# Patient Record
Sex: Male | Born: 1950
Health system: Southern US, Community
[De-identification: ages and names within clinical notes are randomized; demographics above are authoritative.]

## PROBLEM LIST (undated history)

## (undated) DIAGNOSIS — M7500 Adhesive capsulitis of unspecified shoulder: Secondary | ICD-10-CM

## (undated) DIAGNOSIS — E785 Hyperlipidemia, unspecified: Secondary | ICD-10-CM

## (undated) DIAGNOSIS — Z87442 Personal history of urinary calculi: Secondary | ICD-10-CM

## (undated) DIAGNOSIS — M1990 Unspecified osteoarthritis, unspecified site: Secondary | ICD-10-CM

## (undated) DIAGNOSIS — K219 Gastro-esophageal reflux disease without esophagitis: Secondary | ICD-10-CM

## (undated) DIAGNOSIS — C679 Malignant neoplasm of bladder, unspecified: Secondary | ICD-10-CM

## (undated) DIAGNOSIS — T7840XA Allergy, unspecified, initial encounter: Secondary | ICD-10-CM

## (undated) DIAGNOSIS — N189 Chronic kidney disease, unspecified: Secondary | ICD-10-CM

## (undated) DIAGNOSIS — E213 Hyperparathyroidism, unspecified: Secondary | ICD-10-CM

## (undated) DIAGNOSIS — A159 Respiratory tuberculosis unspecified: Secondary | ICD-10-CM

## (undated) DIAGNOSIS — I10 Essential (primary) hypertension: Secondary | ICD-10-CM

## (undated) HISTORY — DX: Hyperlipidemia, unspecified: E78.5

## (undated) HISTORY — PX: POLYPECTOMY: SHX149

## (undated) HISTORY — DX: Respiratory tuberculosis unspecified: A15.9

## (undated) HISTORY — DX: Allergy, unspecified, initial encounter: T78.40XA

## (undated) HISTORY — DX: Essential (primary) hypertension: I10

## (undated) HISTORY — PX: COLONOSCOPY: SHX174

## (undated) HISTORY — DX: Adhesive capsulitis of unspecified shoulder: M75.00

## (undated) HISTORY — PX: WISDOM TOOTH EXTRACTION: SHX21

## (undated) HISTORY — DX: Chronic kidney disease, unspecified: N18.9

## (undated) HISTORY — DX: Gastro-esophageal reflux disease without esophagitis: K21.9

## (undated) HISTORY — DX: Unspecified osteoarthritis, unspecified site: M19.90

---

## 2012-12-04 HISTORY — PX: PROSTATE SURGERY: SHX751

## 2014-11-06 ENCOUNTER — Encounter: Payer: Self-pay | Admitting: Internal Medicine

## 2014-11-06 ENCOUNTER — Ambulatory Visit (INDEPENDENT_AMBULATORY_CARE_PROVIDER_SITE_OTHER): Payer: 59 | Admitting: Internal Medicine

## 2014-11-06 ENCOUNTER — Other Ambulatory Visit: Payer: Self-pay

## 2014-11-06 VITALS — BP 128/82 | HR 53 | Temp 97.8°F | Resp 14 | Ht 69.0 in | Wt 222.4 lb

## 2014-11-06 DIAGNOSIS — Z0000 Encounter for general adult medical examination without abnormal findings: Secondary | ICD-10-CM | POA: Insufficient documentation

## 2014-11-06 DIAGNOSIS — M150 Primary generalized (osteo)arthritis: Secondary | ICD-10-CM

## 2014-11-06 DIAGNOSIS — Z23 Encounter for immunization: Secondary | ICD-10-CM

## 2014-11-06 DIAGNOSIS — E785 Hyperlipidemia, unspecified: Secondary | ICD-10-CM

## 2014-11-06 DIAGNOSIS — I10 Essential (primary) hypertension: Secondary | ICD-10-CM

## 2014-11-06 DIAGNOSIS — M159 Polyosteoarthritis, unspecified: Secondary | ICD-10-CM

## 2014-11-06 DIAGNOSIS — R55 Syncope and collapse: Secondary | ICD-10-CM | POA: Insufficient documentation

## 2014-11-06 DIAGNOSIS — K219 Gastro-esophageal reflux disease without esophagitis: Secondary | ICD-10-CM | POA: Insufficient documentation

## 2014-11-06 DIAGNOSIS — M1990 Unspecified osteoarthritis, unspecified site: Secondary | ICD-10-CM | POA: Insufficient documentation

## 2014-11-06 DIAGNOSIS — Z299 Encounter for prophylactic measures, unspecified: Secondary | ICD-10-CM

## 2014-11-06 DIAGNOSIS — Z418 Encounter for other procedures for purposes other than remedying health state: Secondary | ICD-10-CM

## 2014-11-06 MED ORDER — POTASSIUM CHLORIDE CRYS ER 20 MEQ PO TBCR: 20.0000 meq | EXTENDED_RELEASE_TABLET | Freq: Every day | ORAL | Status: DC

## 2014-11-06 NOTE — Assessment & Plan Note (Signed)
Continue lovastatin. Check lipid panel today and may need adjustment of medication and/or dosing.

## 2014-11-06 NOTE — Assessment & Plan Note (Signed)
BP at goal on lisinopril hydrochlorothiazide. We'll check labs today as he has been off his potassium to see if he continues to need potassium pills.

## 2014-11-06 NOTE — Patient Instructions (Signed)
Patient discharged home during computer downtime, instructions given via verbal order.

## 2014-11-06 NOTE — Assessment & Plan Note (Signed)
Patient up-to-date on colonoscopy. Will check lipid panel, BMP, hemoglobin A1c today. Ordered EKG however unable to be performed so will obtain on Monday.

## 2014-11-06 NOTE — Progress Notes (Signed)
   Subjective:    Patient ID: Hector [name], male    DOB: 11-13-1951, 63 y.o.   MRN: [id number]  HPI The patient is a 63 year old man who comes in to establish care. He has a past medical history arthritis, hypertension, hyperlipidemia, CK D. He is not having any new problems at this time. He has been getting a few muscle aches and attributes that to being out of his potassium pills while still taking his blood pressure medication. He denies any chest pains, shortness of breath. Over the past 2 months he has had 2 episodes where he has had syncope. During one episode he had had a gastroenteritis for the previous 48 hours and was very dehydrated and passed out and fell. The other time he is less able to identify a trigger however he was working, was sleeping and was awakened from sleep suddenly stood up and passed out on the floor. He awoke and was immediately aware of what had happened. He denies any heart palpitations, history of sudden death in the family.  Review of Systems  Constitutional: Negative for fever, activity change, appetite change, fatigue and unexpected weight change.  HENT: Negative.   Eyes: Negative.   Respiratory: Negative for cough, chest tightness, shortness of breath and wheezing.   Cardiovascular: Negative for chest pain, palpitations and leg swelling.  Gastrointestinal: Negative for nausea, abdominal pain, diarrhea, constipation and abdominal distention.  Musculoskeletal: Positive for arthralgias. Negative for back pain and joint swelling.  Skin: Negative.   Neurological: Positive for syncope. Negative for dizziness, seizures, weakness, light-headedness and headaches.      Objective:   Physical Exam  Constitutional: He is oriented to person, place, and time. He appears well-developed and well-nourished.  HENT:  Head: Normocephalic and atraumatic.  Eyes: EOM are normal.  Neck: Normal range of motion.  Cardiovascular: Normal rate and regular rhythm.   No murmur  heard. Pulmonary/Chest: Effort normal and breath sounds normal. No respiratory distress. He has no wheezes. He has no rales.  Abdominal: Soft. Bowel sounds are normal. He exhibits no distension. There is no tenderness. There is no rebound.  Musculoskeletal: He exhibits no edema.  Neurological: He is alert and oriented to person, place, and time. No cranial nerve deficit. Coordination normal.  Skin: Skin is warm and dry.   Filed Vitals:   11/06/14 0944  BP: 128/82  Pulse: 53  Temp: 97.8 F (36.6 C)  TempSrc: Oral  Resp: 14  Height: 5\' 9"  (1.753 m)  Weight: 222 lb 6.4 oz (100.88 kg)  SpO2: 97%   EKG ordered however due to computer downtime were unable to complete. Patient asked to return on Monday to get EKG done.    Assessment & Plan:

## 2014-11-06 NOTE — Progress Notes (Signed)
Pre visit review using our clinic review tool, if applicable. No additional management support is needed unless otherwise documented below in the visit note. 

## 2014-11-06 NOTE — Assessment & Plan Note (Signed)
Patient doing well with occasional meloxicam usage.

## 2014-11-06 NOTE — Assessment & Plan Note (Signed)
Patient uses rare Zantac for GERD.

## 2014-11-06 NOTE — Assessment & Plan Note (Signed)
Patient able to describe to clear episodes of syncope. One is clearly orthostatic after episode of gastroenteritis and likely dehydration. The other episode is less defined and may possibly be vasovagal from the sudden awakening and activation of adrenaline. Ordered EKG which will be done on Monday due to computer downtime. If any abnormalities will refer to cardiology and may need event monitor. No family history of sudden death and no previous history of syncope in his lifetime.

## 2014-11-09 ENCOUNTER — Other Ambulatory Visit (INDEPENDENT_AMBULATORY_CARE_PROVIDER_SITE_OTHER): Payer: 59

## 2014-11-09 DIAGNOSIS — Z0000 Encounter for general adult medical examination without abnormal findings: Secondary | ICD-10-CM

## 2014-11-09 LAB — BASIC METABOLIC PANEL
BUN: 18 mg/dL (ref 6–23)
CHLORIDE: 103 meq/L (ref 96–112)
CO2: 28 meq/L (ref 19–32)
Calcium: 10.7 mg/dL — ABNORMAL HIGH (ref 8.4–10.5)
Creatinine, Ser: 1.2 mg/dL (ref 0.4–1.5)
GFR: 82.08 mL/min (ref >60.00)
GLUCOSE: 77 mg/dL (ref 70–99)
POTASSIUM: 4.2 meq/L (ref 3.5–5.1)
SODIUM: 139 meq/L (ref 135–145)

## 2014-11-09 LAB — LIPID PANEL
CHOL/HDL RATIO: 3
Cholesterol: 126 mg/dL (ref 0–200)
HDL: 43.3 mg/dL (ref >39.00)
LDL Cholesterol: 71 mg/dL (ref 0–99)
NonHDL: 82.7
Triglycerides: 57 mg/dL (ref 0.0–149.0)
VLDL: 11.4 mg/dL (ref 0.0–40.0)

## 2014-11-09 LAB — HEMOGLOBIN A1C: HEMOGLOBIN A1C: 6.2 % (ref 4.6–6.5)

## 2014-11-10 MED ORDER — LOVASTATIN 40 MG PO TABS: 20.0000 mg | ORAL_TABLET | Freq: Every day | ORAL | Status: DC

## 2014-11-10 MED ORDER — LISINOPRIL-HYDROCHLOROTHIAZIDE 20-12.5 MG PO TABS: 1.0000 | ORAL_TABLET | Freq: Two times a day (BID) | ORAL | Status: DC

## 2014-11-10 MED ORDER — POTASSIUM CHLORIDE CRYS ER 20 MEQ PO TBCR: 20.0000 meq | EXTENDED_RELEASE_TABLET | Freq: Every day | ORAL | Status: DC

## 2014-11-10 NOTE — Addendum Note (Signed)
Addended by: Vertell Novak A on: 11/10/2014 09:33 AM   Modules accepted: Orders

## 2014-11-10 NOTE — Addendum Note (Signed)
Addended by: Resa Miner R on: 11/10/2014 09:27 AM   Modules accepted: Orders

## 2014-11-30 ENCOUNTER — Encounter: Payer: Self-pay | Admitting: Family

## 2014-11-30 ENCOUNTER — Ambulatory Visit (INDEPENDENT_AMBULATORY_CARE_PROVIDER_SITE_OTHER): Payer: 59 | Admitting: Family

## 2014-11-30 VITALS — BP 158/110 | HR 64 | Temp 97.8°F | Resp 16 | Ht 69.0 in | Wt 222.6 lb

## 2014-11-30 DIAGNOSIS — M25512 Pain in left shoulder: Secondary | ICD-10-CM

## 2014-11-30 DIAGNOSIS — M7590 Shoulder lesion, unspecified, unspecified shoulder: Secondary | ICD-10-CM | POA: Insufficient documentation

## 2014-11-30 MED ORDER — METHYLPREDNISOLONE (PAK) 4 MG PO TABS: ORAL_TABLET | ORAL | Status: DC

## 2014-11-30 MED ORDER — METHOCARBAMOL 500 MG PO TABS: 500.0000 mg | ORAL_TABLET | Freq: Four times a day (QID) | ORAL | Status: DC

## 2014-11-30 NOTE — Assessment & Plan Note (Signed)
Symptoms are most likely related to osteoarthritis, however patient does have muscle spasm of left shoulder noted. Continue taking the meloxicam as previously prescribed. Start Robaxin as needed. Start Medrol Dosepak for inflammation. If symptoms do not improve with current treatments, consider imaging and referral to sports medicine for potential injection.

## 2014-11-30 NOTE — Progress Notes (Signed)
Pre visit review using our clinic review tool, if applicable. No additional management support is needed unless otherwise documented below in the visit note. 

## 2014-11-30 NOTE — Patient Instructions (Addendum)
Thank you for choosing [facility].  Summary/Instructions:  Your prescription(s) have been submitted to your pharmacy. Please take as directed and contact our office if you believe you are having problem(s) with the medication(s).  If your symptoms worsen or fail to improve, please contact our office for further instruction, or in case of emergency go directly to the emergency room at the closest medical facility.   Continue to ice and heat as needed. Continue the Mobic as prescribed. Continue OTC icy/hots or Bengay.

## 2014-11-30 NOTE — Progress Notes (Signed)
   Subjective:    Patient ID: Hector [name], male    DOB: 11-16-1951, 63 y.o.   MRN: [id number]  Chief Complaint  Patient presents with  . Shoulder Pain    left pain x 4 days    HPI:  Hector [name] is a 63 y.o. male who presents today for an acute visit.  Acute symptoms of generalized left shoulder pain started about 4 days ago. Indicates he did have a fainting spell before Thanksgiving, but did not have pain at the time. Describes the pain as achy and at worst is about a 9/10. It is worse first thing in the morning and laying flat and trying to get up. Has tried Bengay and Tylenol which have provided minimal relief. Denies any trauma or incidence to either shoulder. Denies any sounds or sensations heard or felt. Has a history of arthritis in the left shoulder.   Allergies  Allergen Reactions  . Shellfish Allergy Hives    Current Outpatient Prescriptions on File Prior to Visit  Medication Sig Dispense Refill  . aspirin 81 MG tablet Take 81 mg by mouth daily.    . cetirizine (ZYRTEC) 10 MG tablet Take 10 mg by mouth daily.    . Cholecalciferol (VITAMIN D3) 5000 UNITS CAPS Take by mouth.    Marland Kitchen lisinopril-hydrochlorothiazide (PRINZIDE,ZESTORETIC) 20-12.5 MG per tablet Take 1 tablet by mouth 2 (two) times daily. 180 tablet 3  . lovastatin (MEVACOR) 40 MG tablet Take 0.5 tablets (20 mg total) by mouth at bedtime. 45 tablet 3  . meloxicam (MOBIC) 15 MG tablet Take 15 mg by mouth daily.    . potassium chloride SA (K-DUR,KLOR-CON) 20 MEQ tablet Take 1 tablet (20 mEq total) by mouth daily. 90 tablet 3  . ranitidine (ZANTAC) 300 MG tablet Take 150 mg by mouth 2 (two) times daily.    Marland Kitchen terazosin (HYTRIN) 10 MG capsule Take 10 mg by mouth at bedtime.     No current facility-administered medications on file prior to visit.    Review of Systems    See HPI  Objective:    BP 158/110 mmHg  Pulse 64  Temp(Src) 97.8 F (36.6 C) (Oral)  Resp 16  Ht 5\' 9"  (1.753 m)  Wt 222 lb 9.6 oz  (100.971 kg)  BMI 32.86 kg/m2  SpO2 99% Nursing note and vital signs reviewed.  Physical Exam  Constitutional: He is oriented to person, place, and time. He appears well-developed and well-nourished. No distress.  Cardiovascular: Normal rate, regular rhythm, normal heart sounds and intact distal pulses.   Pulmonary/Chest: Effort normal and breath sounds normal.  Musculoskeletal:  No obvious deformity, discoloration, or edema noted of left shoulder. Denies tenderness of cervical spine. Tenderness elicited along the upper trapezius and along course of supraspinatus muscle. Range of motion is intact and appropriate bilaterally. Pulses are present intact bilaterally. Negative apprehension, Neer's impingement and empty can.   Neurological: He is alert and oriented to person, place, and time.  Skin: Skin is warm and dry.  Psychiatric: He has a normal mood and affect. His behavior is normal. Judgment and thought content normal.       Assessment & Plan:

## 2014-12-29 ENCOUNTER — Ambulatory Visit (INDEPENDENT_AMBULATORY_CARE_PROVIDER_SITE_OTHER): Admitting: Internal Medicine

## 2014-12-29 ENCOUNTER — Encounter: Payer: Self-pay | Admitting: Internal Medicine

## 2014-12-29 VITALS — BP 120/90 | HR 62 | Temp 98.7°F | Resp 18 | Ht 69.0 in | Wt 221.0 lb

## 2014-12-29 DIAGNOSIS — R1310 Dysphagia, unspecified: Secondary | ICD-10-CM

## 2014-12-29 DIAGNOSIS — K219 Gastro-esophageal reflux disease without esophagitis: Secondary | ICD-10-CM

## 2014-12-29 DIAGNOSIS — R1314 Dysphagia, pharyngoesophageal phase: Secondary | ICD-10-CM | POA: Diagnosis not present

## 2014-12-29 MED ORDER — PANTOPRAZOLE SODIUM 40 MG PO TBEC: 40.0000 mg | DELAYED_RELEASE_TABLET | Freq: Every day | ORAL | Status: DC

## 2014-12-29 NOTE — Progress Notes (Signed)
Pre visit review using our clinic review tool, if applicable. No additional management support is needed unless otherwise documented below in the visit note. 

## 2014-12-29 NOTE — Assessment & Plan Note (Signed)
Concern for damage from GERD and/or narrowing of the esophagus. Refer to GI for possible EGD. Start more aggressive treatment of his GERD.

## 2014-12-29 NOTE — Progress Notes (Signed)
   Subjective:    Patient ID: Hector [name], male    DOB: [birth year], 64 y.o.   MRN: [id number]  HPI The patient is a 64 YO man who is coming in to follow up on his stomach troubles. He was hospitalized at the [facility] for the same vomiting and it has improved but he is still feeling like foods get stuck in his throat and he needs to vomit them up. He has changed his diet to include only soft foods and this has helped. He still has a lot of gas in his stomach. He is only using ranitidine sometimes and isn't sure that it helps. He denies weight loss, blood in his stool. He is not having diarrhea.   Review of Systems  Constitutional: Positive for appetite change. Negative for fever, activity change, fatigue and unexpected weight change.  Respiratory: Negative for cough, chest tightness, shortness of breath and wheezing.   Cardiovascular: Negative for chest pain, palpitations and leg swelling.  Gastrointestinal: Positive for nausea and abdominal distention. Negative for abdominal pain, diarrhea and constipation.       Dysphagia  Musculoskeletal: Positive for arthralgias.       Mild in left shoulder      Objective:   Physical Exam  Constitutional: He is oriented to person, place, and time. He appears well-developed and well-nourished.  HENT:  Head: Normocephalic and atraumatic.  Eyes: EOM are normal.  Neck: Normal range of motion.  Cardiovascular: Normal rate and regular rhythm.   Pulmonary/Chest: Effort normal and breath sounds normal. No respiratory distress. He has no wheezes.  Abdominal: Soft. Bowel sounds are normal. He exhibits no distension. There is no tenderness. There is no rebound.  Neurological: He is alert and oriented to person, place, and time. Coordination normal.  Skin: Skin is warm and dry.   Filed Vitals:   12/29/14 1029  BP: 120/90  Pulse: 62  Temp: 98.7 F (37.1 C)  TempSrc: Oral  Resp: 18  Height: 5\' 9"  (1.753 m)  Weight: 221 lb (100.245 kg)  SpO2: 98%        Assessment & Plan:

## 2014-12-29 NOTE — Assessment & Plan Note (Signed)
Will ask patient to start taking PPI daily as previously he stated he only used zantac rarely. Given his symptoms will increase therapy.

## 2014-12-29 NOTE — Patient Instructions (Signed)
We have sent in a medicine called protonix which you will take once a day. We will also send you to the stomach doctor to see if they need to do the upper endoscopy. If they find a problem usually they can fix it while doing the procedure.  Food Choices for Gastroesophageal Reflux Disease When you have gastroesophageal reflux disease (GERD), the foods you eat and your eating habits are very important. Choosing the right foods can help ease the discomfort of GERD. WHAT GENERAL GUIDELINES DO I NEED TO FOLLOW?  Choose fruits, vegetables, whole grains, low-fat dairy products, and low-fat meat, fish, and poultry.  Limit fats such as oils, salad dressings, butter, nuts, and avocado.  Keep a food diary to identify foods that cause symptoms.  Avoid foods that cause reflux. These may be different for different people.  Eat frequent small meals instead of three large meals each day.  Eat your meals slowly, in a relaxed setting.  Limit fried foods.  Cook foods using methods other than frying.  Avoid drinking alcohol.  Avoid drinking large amounts of liquids with your meals.  Avoid bending over or lying down until 2-3 hours after eating. WHAT FOODS ARE NOT RECOMMENDED? The following are some foods and drinks that may worsen your symptoms: Vegetables Tomatoes. Tomato juice. Tomato and spaghetti sauce. Chili peppers. Onion and garlic. Horseradish. Fruits Oranges, grapefruit, and lemon (fruit and juice). Meats High-fat meats, fish, and poultry. This includes hot dogs, ribs, ham, sausage, salami, and bacon. Dairy Whole milk and chocolate milk. Sour cream. Cream. Butter. Ice cream. Cream cheese.  Beverages Coffee and tea, with or without caffeine. Carbonated beverages or energy drinks. Condiments Hot sauce. Barbecue sauce.  Sweets/Desserts Chocolate and cocoa. Donuts. Peppermint and spearmint. Fats and Oils High-fat foods, including [place] fries and potato chips. Other Vinegar. Strong  spices, such as black pepper, white pepper, red pepper, cayenne, curry powder, cloves, ginger, and chili powder. The items listed above may not be a complete list of foods and beverages to avoid. Contact your dietitian for more information. Document Released: 11/20/2005 Document Revised: 11/25/2013 Document Reviewed: 09/24/2013 [facility] Patient Information 2015 [place], [place]. This information is not intended to replace advice given to you by your health care provider. Make sure you discuss any questions you have with your health care provider.

## 2015-01-11 ENCOUNTER — Ambulatory Visit (INDEPENDENT_AMBULATORY_CARE_PROVIDER_SITE_OTHER): Admitting: Geriatric Medicine

## 2015-01-11 DIAGNOSIS — Z111 Encounter for screening for respiratory tuberculosis: Secondary | ICD-10-CM | POA: Diagnosis not present

## 2015-01-13 LAB — TB SKIN TEST
Induration: 0 mm
TB SKIN TEST: NEGATIVE

## 2015-01-14 ENCOUNTER — Encounter: Payer: Self-pay | Admitting: Gastroenterology

## 2015-02-09 ENCOUNTER — Ambulatory Visit (INDEPENDENT_AMBULATORY_CARE_PROVIDER_SITE_OTHER): Admitting: Internal Medicine

## 2015-02-09 ENCOUNTER — Encounter: Payer: Self-pay | Admitting: Internal Medicine

## 2015-02-09 VITALS — BP 134/86 | HR 62 | Temp 97.8°F | Resp 16 | Wt 223.0 lb

## 2015-02-09 DIAGNOSIS — J0110 Acute frontal sinusitis, unspecified: Secondary | ICD-10-CM

## 2015-02-09 DIAGNOSIS — J329 Chronic sinusitis, unspecified: Secondary | ICD-10-CM | POA: Insufficient documentation

## 2015-02-09 MED ORDER — FLUTICASONE PROPIONATE 50 MCG/ACT NA SUSP: 2.0000 | Freq: Every day | NASAL | Status: DC

## 2015-02-09 MED ORDER — AMOXICILLIN-POT CLAVULANATE 875-125 MG PO TABS: 1.0000 | ORAL_TABLET | Freq: Two times a day (BID) | ORAL | Status: DC

## 2015-02-09 NOTE — Patient Instructions (Addendum)
We have sent in augmentin which is an antibiotic. Take 1 pill twice a day for 1 week to clear up the sinuses.   We have also sent in a nose spray which is a steroid to dry up the sinuses faster. Use 2 puffs in each nostril once a day for the next 1-2 weeks. It is called flonase.   Sinusitis Sinusitis is redness, soreness, and inflammation of the paranasal sinuses. Paranasal sinuses are air pockets within the bones of your face (beneath the eyes, the middle of the forehead, or above the eyes). In healthy paranasal sinuses, mucus is able to drain out, and air is able to circulate through them by way of your nose. However, when your paranasal sinuses are inflamed, mucus and air can become trapped. This can allow bacteria and other germs to grow and cause infection. Sinusitis can develop quickly and last only a short time (acute) or continue over a long period (chronic). Sinusitis that lasts for more than 12 weeks is considered chronic.  CAUSES  Causes of sinusitis include:  Allergies.  Structural abnormalities, such as displacement of the cartilage that separates your nostrils (deviated septum), which can decrease the air flow through your nose and sinuses and affect sinus drainage.  Functional abnormalities, such as when the small hairs (cilia) that line your sinuses and help remove mucus do not work properly or are not present. SIGNS AND SYMPTOMS  Symptoms of acute and chronic sinusitis are the same. The primary symptoms are pain and pressure around the affected sinuses. Other symptoms include:  Upper toothache.  Earache.  Headache.  Bad breath.  Decreased sense of smell and taste.  A cough, which worsens when you are lying flat.  Fatigue.  Fever.  Thick drainage from your nose, which often is green and may contain pus (purulent).  Swelling and warmth over the affected sinuses. DIAGNOSIS  Your health care provider will perform a physical exam. During the exam, your health care  provider may:  Look in your nose for signs of abnormal growths in your nostrils (nasal polyps).  Tap over the affected sinus to check for signs of infection.  View the inside of your sinuses (endoscopy) using an imaging device that has a light attached (endoscope). If your health care provider suspects that you have chronic sinusitis, one or more of the following tests may be recommended:  Allergy tests.  Nasal culture. A sample of mucus is taken from your nose, sent to a lab, and screened for bacteria.  Nasal cytology. A sample of mucus is taken from your nose and examined by your health care provider to determine if your sinusitis is related to an allergy. TREATMENT  Most cases of acute sinusitis are related to a viral infection and will resolve on their own within 10 days. Sometimes medicines are prescribed to help relieve symptoms (pain medicine, decongestants, nasal steroid sprays, or saline sprays).  However, for sinusitis related to a bacterial infection, your health care provider will prescribe antibiotic medicines. These are medicines that will help kill the bacteria causing the infection.  Rarely, sinusitis is caused by a fungal infection. In theses cases, your health care provider will prescribe antifungal medicine. For some cases of chronic sinusitis, surgery is needed. Generally, these are cases in which sinusitis recurs more than 3 times per year, despite other treatments. HOME CARE INSTRUCTIONS   Drink plenty of water. Water helps thin the mucus so your sinuses can drain more easily.  Use a humidifier.  Inhale steam  3 to 4 times a day (for example, sit in the bathroom with the shower running).  Apply a warm, moist washcloth to your face 3 to 4 times a day, or as directed by your health care provider.  Use saline nasal sprays to help moisten and clean your sinuses.  Take medicines only as directed by your health care provider.  If you were prescribed either an  antibiotic or antifungal medicine, finish it all even if you start to feel better. SEEK IMMEDIATE MEDICAL CARE IF:  You have increasing pain or severe headaches.  You have nausea, vomiting, or drowsiness.  You have swelling around your face.  You have vision problems.  You have a stiff neck.  You have difficulty breathing. MAKE SURE YOU:   Understand these instructions.  Will watch your condition.  Will get help right away if you are not doing well or get worse. Document Released: 11/20/2005 Document Revised: 04/06/2014 Document Reviewed: 12/05/2011 [facility] Patient Information 2015 [place], [place]. This information is not intended to replace advice given to you by your health care provider. Make sure you discuss any questions you have with your health care provider.

## 2015-02-09 NOTE — Progress Notes (Signed)
   Subjective:    Patient ID: Hector [name], male    DOB: Jan 28, 1951, 64 y.o.   MRN: [id number]  HPI The patient is a 64 Yo man who is coming in for sinus problems. He has been struggling with it for 1-2 weeks. He is now having some fevers. He is having a productive cough and minimal sore throat. He is having some facial tenderness. He is still taking zyrtec which he is not sure is helping. Is having left ear pain and soreness into his jaw and neck.   Review of Systems  Constitutional: Positive for fever. Negative for chills, activity change, appetite change, fatigue and unexpected weight change.  HENT: Positive for congestion, ear pain, postnasal drip, rhinorrhea and sinus pressure. Negative for facial swelling and sore throat.   Eyes: Negative.   Respiratory: Positive for cough. Negative for chest tightness, shortness of breath and wheezing.   Cardiovascular: Negative for chest pain, palpitations and leg swelling.  Gastrointestinal: Negative.       Objective:   Physical Exam  Constitutional: He appears well-developed and well-nourished.  HENT:  Head: Normocephalic and atraumatic.  Bilateral ears TMs normal but with erythema in the canal, nasal turbinates swollen and oropharynx with erythema no purulent discharge.   Eyes: EOM are normal.  Neck: Normal range of motion.  Cardiovascular: Normal rate and regular rhythm.   Pulmonary/Chest: Effort normal and breath sounds normal. No respiratory distress. He has no wheezes. He has no rales.  Abdominal: Soft.  Lymphadenopathy:    He has cervical adenopathy.   Filed Vitals:   02/09/15 1020  BP: 134/86  Pulse: 62  Temp: 97.8 F (36.6 C)  TempSrc: Oral  Resp: 16  Weight: 223 lb (101.152 kg)  SpO2: 98%      Assessment & Plan:

## 2015-02-09 NOTE — Assessment & Plan Note (Signed)
Treat with augmentin and add flonase as well as the zyrtec. He will call back if not improved.

## 2015-02-09 NOTE — Progress Notes (Signed)
Pre visit review using our clinic review tool, if applicable. No additional management support is needed unless otherwise documented below in the visit note. 

## 2015-02-11 ENCOUNTER — Telehealth: Payer: Self-pay | Admitting: Internal Medicine

## 2015-02-11 NOTE — Telephone Encounter (Signed)
rec'd records for Dr.Spiegelman MD., Forwarding 12pages to Dr.Kollar

## 2015-02-19 ENCOUNTER — Other Ambulatory Visit: Payer: Self-pay | Admitting: Internal Medicine

## 2015-02-23 ENCOUNTER — Other Ambulatory Visit: Payer: Self-pay | Admitting: Nurse Practitioner

## 2015-02-23 ENCOUNTER — Other Ambulatory Visit: Payer: Self-pay | Admitting: Obstetrics & Gynecology

## 2015-02-23 DIAGNOSIS — E213 Hyperparathyroidism, unspecified: Secondary | ICD-10-CM

## 2015-03-01 ENCOUNTER — Ambulatory Visit
Admission: RE | Admit: 2015-03-01 | Discharge: 2015-03-01 | Disposition: A | Discharge status: Home / Self Care | Payer: Non-veteran care | Source: Ambulatory Visit | Attending: Nurse Practitioner | Admitting: Nurse Practitioner

## 2015-03-01 DIAGNOSIS — E213 Hyperparathyroidism, unspecified: Secondary | ICD-10-CM

## 2015-03-03 ENCOUNTER — Encounter: Payer: Self-pay | Admitting: Gastroenterology

## 2015-03-03 ENCOUNTER — Other Ambulatory Visit: Payer: Self-pay | Admitting: Geriatric Medicine

## 2015-03-03 ENCOUNTER — Ambulatory Visit (INDEPENDENT_AMBULATORY_CARE_PROVIDER_SITE_OTHER): Payer: TRICARE For Life (TFL) | Admitting: Gastroenterology

## 2015-03-03 VITALS — BP 146/90 | HR 64 | Ht 69.0 in | Wt 223.1 lb

## 2015-03-03 DIAGNOSIS — Z86010 Personal history of colonic polyps: Secondary | ICD-10-CM

## 2015-03-03 DIAGNOSIS — R1310 Dysphagia, unspecified: Secondary | ICD-10-CM | POA: Diagnosis not present

## 2015-03-03 DIAGNOSIS — K219 Gastro-esophageal reflux disease without esophagitis: Secondary | ICD-10-CM | POA: Diagnosis not present

## 2015-03-03 MED ORDER — FLUTICASONE PROPIONATE 50 MCG/ACT NA SUSP
2.0000 | Freq: Every day | NASAL | Status: DC
Start: 2015-03-03 — End: 2015-09-02

## 2015-03-03 NOTE — Assessment & Plan Note (Signed)
Symptoms are well-controlled with Protonix and Zantac.  Will attempt to stop Zantac

## 2015-03-03 NOTE — Assessment & Plan Note (Signed)
Rule out early esophageal stricture  Recommendations #1 EGD with dilation as indicated

## 2015-03-03 NOTE — Progress Notes (Signed)
_                                                                                                                History of Present Illness:  Hector [name] is a pleasant 64 year old Afro-American male referred at the request of Dr.Kollar for evaluation of dysphagia.  He's having mild dysphagia to solids.  He also has a history of pyrosis which is well-controlled with Protonix and Zantac.  He has very mild odynophagia.  A 4-5 mm colon polyp was removed 2 years ago.  No specimen was retrieved.    Past Medical History  Diagnosis Date  . Arthritis   . Hypertension   . Hyperlipidemia   . Chronic kidney disease   . Tuberculosis    Past Surgical History  Procedure Laterality Date  . Prostate surgery  12/2012   family history includes Arthritis in his father and mother; Breast cancer in his sister; Colon polyps in his mother; Gallbladder disease in his sister; Heart disease in his father and mother; Hyperlipidemia in his father and mother; Hypertension in his other; Kidney disease in his father; Ovarian cancer in his sister; Stomach cancer in his mother. Current Outpatient Prescriptions  Medication Sig Dispense Refill  . aspirin 81 MG tablet Take 81 mg by mouth daily.    . cetirizine (ZYRTEC) 10 MG tablet Take 10 mg by mouth daily.    . Cholecalciferol (VITAMIN D3) 5000 UNITS CAPS Take by mouth.    . fluticasone (FLONASE) 50 MCG/ACT nasal spray Place 2 sprays into both nostrils daily. 16 g 6  . lisinopril-hydrochlorothiazide (PRINZIDE,ZESTORETIC) 20-12.5 MG per tablet Take 1 tablet by mouth 2 (two) times daily. 180 tablet 3  . lovastatin (MEVACOR) 40 MG tablet Take 0.5 tablets (20 mg total) by mouth at bedtime. 45 tablet 3  . meloxicam (MOBIC) 15 MG tablet Take 15 mg by mouth daily.    . pantoprazole (PROTONIX) 40 MG tablet Take 1 tablet (40 mg total) by mouth daily. 30 tablet 3  . potassium chloride SA (K-DUR,KLOR-CON) 20 MEQ tablet Take 1 tablet (20 mEq total) by mouth daily.  90 tablet 3  . ranitidine (ZANTAC) 300 MG tablet Take 150 mg by mouth 2 (two) times daily.    Marland Kitchen terazosin (HYTRIN) 10 MG capsule Take 10 mg by mouth at bedtime.     No current facility-administered medications for this visit.   Allergies as of 03/03/2015 - Review Complete 03/03/2015  Allergen Reaction Noted  . Shellfish allergy Hives 11/30/2014    reports that he has never smoked. He has never used smokeless tobacco. He reports that he does not drink alcohol or use illicit drugs.   Review of Systems: Pertinent positive and negative review of systems were noted in the above HPI section. All other review of systems were otherwise negative.  Vital signs were reviewed in today's medical record Physical Exam: General: Well developed , well nourished, no acute distress Skin: anicteric Head: Normocephalic and atraumatic Eyes:  sclerae anicteric, EOMI Ears: Normal auditory  acuity Mouth: No deformity or lesions Neck: Supple, no masses or thyromegaly Lungs: Clear throughout to auscultation Heart: Regular rate and rhythm; no murmurs, rubs or bruits Abdomen: Soft, non tender and non distended. No masses, hepatosplenomegaly or hernias noted. Normal Bowel sounds Rectal:deferred Musculoskeletal: Symmetrical with no gross deformities  Skin: No lesions on visible extremities Pulses:  Normal pulses noted Extremities: No clubbing, cyanosis, edema or deformities noted Neurological: Alert oriented x 4, grossly nonfocal Cervical Nodes:  No significant cervical adenopathy Inguinal Nodes: No significant inguinal adenopathy Psychological:  Alert and cooperative. Normal mood and affect  See Assessment and Plan under Problem List

## 2015-03-03 NOTE — Assessment & Plan Note (Signed)
Follow-up colonoscopy in 2019

## 2015-03-03 NOTE — Patient Instructions (Signed)

## 2015-03-15 ENCOUNTER — Telehealth: Payer: Self-pay | Admitting: Internal Medicine

## 2015-03-15 ENCOUNTER — Other Ambulatory Visit: Payer: Self-pay | Admitting: Geriatric Medicine

## 2015-03-15 MED ORDER — POTASSIUM CHLORIDE CRYS ER 20 MEQ PO TBCR: 20.0000 meq | EXTENDED_RELEASE_TABLET | Freq: Every day | ORAL | Status: DC

## 2015-03-15 MED ORDER — PANTOPRAZOLE SODIUM 40 MG PO TBEC: 40.0000 mg | DELAYED_RELEASE_TABLET | Freq: Every day | ORAL | Status: DC

## 2015-03-15 NOTE — Telephone Encounter (Signed)
Sent to pharmacy 

## 2015-03-15 NOTE — Telephone Encounter (Signed)
Patient is requesting potassium chloride and pantoprazole [facility]

## 2015-04-06 ENCOUNTER — Ambulatory Visit (AMBULATORY_SURGERY_CENTER): Admitting: Gastroenterology

## 2015-04-06 ENCOUNTER — Encounter: Payer: Self-pay | Admitting: Gastroenterology

## 2015-04-06 VITALS — BP 117/66 | HR 53 | Temp 96.6°F | Resp 17 | Ht 69.0 in | Wt 223.0 lb

## 2015-04-06 DIAGNOSIS — Q394 Esophageal web: Secondary | ICD-10-CM

## 2015-04-06 DIAGNOSIS — K222 Esophageal obstruction: Secondary | ICD-10-CM

## 2015-04-06 DIAGNOSIS — R1310 Dysphagia, unspecified: Secondary | ICD-10-CM

## 2015-04-06 MED ORDER — SODIUM CHLORIDE 0.9 % IV SOLN: 500.0000 mL | INTRAVENOUS | Status: DC

## 2015-04-06 NOTE — Patient Instructions (Signed)
Discharge instructions given. Normal exam. Handout on a dilatation diet. Resume previous medications. YOU HAD AN ENDOSCOPIC PROCEDURE TODAY AT [facility]:   Refer to the procedure report that was given to you for any specific questions about what was found during the examination.  If the procedure report does not answer your questions, please call your gastroenterologist to clarify.  If you requested that your care partner not be given the details of your procedure findings, then the procedure report has been included in a sealed envelope for you to review at your convenience later.  YOU SHOULD EXPECT: Some feelings of bloating in the abdomen. Passage of more gas than usual.  Walking can help get rid of the air that was put into your GI tract during the procedure and reduce the bloating. If you had a lower endoscopy (such as a colonoscopy or flexible sigmoidoscopy) you may notice spotting of blood in your stool or on the toilet paper. If you underwent a bowel prep for your procedure, you may not have a normal bowel movement for a few days.  Please Note:  You might notice some irritation and congestion in your nose or some drainage.  This is from the oxygen used during your procedure.  There is no need for concern and it should clear up in a day or so.  SYMPTOMS TO REPORT IMMEDIATELY:    Following upper endoscopy (EGD)  Vomiting of blood or coffee ground material  New chest pain or pain under the shoulder blades  Painful or persistently difficult swallowing  New shortness of breath  Fever of 100F or higher  Black, tarry-looking stools  For urgent or emergent issues, a gastroenterologist can be reached at any hour by calling [phone].   DIET: Your first meal following the procedure should be a small meal and then it is ok to progress to your normal diet. Heavy or fried foods are harder to digest and may make you feel nauseous or bloated.  Likewise, meals heavy in dairy  and vegetables can increase bloating.  Drink plenty of fluids but you should avoid alcoholic beverages for 24 hours.  ACTIVITY:  You should plan to take it easy for the rest of today and you should NOT DRIVE or use heavy machinery until tomorrow (because of the sedation medicines used during the test).    FOLLOW UP: Our staff will call the number listed on your records the next business day following your procedure to check on you and address any questions or concerns that you may have regarding the information given to you following your procedure. If we do not reach you, we will leave a message.  However, if you are feeling well and you are not experiencing any problems, there is no need to return our call.  We will assume that you have returned to your regular daily activities without incident.  If any biopsies were taken you will be contacted by phone or by letter within the next 1-3 weeks.  Please call us at [phone] if you have not heard about the biopsies in 3 weeks.    SIGNATURES/CONFIDENTIALITY: You and/or your care partner have signed paperwork which will be entered into your electronic medical record.  These signatures attest to the fact that that the information above on your After Visit Summary has been reviewed and is understood.  Full responsibility of the confidentiality of this discharge information lies with you and/or your care-partner.

## 2015-04-06 NOTE — Progress Notes (Signed)
Stable to RR 

## 2015-04-06 NOTE — Progress Notes (Signed)
Called to room to assist during endoscopic procedure.  Patient ID and intended procedure confirmed with present staff. Received instructions for my participation in the procedure from the performing physician.  

## 2015-04-06 NOTE — Op Note (Signed)
[facility]  [address] [place], [id number]   ENDOSCOPY PROCEDURE REPORT  PATIENT: Hector [name], Hector [name]  MR#: [id number] BIRTHDATE: 1951-07-10 , 73  yrs. old GENDER: male ENDOSCOPIST: Inda Castle, MD REFERRED BY:  Vertell Novak, M.D. PROCEDURE DATE:  04/06/2015 PROCEDURE:  EGD, diagnostic and Maloney dilation of esophagus ASA CLASS:     Class II INDICATIONS:  dysphagia. MEDICATIONS: Monitored anesthesia care, Propofol 120 mg IV, and Simethicone 40mg  PO TOPICAL ANESTHETIC:  DESCRIPTION OF PROCEDURE: After the risks benefits and alternatives of the procedure were thoroughly explained, informed consent was obtained.  The LB [id number] endoscope was introduced through the mouth and advanced to the second portion of the duodenum , Without limitations.  The instrument was slowly withdrawn as the mucosa was fully examined.    ESOPHAGUS: A mild Schatzki ring was found at the gastroesophageal junction.  The stricture was dilated using a 48mm (52Fr) Maloney dilator.  There was mild resistance but no heme. Except for the findings listed, the EGD was otherwise normal.  Retroflexed views revealed no abnormalities.     The scope was then withdrawn from the patient and the procedure completed.  COMPLICATIONS: There were no immediate complications.  ENDOSCOPIC IMPRESSION: 1.   Schatzki ring was found at the gastroesophageal junction; The stricture was dilated using a 79mm (52Fr) Maloney dilator 2.   EGD was otherwise normal  RECOMMENDATIONS: repeat dilations as needed  REPEAT EXAM:  eSigned:  Inda Castle, MD 04/06/2015 3:13 PM    CC:

## 2015-04-07 ENCOUNTER — Telehealth: Payer: Self-pay

## 2015-04-07 NOTE — Telephone Encounter (Signed)
  Follow up Call-  Call back number 04/06/2015  Post procedure Call Back phone  # [phone]  Permission to leave phone message Yes     Patient questions:  Do you have a fever, pain , or abdominal swelling? No. Pain Score  0 *  Have you tolerated food without any problems? Yes.    Have you been able to return to your normal activities? Yes.    Do you have any questions about your discharge instructions: Diet   No. Medications  No. Follow up visit  No.  Do you have questions or concerns about your Care? No.  Actions: * If pain score is 4 or above: No action needed, pain <4.

## 2015-04-09 ENCOUNTER — Ambulatory Visit (INDEPENDENT_AMBULATORY_CARE_PROVIDER_SITE_OTHER): Payer: TRICARE For Life (TFL) | Admitting: Internal Medicine

## 2015-04-09 ENCOUNTER — Ambulatory Visit: Payer: Non-veteran care | Admitting: Internal Medicine

## 2015-04-09 ENCOUNTER — Other Ambulatory Visit (INDEPENDENT_AMBULATORY_CARE_PROVIDER_SITE_OTHER): Payer: TRICARE For Life (TFL)

## 2015-04-09 ENCOUNTER — Encounter: Payer: Self-pay | Admitting: Internal Medicine

## 2015-04-09 VITALS — BP 140/100 | HR 56 | Temp 97.6°F | Resp 15 | Ht 69.0 in | Wt 222.0 lb

## 2015-04-09 DIAGNOSIS — E559 Vitamin D deficiency, unspecified: Secondary | ICD-10-CM | POA: Diagnosis not present

## 2015-04-09 DIAGNOSIS — M542 Cervicalgia: Secondary | ICD-10-CM

## 2015-04-09 DIAGNOSIS — I10 Essential (primary) hypertension: Secondary | ICD-10-CM

## 2015-04-09 LAB — VITAMIN D 25 HYDROXY (VIT D DEFICIENCY, FRACTURES): VITD: 51.82 ng/mL (ref 30.00–100.00)

## 2015-04-09 MED ORDER — GABAPENTIN 100 MG PO CAPS
ORAL_CAPSULE | ORAL | Status: DC
Start: 2015-04-09 — End: 2016-03-24

## 2015-04-09 MED ORDER — CYCLOBENZAPRINE HCL 5 MG PO TABS
ORAL_TABLET | ORAL | Status: DC
Start: 2015-04-09 — End: 2016-03-24

## 2015-04-09 NOTE — Progress Notes (Signed)
Pre visit review using our clinic review tool, if applicable. No additional management support is needed unless otherwise documented below in the visit note. 

## 2015-04-09 NOTE — Patient Instructions (Signed)
Use a cervical memory foam pillow to prevent hyperextension or hyperflexion of the cervical spine.Use an anti-inflammatory cream such as Aspercreme or Zostrix cream twice a day to the affected area as needed. In lieu of this warm moist compresses or  hot water bottle can be used. Do not apply ice . Use an anti-inflammatory cream such as Aspercreme or Zostrix cream twice a day to the affected area as needed. In lieu of this warm moist compresses or  hot water bottle can be used. Do not apply ice .  Minimal Blood Pressure Goal= AVERAGE < 140/90;  Ideal is an AVERAGE < 135/85. This AVERAGE should be calculated from @ least 5-7 BP readings taken @ different times of day on different days of week. You should not respond to isolated BP readings , but rather the AVERAGE for that week .Please bring your  blood pressure cuff to office visits to verify that it is reliable.It  can also be checked against the blood pressure device at the pharmacy. Finger or wrist cuffs are not dependable; an arm cuff is.  Your next office appointment will be determined based upon review of your pending labs  . Those instructions will be transmitted to you by mail for your records.  Critical results will be called. Followup as needed for any active or acute issue. Please report any significant change in your symptoms.
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
## 2015-04-12 LAB — PTH, INTACT AND CALCIUM
Calcium: 11.2 mg/dL — ABNORMAL HIGH (ref 8.4–10.5)
PTH: 86 pg/mL — ABNORMAL HIGH (ref 14–64)

## 2015-04-13 ENCOUNTER — Other Ambulatory Visit: Payer: Self-pay | Admitting: Internal Medicine

## 2015-04-13 DIAGNOSIS — E213 Hyperparathyroidism, unspecified: Secondary | ICD-10-CM | POA: Insufficient documentation

## 2015-04-16 ENCOUNTER — Ambulatory Visit: Payer: TRICARE For Life (TFL) | Admitting: Endocrinology

## 2015-04-28 ENCOUNTER — Encounter: Payer: Self-pay | Admitting: Endocrinology

## 2015-04-28 ENCOUNTER — Ambulatory Visit (INDEPENDENT_AMBULATORY_CARE_PROVIDER_SITE_OTHER): Admitting: Endocrinology

## 2015-04-28 VITALS — BP 135/70 | HR 64 | Temp 97.8°F | Ht 69.0 in | Wt 222.0 lb

## 2015-04-28 DIAGNOSIS — E213 Hyperparathyroidism, unspecified: Secondary | ICD-10-CM

## 2015-04-28 MED ORDER — LISINOPRIL 40 MG PO TABS: 40.0000 mg | ORAL_TABLET | Freq: Every day | ORAL | Status: DC

## 2015-04-28 MED ORDER — FUROSEMIDE 20 MG PO TABS: 20.0000 mg | ORAL_TABLET | Freq: Every day | ORAL | Status: DC

## 2015-04-28 NOTE — Patient Instructions (Signed)
Please stop taking the potassium and vitamin-D pills.   Please change the lisinopril-HCT to lisinopril and furosemide (2 different prescriptions).  i have sent prescriptions to your pharmacy. Please come back for a follow-up appointment in 6 weeks.   Some people need surgery for this condition, but you don't as of now--good.

## 2015-04-28 NOTE — Progress Notes (Signed)
Subjective:    Patient ID: Hector [name], male    DOB: [birth year], 64 y.o.   MRN: [id number]  HPI Pt was first noted to have hypercalcemia in 2013.  Pt says he was told he needed no rx for this.  He has never had urolithiasis, PUD, pancreatitis, depression, osteoporosis, thyroid disease, sarcoidosis, cancer, or bony fracture.  He does not take A supplements, but he takes vitamin-D 5000 units per day.  He does not take antacids, but takes protonix.   Past Medical History  Diagnosis Date  . Arthritis   . Hypertension   . Hyperlipidemia   . Chronic kidney disease   . Tuberculosis     Past Surgical History  Procedure Laterality Date  . Prostate surgery  12/2012    History   Social History  . Marital Status: Married    Spouse Name: N/A  . Number of Children: 2  . Years of Education: N/A   Occupational History  . Not on file.   Social History Main Topics  . Smoking status: Never Smoker   . Smokeless tobacco: Never Used  . Alcohol Use: No  . Drug Use: No  . Sexual Activity: Not on file   Other Topics Concern  . Not on file   Social History Narrative    Current Outpatient Prescriptions on File Prior to Visit  Medication Sig Dispense Refill  . aspirin 81 MG tablet Take 81 mg by mouth daily.    . cetirizine (ZYRTEC) 10 MG tablet Take 10 mg by mouth daily.    . cyclobenzaprine (FLEXERIL) 5 MG tablet 1-2 qhs prn 14 tablet 0  . fluticasone (FLONASE) 50 MCG/ACT nasal spray Place 2 sprays into both nostrils daily. 16 g 3  . gabapentin (NEURONTIN) 100 MG capsule One pill every eight hours as needed; dose may be increased by one pill each dose after 72 hours if only partially effective 30 capsule 2  . lovastatin (MEVACOR) 40 MG tablet Take 0.5 tablets (20 mg total) by mouth at bedtime. 45 tablet 3  . meloxicam (MOBIC) 15 MG tablet Take 15 mg by mouth daily.    . pantoprazole (PROTONIX) 40 MG tablet Take 1 tablet (40 mg total) by mouth daily. 90 tablet 3  . ranitidine (ZANTAC)  300 MG tablet Take 150 mg by mouth 2 (two) times daily.    Marland Kitchen terazosin (HYTRIN) 10 MG capsule Take 10 mg by mouth at bedtime.     No current facility-administered medications on file prior to visit.    Allergies  Allergen Reactions  . Shellfish Allergy Hives    Family History  Problem Relation Age of Onset  . Arthritis Mother   . Hyperlipidemia Mother   . Heart disease Mother   . Arthritis Father   . Hyperlipidemia Father   . Heart disease Father   . Stomach cancer Mother   . Colon polyps Mother   . Kidney disease Father   . Hypertension Other     siblings  . Breast cancer Sister   . Ovarian cancer Sister   . Gallbladder disease Sister     x 6  . Other Neg Hx     hypercalcemia    BP 135/70 mmHg  Pulse 64  Temp(Src) 97.8 F (36.6 C) (Oral)  Ht 5\' 9"  (1.753 m)  Wt 222 lb (100.699 kg)  BMI 32.77 kg/m2  SpO2 97%   Review of Systems denies weight loss and hematuria,       Objective:  Physical Exam  VS: see vs page GEN: no distress HEAD: head: no deformity eyes: no periorbital swelling, no proptosis external nose and ears are normal mouth: no lesion seen NECK: supple, thyroid is not enlarged CHEST WALL: no deformity. No kyphosis LUNGS: clear to auscultation BREASTS:  No gynecomastia CV: reg rate and rhythm, no murmur ABD: abdomen is soft, nontender.  no hepatosplenomegaly.  not distended.  no hernia MUSCULOSKELETAL: muscle bulk and strength are grossly normal.  no obvious joint swelling.  gait is normal and steady.  EXTEMITIES: no deformity.  no edema NEURO:  cn 2-12 grossly intact.   readily moves all 4's.  sensation is intact to touch on all 4's. SKIN:  Normal texture and temperature.  No rash or suspicious lesion is visible.   NODES:  None palpable at the neck PSYCH: alert, well-oriented.  Does not appear anxious nor depressed.   Radiol: DEXA is normal   Lab Results  Component Value Date   PTH 86* 04/09/2015   CALCIUM 11.2* 04/09/2015          Assessment & Plan:  Primary hyperparathyroidism: new.  No indication for surgery now. HTN: well-controlled.  we'll change HCTZ to Lasix, to manage Ca++ level   Patient is advised the following: Patient Instructions  Please stop taking the potassium and vitamin-D pills.   Please change the lisinopril-HCT to lisinopril and furosemide (2 different prescriptions).  i have sent prescriptions to your pharmacy. Please come back for a follow-up appointment in 6 weeks.   Some people need surgery for this condition, but you don't as of now--good.

## 2015-05-04 ENCOUNTER — Ambulatory Visit: Payer: TRICARE For Life (TFL) | Admitting: Internal Medicine

## 2015-05-10 ENCOUNTER — Ambulatory Visit (INDEPENDENT_AMBULATORY_CARE_PROVIDER_SITE_OTHER): Payer: TRICARE For Life (TFL) | Admitting: Internal Medicine

## 2015-05-10 ENCOUNTER — Encounter: Payer: Self-pay | Admitting: Internal Medicine

## 2015-05-10 VITALS — BP 128/78 | HR 62 | Temp 97.9°F | Resp 16 | Wt 225.0 lb

## 2015-05-10 DIAGNOSIS — M5417 Radiculopathy, lumbosacral region: Secondary | ICD-10-CM

## 2015-05-10 DIAGNOSIS — W57XXXA Bitten or stung by nonvenomous insect and other nonvenomous arthropods, initial encounter: Secondary | ICD-10-CM | POA: Diagnosis not present

## 2015-05-10 DIAGNOSIS — T148 Other injury of unspecified body region: Secondary | ICD-10-CM

## 2015-05-10 NOTE — Patient Instructions (Signed)
Assess response to the gabapentin one every 8 hours as needed. If it is partially beneficial, it can be increased up to a total of 3 pills every 8 hours as needed. This increase of 1 pill each dose  should take place over 72 hours at least.If 300 mg is effective dose ; there is a 300 mg pill.  The best exercises for the low back include freestyle swimming, stretch aerobics, and yoga.Cybex & Nautilus machines rather than dead weights are better for the back.

## 2015-05-10 NOTE — Progress Notes (Signed)
Pre visit review using our clinic review tool, if applicable. No additional management support is needed unless otherwise documented below in the visit note. 

## 2015-05-10 NOTE — Progress Notes (Signed)
here
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
## 2015-09-02 ENCOUNTER — Other Ambulatory Visit: Payer: Self-pay | Admitting: Internal Medicine

## 2015-09-03 ENCOUNTER — Ambulatory Visit (INDEPENDENT_AMBULATORY_CARE_PROVIDER_SITE_OTHER): Admitting: Internal Medicine

## 2015-09-03 ENCOUNTER — Encounter: Payer: Self-pay | Admitting: Internal Medicine

## 2015-09-03 VITALS — BP 134/86 | HR 63 | Temp 98.1°F | Resp 16 | Wt 221.0 lb

## 2015-09-03 DIAGNOSIS — M25551 Pain in right hip: Secondary | ICD-10-CM

## 2015-09-03 DIAGNOSIS — Z23 Encounter for immunization: Secondary | ICD-10-CM

## 2015-09-03 MED ORDER — TRAMADOL HCL 50 MG PO TABS: 50.0000 mg | ORAL_TABLET | Freq: Three times a day (TID) | ORAL | Status: DC | PRN

## 2015-09-03 NOTE — Patient Instructions (Signed)
  Use the Cybex or Nautilus machines with low weights (25 pounds or less) to stretch the tendon. Performing the frog kick maneuver while holding onto the side of the swimming pool is also recommended.  [facility] after you do the Cybex /Nautilus exercises

## 2015-09-03 NOTE — Progress Notes (Signed)
Pre visit review using our clinic review tool, if applicable. No additional management support is needed unless otherwise documented below in the visit note. 

## 2015-09-03 NOTE — Progress Notes (Signed)
   Subjective:    Patient ID: Hector [name], male    DOB: [birth year], 64 y.o.   MRN: [id number]  HPI He describes right hip pain for 3 days which is now up to an 8 on a 10 scale. He exercised as a brisk walk for 3.5 miles Tuesday 9/27 and then drove to and from [place]. He also sat with his right leg flexed and everted under the left thigh while sitting and watching TV for approximately 1 hour. Since that time he's had pain when he goes from sitting to standing as if his hip "seizes up"   Review of Systems He denies numbness, tingling, or weakness in extremities. He has no loss of control of bladder or bowels. He has had no associated fever, chills, sweats. There was no associated rash.    Objective:   Physical Exam Pertinent or positive findings include: He has localized discomfort above and around the right hip but there is full range of motion of the hip. Straight leg raising is negative to 90. He is able to walk on his heels and toes.  General appearance :adequately nourished; in no distress.  Eyes: No conjunctival inflammation or scleral icterus is present.  Heart:  Normal rate and regular rhythm. S1 and S2 normal without gallop, murmur, click, rub or other extra sounds    Lungs:Chest clear to auscultation; no wheezes, rhonchi,rales ,or rubs present.No increased work of breathing.   Abdomen: bowel sounds normal, soft and non-tender without masses, organomegaly or hernias noted.  No guarding or rebound.   Vascular : all pulses equal ; no bruits present.  Skin:Warm & dry.  Intact without suspicious lesions or rashes ; no tenting    Lymphatic: No lymphadenopathy is noted about the head, neck, axilla.   Neuro: Strength, tone & DTRs normal.       Assessment & Plan:  #1 hyperextension injury of the right hip due to prolonged sitting with the right leg flexed and hip rotated laterally.  See orders and recommendations and after visit summary.

## 2015-10-09 ENCOUNTER — Other Ambulatory Visit: Payer: Self-pay | Admitting: Endocrinology

## 2015-11-04 ENCOUNTER — Ambulatory Visit (INDEPENDENT_AMBULATORY_CARE_PROVIDER_SITE_OTHER): Admitting: Family

## 2015-11-04 ENCOUNTER — Encounter: Payer: Self-pay | Admitting: Family

## 2015-11-04 VITALS — BP 134/88 | HR 78 | Temp 98.0°F | Resp 18 | Ht 69.0 in | Wt 223.4 lb

## 2015-11-04 DIAGNOSIS — J069 Acute upper respiratory infection, unspecified: Secondary | ICD-10-CM | POA: Diagnosis not present

## 2015-11-04 MED ORDER — BENZONATATE 100 MG PO CAPS
100.0000 mg | ORAL_CAPSULE | Freq: Two times a day (BID) | ORAL | Status: DC | PRN
Start: 2015-11-04 — End: 2015-11-18

## 2015-11-04 MED ORDER — HYDROCOD POLST-CPM POLST ER 10-8 MG/5ML PO SUER: 5.0000 mL | Freq: Every evening | ORAL | Status: DC | PRN

## 2015-11-04 NOTE — Progress Notes (Signed)
Pre visit review using our clinic review tool, if applicable. No additional management support is needed unless otherwise documented below in the visit note. 

## 2015-11-04 NOTE — Assessment & Plan Note (Signed)
Symptoms and exam consistent with acute upper respiratory infection most likely viral. Start Tussionex as needed for cough and sleep. Start Tessalon as needed for cough during the day. Continue over-the-counter medications as needed for symptom relief and supportive care. Follow-up if symptoms worsen or fail to improve.

## 2015-11-04 NOTE — Patient Instructions (Addendum)
Thank you for choosing [facility].  Summary/Instructions:  Your prescription(s) have been submitted to your pharmacy or been printed and provided for you. Please take as directed and contact our office if you believe you are having problem(s) with the medication(s) or have any questions.  If your symptoms worsen or fail to improve, please contact our office for further instruction, or in case of emergency go directly to the emergency room at the closest medical facility.    General Recommendations:    Please drink plenty of fluids.  Get plenty of rest   Sleep in humidified air  Use saline nasal sprays  Netti pot   OTC Medications:  Decongestants - helps relieve congestion   Flonase (generic fluticasone) or Nasacort (generic triamcinolone) - please make sure to use the "cross-over" technique at a 45 degree angle towards the opposite eye as opposed to straight up the nasal passageway.   Sudafed (generic pseudoephedrine - Note this is the one that is available behind the pharmacy counter); Products with phenylephrine (-PE) may also be used but is often not as effective as pseudoephedrine.   If you have HIGH BLOOD PRESSURE - Coricidin HBP; AVOID any product that is -D as this contains pseudoephedrine which may increase your blood pressure.  Afrin (oxymetazoline) every 6-8 hours for up to 3 days.   Allergies - helps relieve runny nose, itchy eyes and sneezing   Claritin (generic loratidine), Allegra (fexofenidine), or Zyrtec (generic cyrterizine) for runny nose. These medications should not cause drowsiness.  Note - Benadryl (generic diphenhydramine) may be used however may cause drowsiness  Cough -   Delsym or Robitussin (generic dextromethorphan)  Expectorants - helps loosen mucus to ease removal   Mucinex (generic guaifenesin) as directed on the package.  Headaches / General Aches   Tylenol (generic acetaminophen) - DO NOT EXCEED 3 grams (3,000 mg) in a 24  hour time period  Advil/Motrin (generic ibuprofen)   Sore Throat -   Salt water gargle   Chloraseptic (generic benzocaine) spray or lozenges / Sucrets (generic dyclonine)    Upper Respiratory Infection, Adult Most upper respiratory infections (URIs) are a viral infection of the air passages leading to the lungs. A URI affects the nose, throat, and upper air passages. The most common type of URI is nasopharyngitis and is typically referred to as "the common cold." URIs run their course and usually go away on their own. Most of the time, a URI does not require medical attention, but sometimes a bacterial infection in the upper airways can follow a viral infection. This is called a secondary infection. Sinus and middle ear infections are common types of secondary upper respiratory infections. Bacterial pneumonia can also complicate a URI. A URI can worsen asthma and chronic obstructive pulmonary disease (COPD). Sometimes, these complications can require emergency medical care and may be life threatening.  CAUSES Almost all URIs are caused by viruses. A virus is a type of germ and can spread from one person to another.  RISKS FACTORS You may be at risk for a URI if:   You smoke.   You have chronic heart or lung disease.  You have a weakened defense (immune) system.   You are very young or very old.   You have nasal allergies or asthma.  You work in crowded or poorly ventilated areas.  You work in health care facilities or schools. SIGNS AND SYMPTOMS  Symptoms typically develop 2-3 days after you come in contact with a cold virus. Most   viral URIs last 7-10 days. However, viral URIs from the influenza virus (flu virus) can last 14-18 days and are typically more severe. Symptoms may include:   Runny or stuffy (congested) nose.   Sneezing.   Cough.   Sore throat.   Headache.   Fatigue.   Fever.   Loss of appetite.   Pain in your forehead, behind your eyes, and  over your cheekbones (sinus pain).  Muscle aches.  DIAGNOSIS  Your health care provider may diagnose a URI by:  Physical exam.  Tests to check that your symptoms are not due to another condition such as:  Strep throat.  Sinusitis.  Pneumonia.  Asthma. TREATMENT  A URI goes away on its own with time. It cannot be cured with medicines, but medicines may be prescribed or recommended to relieve symptoms. Medicines may help:  Reduce your fever.  Reduce your cough.  Relieve nasal congestion. HOME CARE INSTRUCTIONS   Take medicines only as directed by your health care provider.   Gargle warm saltwater or take cough drops to comfort your throat as directed by your health care provider.  Use a warm mist humidifier or inhale steam from a shower to increase air moisture. This may make it easier to breathe.  Drink enough fluid to keep your urine clear or pale yellow.   Eat soups and other clear broths and maintain good nutrition.   Rest as needed.   Return to work when your temperature has returned to normal or as your health care provider advises. You may need to stay home longer to avoid infecting others. You can also use a face mask and careful hand washing to prevent spread of the virus.  Increase the usage of your inhaler if you have asthma.   Do not use any tobacco products, including cigarettes, chewing tobacco, or electronic cigarettes. If you need help quitting, ask your health care provider. PREVENTION  The best way to protect yourself from getting a cold is to practice good hygiene.   Avoid oral or hand contact with people with cold symptoms.   Wash your hands often if contact occurs.  There is no clear evidence that vitamin C, vitamin E, echinacea, or exercise reduces the chance of developing a cold. However, it is always recommended to get plenty of rest, exercise, and practice good nutrition.  SEEK MEDICAL CARE IF:   You are getting worse rather than  better.   Your symptoms are not controlled by medicine.   You have chills.  You have worsening shortness of breath.  You have brown or red mucus.  You have yellow or brown nasal discharge.  You have pain in your face, especially when you bend forward.  You have a fever.  You have swollen neck glands.  You have pain while swallowing.  You have white areas in the back of your throat. SEEK IMMEDIATE MEDICAL CARE IF:   You have severe or persistent:  Headache.  Ear pain.  Sinus pain.  Chest pain.  You have chronic lung disease and any of the following:  Wheezing.  Prolonged cough.  Coughing up blood.  A change in your usual mucus.  You have a stiff neck.  You have changes in your:  Vision.  Hearing.  Thinking.  Mood. MAKE SURE YOU:   Understand these instructions.  Will watch your condition.  Will get help right away if you are not doing well or get worse.   This information is not intended to replace advice   given to you by your health care provider. Make sure you discuss any questions you have with your health care provider.   Document Released: 05/16/2001 Document Revised: 04/06/2015 Document Reviewed: 02/25/2014 Elsevier Interactive Patient Education 2016 [employer]  

## 2015-11-04 NOTE — Progress Notes (Signed)
Subjective:    Patient ID: Hector [name], male    DOB: [birth year], 64 y.o.   MRN: [id number]  Chief Complaint  Patient presents with  . Nasal Congestion    congestion, cough, and not able to rest at night, has been going on since last thursday    HPI:  Hector [name] is a 64 y.o. male who  has a past medical history of Arthritis; Hypertension; Hyperlipidemia; Chronic kidney disease; and Tuberculosis. and presents today for an acute office visit.  Associated symptoms of congestion, cough, sore throat and disturbed sleep going on for approximately one week. Modifying factors include Sudafed, Mucinex and hot tea which have helped with his symptoms. Course of the symptoms has improved however did experience some worsening yesterday. Denies any recent antibiotic.  Allergies  Allergen Reactions  . Shellfish Allergy Hives     Current Outpatient Prescriptions on File Prior to Visit  Medication Sig Dispense Refill  . aspirin 81 MG tablet Take 81 mg by mouth daily.    . cetirizine (ZYRTEC) 10 MG tablet Take 10 mg by mouth daily.    . cyclobenzaprine (FLEXERIL) 5 MG tablet 1-2 qhs prn 14 tablet 0  . fluticasone (FLONASE) 50 MCG/ACT nasal spray USE 2 SPRAYS IN EACH NOSTRIL DAILY 16 g 2  . furosemide (LASIX) 20 MG tablet TAKE 1 TABLET DAILY 30 tablet 2  . gabapentin (NEURONTIN) 100 MG capsule One pill every eight hours as needed; dose may be increased by one pill each dose after 72 hours if only partially effective 30 capsule 2  . lisinopril (PRINIVIL,ZESTRIL) 40 MG tablet Take 1 tablet (40 mg total) by mouth daily. 90 tablet 3  . lovastatin (MEVACOR) 40 MG tablet Take 0.5 tablets (20 mg total) by mouth at bedtime. 45 tablet 3  . meloxicam (MOBIC) 15 MG tablet Take 15 mg by mouth daily.    . pantoprazole (PROTONIX) 40 MG tablet Take 1 tablet (40 mg total) by mouth daily. 90 tablet 3  . ranitidine (ZANTAC) 300 MG tablet Take 150 mg by mouth 2 (two) times daily.    Marland Kitchen terazosin (HYTRIN) 10 MG  capsule Take 10 mg by mouth at bedtime.    . traMADol (ULTRAM) 50 MG tablet Take 1 tablet (50 mg total) by mouth every 8 (eight) hours as needed. 30 tablet 0   No current facility-administered medications on file prior to visit.    Review of Systems  Constitutional: Negative for fever and chills.  HENT: Positive for congestion. Negative for ear pain, sinus pressure and sore throat.   Respiratory: Positive for cough, chest tightness and shortness of breath.       Objective:    BP 134/88 mmHg  Pulse 78  Temp(Src) 98 F (36.7 C) (Oral)  Resp 18  Ht 5\' 9"  (1.753 m)  Wt 223 lb 6.4 oz (101.334 kg)  BMI 32.98 kg/m2  SpO2 97% Nursing note and vital signs reviewed.  Physical Exam  Constitutional: He is oriented to person, place, and time. He appears well-developed and well-nourished. No distress.  HENT:  Right Ear: Hearing, tympanic membrane, external ear and ear canal normal.  Left Ear: Hearing, tympanic membrane, external ear and ear canal normal.  Nose: Right sinus exhibits no maxillary sinus tenderness and no frontal sinus tenderness. Left sinus exhibits no maxillary sinus tenderness and no frontal sinus tenderness.  Mouth/Throat: Uvula is midline, oropharynx is clear and moist and mucous membranes are normal.  Cardiovascular: Normal rate, regular rhythm, normal heart sounds and  intact distal pulses.   Pulmonary/Chest: Effort normal and breath sounds normal.  Neurological: He is alert and oriented to person, place, and time.  Skin: Skin is warm and dry.  Psychiatric: He has a normal mood and affect. His behavior is normal. Judgment and thought content normal.       Assessment & Plan:   Problem List Items Addressed This Visit      Respiratory   Acute upper respiratory infection - Primary    Symptoms and exam consistent with acute upper respiratory infection most likely viral. Start Tussionex as needed for cough and sleep. Start Tessalon as needed for cough during the day.  Continue over-the-counter medications as needed for symptom relief and supportive care. Follow-up if symptoms worsen or fail to improve.      Relevant Medications   chlorpheniramine-HYDROcodone (TUSSIONEX PENNKINETIC ER) 10-8 MG/5ML SUER   benzonatate (TESSALON) 100 MG capsule

## 2015-11-18 ENCOUNTER — Ambulatory Visit (INDEPENDENT_AMBULATORY_CARE_PROVIDER_SITE_OTHER): Payer: TRICARE For Life (TFL) | Admitting: Family

## 2015-11-18 ENCOUNTER — Encounter: Payer: Self-pay | Admitting: Family

## 2015-11-18 VITALS — BP 110/80 | HR 67 | Temp 97.9°F | Resp 18 | Ht 69.0 in | Wt 218.0 lb

## 2015-11-18 DIAGNOSIS — J069 Acute upper respiratory infection, unspecified: Secondary | ICD-10-CM

## 2015-11-18 MED ORDER — BENZONATATE 100 MG PO CAPS: 100.0000 mg | ORAL_CAPSULE | Freq: Two times a day (BID) | ORAL | Status: DC | PRN

## 2015-11-18 MED ORDER — LEVOFLOXACIN 500 MG PO TABS: 500.0000 mg | ORAL_TABLET | Freq: Every day | ORAL | Status: DC

## 2015-11-18 NOTE — Assessment & Plan Note (Signed)
Symptoms and exam consistent with acute upper respiratory infection, although cannot rule out underlying bronchitis. Start levofloxacin. Continue Tessalon Perles and Tussionex as needed for cough and sleep. Continue over-the-counter medications as needed for symptom relief and supportive care. Follow-up if symptoms worsen or fail to improve.

## 2015-11-18 NOTE — Progress Notes (Signed)
Pre visit review using our clinic review tool, if applicable. No additional management support is needed unless otherwise documented below in the visit note. 

## 2015-11-18 NOTE — Patient Instructions (Signed)
Thank you for choosing [facility].  Summary/Instructions:  Your prescription(s) have been submitted to your pharmacy or been printed and provided for you. Please take as directed and contact our office if you believe you are having problem(s) with the medication(s) or have any questions.  If your symptoms worsen or fail to improve, please contact our office for further instruction, or in case of emergency go directly to the emergency room at the closest medical facility.   General Recommendations:    Please drink plenty of fluids.  Get plenty of rest   Sleep in humidified air  Use saline nasal sprays  Netti pot   OTC Medications:  Decongestants - helps relieve congestion   Flonase (generic fluticasone) or Nasacort (generic triamcinolone) - please make sure to use the "cross-over" technique at a 45 degree angle towards the opposite eye as opposed to straight up the nasal passageway.   Sudafed (generic pseudoephedrine - Note this is the one that is available behind the pharmacy counter); Products with phenylephrine (-PE) may also be used but is often not as effective as pseudoephedrine.   If you have HIGH BLOOD PRESSURE - Coricidin HBP; AVOID any product that is -D as this contains pseudoephedrine which may increase your blood pressure.  Afrin (oxymetazoline) every 6-8 hours for up to 3 days.   Allergies - helps relieve runny nose, itchy eyes and sneezing   Claritin (generic loratidine), Allegra (fexofenidine), or Zyrtec (generic cyrterizine) for runny nose. These medications should not cause drowsiness.  Note - Benadryl (generic diphenhydramine) may be used however may cause drowsiness  Cough -   Delsym or Robitussin (generic dextromethorphan)  Expectorants - helps loosen mucus to ease removal   Mucinex (generic guaifenesin) as directed on the package.  Headaches / General Aches   Tylenol (generic acetaminophen) - DO NOT EXCEED 3 grams (3,000 mg) in a 24  hour time period  Advil/Motrin (generic ibuprofen)   Sore Throat -   Salt water gargle   Chloraseptic (generic benzocaine) spray or lozenges / Sucrets (generic dyclonine)      

## 2015-11-18 NOTE — Progress Notes (Signed)
Subjective:    Patient ID: Hector [name], male    DOB: January 19, 1951, 64 y.o.   MRN: [id number]  Chief Complaint  Patient presents with  . Cough    has been sick since around thankgiving, porductive cough, congestion, chills and fever here and there    HPI:  Hector [name] is a 64 y.o. male who  has a past medical history of Arthritis; Hypertension; Hyperlipidemia; Chronic kidney disease; and Tuberculosis. and presents today for an acute office visit.   Previously seen in the office and diagnosed with acute upper respiratory infection. Today he continues to experience the associated symptoms of productive cough, congestion and waxing and waning fevers. T-max of 100 with the last fever a couple of days ago. Course of the symptoms have worsened since his last visit.   Allergies  Allergen Reactions  . Shellfish Allergy Hives     Current Outpatient Prescriptions on File Prior to Visit  Medication Sig Dispense Refill  . aspirin 81 MG tablet Take 81 mg by mouth daily.    . cetirizine (ZYRTEC) 10 MG tablet Take 10 mg by mouth daily.    . chlorpheniramine-HYDROcodone (TUSSIONEX PENNKINETIC ER) 10-8 MG/5ML SUER Take 5 mLs by mouth at bedtime as needed for cough. 115 mL 0  . cyclobenzaprine (FLEXERIL) 5 MG tablet 1-2 qhs prn 14 tablet 0  . fluticasone (FLONASE) 50 MCG/ACT nasal spray USE 2 SPRAYS IN EACH NOSTRIL DAILY 16 g 2  . furosemide (LASIX) 20 MG tablet TAKE 1 TABLET DAILY 30 tablet 2  . gabapentin (NEURONTIN) 100 MG capsule One pill every eight hours as needed; dose may be increased by one pill each dose after 72 hours if only partially effective 30 capsule 2  . lisinopril (PRINIVIL,ZESTRIL) 40 MG tablet Take 1 tablet (40 mg total) by mouth daily. 90 tablet 3  . lovastatin (MEVACOR) 40 MG tablet Take 0.5 tablets (20 mg total) by mouth at bedtime. 45 tablet 3  . meloxicam (MOBIC) 15 MG tablet Take 15 mg by mouth daily.    . pantoprazole (PROTONIX) 40 MG tablet Take 1 tablet (40 mg  total) by mouth daily. 90 tablet 3  . ranitidine (ZANTAC) 300 MG tablet Take 150 mg by mouth 2 (two) times daily.    Marland Kitchen terazosin (HYTRIN) 10 MG capsule Take 10 mg by mouth at bedtime.    . traMADol (ULTRAM) 50 MG tablet Take 1 tablet (50 mg total) by mouth every 8 (eight) hours as needed. 30 tablet 0   No current facility-administered medications on file prior to visit.     Review of Systems  Constitutional: Positive for fever. Negative for chills.  HENT: Positive for congestion, sinus pressure and sore throat.   Respiratory: Positive for cough. Negative for chest tightness and shortness of breath.   Neurological: Negative for headaches.      Objective:    BP 110/80 mmHg  Pulse 67  Temp(Src) 97.9 F (36.6 C) (Oral)  Resp 18  Ht 5\' 9"  (1.753 m)  Wt 218 lb (98.884 kg)  BMI 32.18 kg/m2  SpO2 92% Nursing note and vital signs reviewed.  Physical Exam  Constitutional: He is oriented to person, place, and time. He appears well-developed and well-nourished. No distress.  HENT:  Right Ear: Hearing, tympanic membrane, external ear and ear canal normal.  Left Ear: Hearing, tympanic membrane, external ear and ear canal normal.  Nose: Right sinus exhibits maxillary sinus tenderness. Left sinus exhibits maxillary sinus tenderness.  Mouth/Throat: Uvula is midline, oropharynx  is clear and moist and mucous membranes are normal.  Cardiovascular: Normal rate, regular rhythm, normal heart sounds and intact distal pulses.   Pulmonary/Chest: Effort normal and breath sounds normal.  Neurological: He is alert and oriented to person, place, and time.  Skin: Skin is warm and dry.  Psychiatric: He has a normal mood and affect. His behavior is normal. Judgment and thought content normal.       Assessment & Plan:   Problem List Items Addressed This Visit      Respiratory   Acute upper respiratory infection - Primary    Symptoms and exam consistent with acute upper respiratory infection, although  cannot rule out underlying bronchitis. Start levofloxacin. Continue Tessalon Perles and Tussionex as needed for cough and sleep. Continue over-the-counter medications as needed for symptom relief and supportive care. Follow-up if symptoms worsen or fail to improve.      Relevant Medications   benzonatate (TESSALON) 100 MG capsule   levofloxacin (LEVAQUIN) 500 MG tablet

## 2015-11-25 ENCOUNTER — Telehealth: Payer: Self-pay | Admitting: Internal Medicine

## 2015-11-25 MED ORDER — AZITHROMYCIN 250 MG PO TABS: ORAL_TABLET | ORAL | Status: DC

## 2015-11-25 NOTE — Telephone Encounter (Signed)
Pt called in said that he is still wheezing and coughing, he would like for Dr Sharlet Salina to call him in meds   [facility] [place]

## 2015-11-25 NOTE — Telephone Encounter (Signed)
Pt called to check up on this request. Please let him know

## 2015-11-25 NOTE — Telephone Encounter (Signed)
Please advise in Dr. Crawford's absence, thanks.  

## 2015-11-25 NOTE — Telephone Encounter (Signed)
Tried to reach patient, left message.

## 2015-11-25 NOTE — Telephone Encounter (Signed)
Azithromycin sent to pharmacy

## 2016-01-06 ENCOUNTER — Other Ambulatory Visit: Payer: Self-pay | Admitting: Internal Medicine

## 2016-01-06 ENCOUNTER — Other Ambulatory Visit: Payer: Self-pay | Admitting: Endocrinology

## 2016-02-17 ENCOUNTER — Other Ambulatory Visit: Payer: Self-pay | Admitting: Endocrinology

## 2016-03-10 ENCOUNTER — Other Ambulatory Visit: Payer: Self-pay | Admitting: Internal Medicine

## 2016-03-23 ENCOUNTER — Ambulatory Visit: Admitting: Family

## 2016-03-24 ENCOUNTER — Encounter: Payer: Self-pay | Admitting: Family

## 2016-03-24 ENCOUNTER — Ambulatory Visit (INDEPENDENT_AMBULATORY_CARE_PROVIDER_SITE_OTHER): Admitting: Family

## 2016-03-24 VITALS — BP 122/82 | HR 61 | Temp 97.8°F | Resp 16 | Ht 69.0 in | Wt 220.0 lb

## 2016-03-24 DIAGNOSIS — M545 Low back pain, unspecified: Secondary | ICD-10-CM | POA: Insufficient documentation

## 2016-03-24 DIAGNOSIS — M5442 Lumbago with sciatica, left side: Secondary | ICD-10-CM | POA: Diagnosis not present

## 2016-03-24 DIAGNOSIS — G8929 Other chronic pain: Secondary | ICD-10-CM | POA: Insufficient documentation

## 2016-03-24 MED ORDER — PREDNISONE 5 MG (21) PO TBPK: ORAL_TABLET | ORAL | Status: DC

## 2016-03-24 MED ORDER — CYCLOBENZAPRINE HCL 10 MG PO TABS: 5.0000 mg | ORAL_TABLET | Freq: Three times a day (TID) | ORAL | Status: DC | PRN

## 2016-03-24 NOTE — Assessment & Plan Note (Addendum)
Symptoms and exam consistent with low back pain with sciatica on the left side with no trauma. Most likely related to muscle imbalance. Treat conservatively with ice, home exercise therapy, prednisone taper, and sample of Pennsaid given. Start cyclobenzaprine as needed for muscle spasm. Follow-up in 3 weeks or sooner if needed.

## 2016-03-24 NOTE — Progress Notes (Signed)
Subjective:    Patient ID: Hector [name], male    DOB: [birth year], 65 y.o.   MRN: [id number]  Chief Complaint  Patient presents with  . Hip Pain    x2 week, left hip starts from lower left back and goes down thigh    HPI:  Hector [name] is a 65 y.o. male who  has a past medical history of Arthritis; Hypertension; Hyperlipidemia; Chronic kidney disease; and Tuberculosis. and presents today For an acute office visit.  This is a new problem. Associated symptom of pain located in his left hip and lower back has been going on for approximately 2 weeks. There is radiculopathy into the thigh. Pain is described as burning in his back. Modifying factors include Tylenol, hot baths, walking, stretching and sleeping on a heating pad which have not helped very much. Denies any trauma or falls. Pain waxes and wanes. Aggravating by sitting and laying with improvements noted when standing and moving around.   Allergies  Allergen Reactions  . Shellfish Allergy Hives     Current Outpatient Prescriptions on File Prior to Visit  Medication Sig Dispense Refill  . aspirin 81 MG tablet Take 81 mg by mouth daily.    . benzonatate (TESSALON) 100 MG capsule Take 1 capsule (100 mg total) by mouth 2 (two) times daily as needed for cough. 30 capsule 0  . cetirizine (ZYRTEC) 10 MG tablet Take 10 mg by mouth daily.    . fluticasone (FLONASE) 50 MCG/ACT nasal spray USE 2 SPRAYS IN EACH NOSTRIL DAILY 16 g 0  . furosemide (LASIX) 20 MG tablet TAKE 1 TABLET DAILY 30 tablet 1  . lisinopril (PRINIVIL,ZESTRIL) 40 MG tablet Take 1 tablet (40 mg total) by mouth daily. 90 tablet 3  . lovastatin (MEVACOR) 40 MG tablet Take 0.5 tablets (20 mg total) by mouth at bedtime. 45 tablet 3  . meloxicam (MOBIC) 15 MG tablet Take 15 mg by mouth daily.    . pantoprazole (PROTONIX) 40 MG tablet TAKE 1 TABLET DAILY 90 tablet 2  . ranitidine (ZANTAC) 300 MG tablet Take 150 mg by mouth 2 (two) times daily.    Marland Kitchen terazosin (HYTRIN) 10 MG  capsule Take 10 mg by mouth at bedtime.     No current facility-administered medications on file prior to visit.     Past Surgical History  Procedure Laterality Date  . Prostate surgery  12/2012    no cancer    Past Medical History  Diagnosis Date  . Arthritis   . Hypertension   . Hyperlipidemia   . Chronic kidney disease   . Tuberculosis     Review of Systems  Constitutional: Negative for fever and chills.  Musculoskeletal: Positive for back pain.  Neurological: Positive for numbness. Negative for weakness.      Objective:    BP 122/82 mmHg  Pulse 61  Temp(Src) 97.8 F (36.6 C) (Oral)  Resp 16  Ht 5\' 9"  (1.753 m)  Wt 220 lb (99.791 kg)  BMI 32.47 kg/m2  SpO2 97% Nursing note and vital signs reviewed.  Physical Exam  Constitutional: He is oriented to person, place, and time. He appears well-developed and well-nourished. No distress.  Cardiovascular: Normal rate, regular rhythm, normal heart sounds and intact distal pulses.   Pulmonary/Chest: Effort normal and breath sounds normal.  Musculoskeletal:  Low back pain - increased lordotic curve noted with no obvious deformities, discoloration, or edema. Palpable tenderness along lumbar paraspinal musculature and slightly on SI joint. Discomfort also noted with  palpation across sciatic nerve distribution into the gluteal region. Range of motion is normal with discomfort noted in extension and lateral bending to the left side. Straight leg raise is negative. Faber's test is positive on the left. Distal pulses and sensation are intact and appropriate.  Neurological: He is alert and oriented to person, place, and time.  Skin: Skin is warm and dry.  Psychiatric: He has a normal mood and affect. His behavior is normal. Judgment and thought content normal.       Assessment & Plan:   Problem List Items Addressed This Visit      Other   Low back pain - Primary    Symptoms and exam consistent with low back pain with sciatica  on the left side with no trauma. Most likely related to muscle imbalance. Treat conservatively with ice, home exercise therapy, prednisone taper, and sample of Pennsaid given. Start cyclobenzaprine as needed for muscle spasm. Follow-up in 3 weeks or sooner if needed.      Relevant Medications   cyclobenzaprine (FLEXERIL) 10 MG tablet   predniSONE (STERAPRED UNI-PAK 21 TAB) 5 MG (21) TBPK tablet       I have discontinued Mr. Gutmann's cyclobenzaprine, gabapentin, traMADol, chlorpheniramine-HYDROcodone, and azithromycin. I am also having him start on cyclobenzaprine and predniSONE. Additionally, I am having him maintain his ranitidine, meloxicam, terazosin, cetirizine, aspirin, lovastatin, lisinopril, benzonatate, furosemide, pantoprazole, and fluticasone.   Meds ordered this encounter  Medications  . cyclobenzaprine (FLEXERIL) 10 MG tablet    Sig: Take 0.5-1 tablets (5-10 mg total) by mouth 3 (three) times daily as needed for muscle spasms.    Dispense:  40 tablet    Refill:  0    Order Specific Question:  Supervising Provider    Answer:  Pricilla Holm A [id number]  . predniSONE (STERAPRED UNI-PAK 21 TAB) 5 MG (21) TBPK tablet    Sig: Take 6 tablets x 1 day, 5 tablets x 1 day, 4 tablets x 1 day, 3 tablets x 1 day, 2 tablets x 1 day, 1 tablet x 1 day    Dispense:  21 tablet    Refill:  0    Order Specific Question:  Supervising Provider    Answer:  Pricilla Holm A [id number]     Follow-up: Return in about 3 weeks (around 04/14/2016).  Mauricio Po, FNP

## 2016-03-24 NOTE — Progress Notes (Signed)
Pre visit review using our clinic review tool, if applicable. No additional management support is needed unless otherwise documented below in the visit note. 

## 2016-03-24 NOTE — Patient Instructions (Signed)
Thank you for choosing [facility].  Summary/Instructions:  Ice 2-3 times per day and after activity Stretches and exercises daily Pennsaid - 2x per day about 1/2 pack Continue meloxicam.  Flexeril as needed.   Your prescription(s) have been submitted to your pharmacy or been printed and provided for you. Please take as directed and contact our office if you believe you are having problem(s) with the medication(s) or have any questions.  If your symptoms worsen or fail to improve, please contact our office for further instruction, or in case of emergency go directly to the emergency room at the closest medical facility.   Sciatica With Rehab The sciatic nerve runs from the back down the leg and is responsible for sensation and control of the muscles in the back (posterior) side of the thigh, lower leg, and foot. Sciatica is a condition that is characterized by inflammation of this nerve.  SYMPTOMS   Signs of nerve damage, including numbness and/or weakness along the posterior side of the lower extremity.  Pain in the back of the thigh that may also travel down the leg.  Pain that worsens when sitting for long periods of time.  Occasionally, pain in the back or buttock. CAUSES  Inflammation of the sciatic nerve is the cause of sciatica. The inflammation is due to something irritating the nerve. Common sources of irritation include:  Sitting for long periods of time.  Direct trauma to the nerve.  Arthritis of the spine.  Herniated or ruptured disk.  Slipping of the vertebrae (spondylolisthesis).  Pressure from soft tissues, such as muscles or ligament-like tissue (fascia). RISK INCREASES WITH:  Sports that place pressure or stress on the spine (football or weightlifting).  Poor strength and flexibility.  Failure to warm up properly before activity.  Family history of low back pain or disk disorders.  Previous back injury or surgery.  Poor body mechanics,  especially when lifting, or poor posture. PREVENTION   Warm up and stretch properly before activity.  Maintain physical fitness:  Strength, flexibility, and endurance.  Cardiovascular fitness.  Learn and use proper technique, especially with posture and lifting. When possible, have coach correct improper technique.  Avoid activities that place stress on the spine. PROGNOSIS If treated properly, then sciatica usually resolves within 6 weeks. However, occasionally surgery is necessary.  RELATED COMPLICATIONS   Permanent nerve damage, including pain, numbness, tingle, or weakness.  Chronic back pain.  Risks of surgery: infection, bleeding, nerve damage, or damage to surrounding tissues. TREATMENT Treatment initially involves resting from any activities that aggravate your symptoms. The use of ice and medication may help reduce pain and inflammation. The use of strengthening and stretching exercises may help reduce pain with activity. These exercises may be performed at home or with referral to a therapist. A therapist may recommend further treatments, such as transcutaneous electronic nerve stimulation (TENS) or ultrasound. Your caregiver may recommend corticosteroid injections to help reduce inflammation of the sciatic nerve. If symptoms persist despite non-surgical (conservative) treatment, then surgery may be recommended. MEDICATION  If pain medication is necessary, then nonsteroidal anti-inflammatory medications, such as aspirin and ibuprofen, or other minor pain relievers, such as acetaminophen, are often recommended.  Do not take pain medication for 7 days before surgery.  Prescription pain relievers may be given if deemed necessary by your caregiver. Use only as directed and only as much as you need.  Ointments applied to the skin may be helpful.  Corticosteroid injections may be given by your caregiver. These  injections should be reserved for the most serious cases, because  they may only be given a certain number of times. HEAT AND COLD  Cold treatment (icing) relieves pain and reduces inflammation. Cold treatment should be applied for 10 to 15 minutes every 2 to 3 hours for inflammation and pain and immediately after any activity that aggravates your symptoms. Use ice packs or massage the area with a piece of ice (ice massage).  Heat treatment may be used prior to performing the stretching and strengthening activities prescribed by your caregiver, physical therapist, or athletic trainer. Use a heat pack or soak the injury in warm water. SEEK MEDICAL CARE IF:  Treatment seems to offer no benefit, or the condition worsens.  Any medications produce adverse side effects. EXERCISES  RANGE OF MOTION (ROM) AND STRETCHING EXERCISES - Sciatica Most people with sciatic will find that their symptoms worsen with either excessive bending forward (flexion) or arching at the low back (extension). The exercises which will help resolve your symptoms will focus on the opposite motion. Your physician, physical therapist or athletic trainer will help you determine which exercises will be most helpful to resolve your low back pain. Do not complete any exercises without first consulting with your clinician. Discontinue any exercises which worsen your symptoms until you speak to your clinician. If you have pain, numbness or tingling which travels down into your buttocks, leg or foot, the goal of the therapy is for these symptoms to move closer to your back and eventually resolve. Occasionally, these leg symptoms will get better, but your low back pain may worsen; this is typically an indication of progress in your rehabilitation. Be certain to be very alert to any changes in your symptoms and the activities in which you participated in the 24 hours prior to the change. Sharing this information with your clinician will allow him/her to most efficiently treat your condition. These exercises may  help you when beginning to rehabilitate your injury. Your symptoms may resolve with or without further involvement from your physician, physical therapist or athletic trainer. While completing these exercises, remember:   Restoring tissue flexibility helps normal motion to return to the joints. This allows healthier, less painful movement and activity.  An effective stretch should be held for at least 30 seconds.  A stretch should never be painful. You should only feel a gentle lengthening or release in the stretched tissue. FLEXION RANGE OF MOTION AND STRETCHING EXERCISES: STRETCH - Flexion, Single Knee to Chest   Lie on a firm bed or floor with both legs extended in front of you.  Keeping one leg in contact with the floor, bring your opposite knee to your chest. Hold your leg in place by either grabbing behind your thigh or at your knee.  Pull until you feel a gentle stretch in your low back. Hold __________ seconds.  Slowly release your grasp and repeat the exercise with the opposite side. Repeat __________ times. Complete this exercise __________ times per day.  STRETCH - Flexion, Double Knee to Chest  Lie on a firm bed or floor with both legs extended in front of you.  Keeping one leg in contact with the floor, bring your opposite knee to your chest.  Tense your stomach muscles to support your back and then lift your other knee to your chest. Hold your legs in place by either grabbing behind your thighs or at your knees.  Pull both knees toward your chest until you feel a gentle stretch in  your low back. Hold __________ seconds.  Tense your stomach muscles and slowly return one leg at a time to the floor. Repeat __________ times. Complete this exercise __________ times per day.  STRETCH - Low Trunk Rotation   Lie on a firm bed or floor. Keeping your legs in front of you, bend your knees so they are both pointed toward the ceiling and your feet are flat on the floor.  Extend  your arms out to the side. This will stabilize your upper body by keeping your shoulders in contact with the floor.  Gently and slowly drop both knees together to one side until you feel a gentle stretch in your low back. Hold for __________ seconds.  Tense your stomach muscles to support your low back as you bring your knees back to the starting position. Repeat the exercise to the other side. Repeat __________ times. Complete this exercise __________ times per day  EXTENSION RANGE OF MOTION AND FLEXIBILITY EXERCISES: STRETCH - Extension, Prone on Elbows  Lie on your stomach on the floor, a bed will be too soft. Place your palms about shoulder width apart and at the height of your head.  Place your elbows under your shoulders. If this is too painful, stack pillows under your chest.  Allow your body to relax so that your hips drop lower and make contact more completely with the floor.  Hold this position for __________ seconds.  Slowly return to lying flat on the floor. Repeat __________ times. Complete this exercise __________ times per day.  RANGE OF MOTION - Extension, Prone Press Ups  Lie on your stomach on the floor, a bed will be too soft. Place your palms about shoulder width apart and at the height of your head.  Keeping your back as relaxed as possible, slowly straighten your elbows while keeping your hips on the floor. You may adjust the placement of your hands to maximize your comfort. As you gain motion, your hands will come more underneath your shoulders.  Hold this position __________ seconds.  Slowly return to lying flat on the floor. Repeat __________ times. Complete this exercise __________ times per day.  STRENGTHENING EXERCISES - Sciatica  These exercises may help you when beginning to rehabilitate your injury. These exercises should be done near your "sweet spot." This is the neutral, low-back arch, somewhere between fully rounded and fully arched, that is your least  painful position. When performed in this safe range of motion, these exercises can be used for people who have either a flexion or extension based injury. These exercises may resolve your symptoms with or without further involvement from your physician, physical therapist or athletic trainer. While completing these exercises, remember:   Muscles can gain both the endurance and the strength needed for everyday activities through controlled exercises.  Complete these exercises as instructed by your physician, physical therapist or athletic trainer. Progress with the resistance and repetition exercises only as your caregiver advises.  You may experience muscle soreness or fatigue, but the pain or discomfort you are trying to eliminate should never worsen during these exercises. If this pain does worsen, stop and make certain you are following the directions exactly. If the pain is still present after adjustments, discontinue the exercise until you can discuss the trouble with your clinician. STRENGTHENING - Deep Abdominals, Pelvic Tilt   Lie on a firm bed or floor. Keeping your legs in front of you, bend your knees so they are both pointed toward the ceiling and  your feet are flat on the floor.  Tense your lower abdominal muscles to press your low back into the floor. This motion will rotate your pelvis so that your tail bone is scooping upwards rather than pointing at your feet or into the floor.  With a gentle tension and even breathing, hold this position for __________ seconds. Repeat __________ times. Complete this exercise __________ times per day.  STRENGTHENING - Abdominals, Crunches   Lie on a firm bed or floor. Keeping your legs in front of you, bend your knees so they are both pointed toward the ceiling and your feet are flat on the floor. Cross your arms over your chest.  Slightly tip your chin down without bending your neck.  Tense your abdominals and slowly lift your trunk high enough  to just clear your shoulder blades. Lifting higher can put excessive stress on the low back and does not further strengthen your abdominal muscles.  Control your return to the starting position. Repeat __________ times. Complete this exercise __________ times per day.  STRENGTHENING - Quadruped, Opposite UE/LE Lift  Assume a hands and knees position on a firm surface. Keep your hands under your shoulders and your knees under your hips. You may place padding under your knees for comfort.  Find your neutral spine and gently tense your abdominal muscles so that you can maintain this position. Your shoulders and hips should form a rectangle that is parallel with the floor and is not twisted.  Keeping your trunk steady, lift your right hand no higher than your shoulder and then your left leg no higher than your hip. Make sure you are not holding your breath. Hold this position __________ seconds.  Continuing to keep your abdominal muscles tense and your back steady, slowly return to your starting position. Repeat with the opposite arm and leg. Repeat __________ times. Complete this exercise __________ times per day.  STRENGTHENING - Abdominals and Quadriceps, Straight Leg Raise   Lie on a firm bed or floor with both legs extended in front of you.  Keeping one leg in contact with the floor, bend the other knee so that your foot can rest flat on the floor.  Find your neutral spine, and tense your abdominal muscles to maintain your spinal position throughout the exercise.  Slowly lift your straight leg off the floor about 6 inches for a count of 15, making sure to not hold your breath.  Still keeping your neutral spine, slowly lower your leg all the way to the floor. Repeat this exercise with each leg __________ times. Complete this exercise __________ times per day. POSTURE AND BODY MECHANICS CONSIDERATIONS - Sciatica Keeping correct posture when sitting, standing or completing your activities  will reduce the stress put on different body tissues, allowing injured tissues a chance to heal and limiting painful experiences. The following are general guidelines for improved posture. Your physician or physical therapist will provide you with any instructions specific to your needs. While reading these guidelines, remember:  The exercises prescribed by your provider will help you have the flexibility and strength to maintain correct postures.  The correct posture provides the optimal environment for your joints to work. All of your joints have less wear and tear when properly supported by a spine with good posture. This means you will experience a healthier, less painful body.  Correct posture must be practiced with all of your activities, especially prolonged sitting and standing. Correct posture is as important when doing repetitive low-stress activities (  typing) as it is when doing a single heavy-load activity (lifting). RESTING POSITIONS Consider which positions are most painful for you when choosing a resting position. If you have pain with flexion-based activities (sitting, bending, stooping, squatting), choose a position that allows you to rest in a less flexed posture. You would want to avoid curling into a fetal position on your side. If your pain worsens with extension-based activities (prolonged standing, working overhead), avoid resting in an extended position such as sleeping on your stomach. Most people will find more comfort when they rest with their spine in a more neutral position, neither too rounded nor too arched. Lying on a non-sagging bed on your side with a pillow between your knees, or on your back with a pillow under your knees will often provide some relief. Keep in mind, being in any one position for a prolonged period of time, no matter how correct your posture, can still lead to stiffness. PROPER SITTING POSTURE In order to minimize stress and discomfort on your spine, you  must sit with correct posture Sitting with good posture should be effortless for a healthy body. Returning to good posture is a gradual process. Many people can work toward this most comfortably by using various supports until they have the flexibility and strength to maintain this posture on their own. When sitting with proper posture, your ears will fall over your shoulders and your shoulders will fall over your hips. You should use the back of the chair to support your upper back. Your low back will be in a neutral position, just slightly arched. You may place a small pillow or folded towel at the base of your low back for support.  When working at a desk, create an environment that supports good, upright posture. Without extra support, muscles fatigue and lead to excessive strain on joints and other tissues. Keep these recommendations in mind: CHAIR:   A chair should be able to slide under your desk when your back makes contact with the back of the chair. This allows you to work closely.  The chair's height should allow your eyes to be level with the upper part of your monitor and your hands to be slightly lower than your elbows. BODY POSITION  Your feet should make contact with the floor. If this is not possible, use a foot rest.  Keep your ears over your shoulders. This will reduce stress on your neck and low back. INCORRECT SITTING POSTURES   If you are feeling tired and unable to assume a healthy sitting posture, do not slouch or slump. This puts excessive strain on your back tissues, causing more damage and pain. Healthier options include:  Using more support, like a lumbar pillow.  Switching tasks to something that requires you to be upright or walking.  Talking a brief walk.  Lying down to rest in a neutral-spine position. PROLONGED STANDING WHILE SLIGHTLY LEANING FORWARD  When completing a task that requires you to lean forward while standing in one place for a long time, place  either foot up on a stationary 2-4 inch high object to help maintain the best posture. When both feet are on the ground, the low back tends to lose its slight inward curve. If this curve flattens (or becomes too large), then the back and your other joints will experience too much stress, fatigue more quickly and can cause pain.  CORRECT STANDING POSTURES Proper standing posture should be assumed with all daily activities, even if they only  take a few moments, like when brushing your teeth. As in sitting, your ears should fall over your shoulders and your shoulders should fall over your hips. You should keep a slight tension in your abdominal muscles to brace your spine. Your tailbone should point down to the ground, not behind your body, resulting in an over-extended swayback posture.  INCORRECT STANDING POSTURES  Common incorrect standing postures include a forward head, locked knees and/or an excessive swayback. WALKING Walk with an upright posture. Your ears, shoulders and hips should all line-up. PROLONGED ACTIVITY IN A FLEXED POSITION When completing a task that requires you to bend forward at your waist or lean over a low surface, try to find a way to stabilize 3 of 4 of your limbs. You can place a hand or elbow on your thigh or rest a knee on the surface you are reaching across. This will provide you more stability so that your muscles do not fatigue as quickly. By keeping your knees relaxed, or slightly bent, you will also reduce stress across your low back. CORRECT LIFTING TECHNIQUES DO :   Assume a wide stance. This will provide you more stability and the opportunity to get as close as possible to the object which you are lifting.  Tense your abdominals to brace your spine; then bend at the knees and hips. Keeping your back locked in a neutral-spine position, lift using your leg muscles. Lift with your legs, keeping your back straight.  Test the weight of unknown objects before attempting  to lift them.  Try to keep your elbows locked down at your sides in order get the best strength from your shoulders when carrying an object.  Always ask for help when lifting heavy or awkward objects. INCORRECT LIFTING TECHNIQUES DO NOT:   Lock your knees when lifting, even if it is a small object.  Bend and twist. Pivot at your feet or move your feet when needing to change directions.  Assume that you cannot safely pick up a paperclip without proper posture.   This information is not intended to replace advice given to you by your health care provider. Make sure you discuss any questions you have with your health care provider.   Document Released: 11/20/2005 Document Revised: 04/06/2015 Document Reviewed: 03/04/2009 Elsevier Interactive Patient Education [employer].

## 2016-04-23 ENCOUNTER — Other Ambulatory Visit: Payer: Self-pay | Admitting: Endocrinology

## 2016-04-23 ENCOUNTER — Other Ambulatory Visit: Payer: Self-pay | Admitting: Internal Medicine

## 2016-04-24 NOTE — Telephone Encounter (Signed)
Please refill x 1 Ov is due  

## 2016-04-24 NOTE — Telephone Encounter (Signed)
Please advise if ok to refill. Last office visit was 04/28/2015. Thanks!

## 2016-04-25 ENCOUNTER — Telehealth: Payer: Self-pay | Admitting: Internal Medicine

## 2016-04-25 NOTE — Telephone Encounter (Signed)
Rx submitted and appointment letter mailed.

## 2016-04-25 NOTE — Telephone Encounter (Signed)
Patient is requesting Hep C screening.

## 2016-04-27 NOTE — Telephone Encounter (Signed)
Called patient to inform him that we can do this at his next office visit.

## 2016-05-12 ENCOUNTER — Encounter: Payer: Self-pay | Admitting: Family

## 2016-05-12 ENCOUNTER — Ambulatory Visit (INDEPENDENT_AMBULATORY_CARE_PROVIDER_SITE_OTHER): Admitting: Family

## 2016-05-12 VITALS — BP 142/82 | HR 61 | Temp 97.5°F | Resp 16 | Ht 69.0 in | Wt 222.0 lb

## 2016-05-12 DIAGNOSIS — M5442 Lumbago with sciatica, left side: Secondary | ICD-10-CM

## 2016-05-12 MED ORDER — DICLOFENAC SODIUM 2 % TD SOLN: 1.0000 "application " | Freq: Two times a day (BID) | TRANSDERMAL | Status: DC | PRN

## 2016-05-12 NOTE — Progress Notes (Signed)
Pre visit review using our clinic review tool, if applicable. No additional management support is needed unless otherwise documented below in the visit note. 

## 2016-05-12 NOTE — Patient Instructions (Signed)
Thank you for choosing [facility].  Summary/Instructions:  Pennsaid 2x per day about 1/2 pack to the affected area  Continue exercise.  They will call to schedule physical therapy and an appointment with the back specialist.   Your prescription(s) have been submitted to your pharmacy or been printed and provided for you. Please take as directed and contact our office if you believe you are having problem(s) with the medication(s) or have any questions.   If your symptoms worsen or fail to improve, please contact our office for further instruction, or in case of emergency go directly to the emergency room at the closest medical facility.

## 2016-05-12 NOTE — Progress Notes (Signed)
Subjective:    Patient ID: Hector [name], male    DOB: [birth year], 65 y.o.   MRN: [id number]  Chief Complaint  Patient presents with  . Hip Pain    left hip pain that has been bothering him a couple months now, medication given at last visit for the pain does not really help much    HPI:  Hector [name] is a 65 y.o. male who  has a past medical history of Arthritis; Hypertension; Hyperlipidemia; Chronic kidney disease; and Tuberculosis. and presents today for a follow up office visit.  Previously seen in the office and diagnosed with left sided back pain and sciatica. Reports taking the prednisone and cyclobenzaprine as prescribed without significant side effects or improvements. Pain continues to be sharp and located on the left side. Timing of the symptoms is constant and worse in the morning. He is able to walk 3 miles per day which seems to help and also takes Tylenol as needed.  Allergies  Allergen Reactions  . Shellfish Allergy Hives     Current Outpatient Prescriptions on File Prior to Visit  Medication Sig Dispense Refill  . aspirin 81 MG tablet Take 81 mg by mouth daily.    . cetirizine (ZYRTEC) 10 MG tablet Take 10 mg by mouth daily.    . cyclobenzaprine (FLEXERIL) 10 MG tablet Take 0.5-1 tablets (5-10 mg total) by mouth 3 (three) times daily as needed for muscle spasms. 40 tablet 0  . fluticasone (FLONASE) 50 MCG/ACT nasal spray USE 2 SPRAYS IN EACH NOSTRIL DAILY 16 g 3  . furosemide (LASIX) 20 MG tablet TAKE 1 TABLET DAILY 30 tablet 1  . lisinopril (PRINIVIL,ZESTRIL) 40 MG tablet TAKE 1 TABLET DAILY 90 tablet 0  . lovastatin (MEVACOR) 40 MG tablet Take 0.5 tablets (20 mg total) by mouth at bedtime. 45 tablet 3  . meloxicam (MOBIC) 15 MG tablet Take 15 mg by mouth daily.    . pantoprazole (PROTONIX) 40 MG tablet TAKE 1 TABLET DAILY 90 tablet 2  . ranitidine (ZANTAC) 300 MG tablet Take 150 mg by mouth 2 (two) times daily.    Marland Kitchen terazosin (HYTRIN) 10 MG capsule Take 10 mg  by mouth at bedtime.     No current facility-administered medications on file prior to visit.     Review of Systems  Constitutional: Negative for fever and chills.  Musculoskeletal: Positive for back pain.  Neurological: Negative for weakness and numbness.      Objective:    BP 142/82 mmHg  Pulse 61  Temp(Src) 97.5 F (36.4 C) (Oral)  Resp 16  Ht 5\' 9"  (1.753 m)  Wt 222 lb (100.699 kg)  BMI 32.77 kg/m2  SpO2 97% Nursing note and vital signs reviewed.  Physical Exam  Constitutional: He is oriented to person, place, and time. He appears well-developed and well-nourished. No distress.  Cardiovascular: Normal rate, regular rhythm, normal heart sounds and intact distal pulses.   Pulmonary/Chest: Effort normal and breath sounds normal.  Musculoskeletal:  Low back pain - increased lordotic curve noted with no obvious deformities, discoloration, or edema. Palpable tenderness along lumbar paraspinal musculature and slightly on SI joint. Discomfort also noted with palpation across sciatic nerve distribution into the gluteal region. Range of motion is normal with discomfort noted in extension and lateral bending to the left side. Straight leg raise is negative. Faber's test is positive on the left. Distal pulses and sensation are intact and appropriate.  Neurological: He is alert and oriented to person, place, and  time.  Skin: Skin is warm and dry.  Psychiatric: He has a normal mood and affect. His behavior is normal. Judgment and thought content normal.       Assessment & Plan:   Problem List Items Addressed This Visit      Other   Low back pain - Primary    Continues to experience sciactica which may be related to piriformis or SI irritation. It continues to be improved with exercise and exacerbated by sitting. Start pennsaid. Continue current dosage of meloxicam and cyclobenzaprine. Continue home exercise therapy. Refer to physical therapy and back specialist for possible steroid  injection. Follow up for symptom worsened.       Relevant Medications   Diclofenac Sodium (PENNSAID) 2 % SOLN   Other Relevant Orders   AMB referral to orthopedics   Ambulatory referral to Physical Therapy       I have discontinued Mr. Loschiavo's benzonatate and predniSONE. I am also having him start on Diclofenac Sodium. Additionally, I am having him maintain his ranitidine, meloxicam, terazosin, cetirizine, aspirin, lovastatin, furosemide, pantoprazole, cyclobenzaprine, lisinopril, and fluticasone.   Meds ordered this encounter  Medications  . Diclofenac Sodium (PENNSAID) 2 % SOLN    Sig: Place 1 application onto the skin 2 (two) times daily as needed.    Dispense:  112 g    Refill:  1    Order Specific Question:  Supervising Provider    Answer:  Pricilla Holm A [id number]     Follow-up: Return if symptoms worsen or fail to improve.  Mauricio Po, FNP

## 2016-05-12 NOTE — Assessment & Plan Note (Signed)
Continues to experience sciactica which may be related to piriformis or SI irritation. It continues to be improved with exercise and exacerbated by sitting. Start pennsaid. Continue current dosage of meloxicam and cyclobenzaprine. Continue home exercise therapy. Refer to physical therapy and back specialist for possible steroid injection. Follow up for symptom worsened.

## 2016-05-16 ENCOUNTER — Encounter: Payer: Self-pay | Admitting: Endocrinology

## 2016-05-16 ENCOUNTER — Ambulatory Visit (INDEPENDENT_AMBULATORY_CARE_PROVIDER_SITE_OTHER): Admitting: Endocrinology

## 2016-05-16 ENCOUNTER — Encounter: Payer: Self-pay | Admitting: Internal Medicine

## 2016-05-16 VITALS — BP 138/90 | HR 79 | Temp 97.5°F | Ht 69.0 in | Wt 216.5 lb

## 2016-05-16 DIAGNOSIS — E213 Hyperparathyroidism, unspecified: Secondary | ICD-10-CM | POA: Diagnosis not present

## 2016-05-16 LAB — BASIC METABOLIC PANEL
BUN: 17 mg/dL (ref 6–23)
CALCIUM: 10.4 mg/dL (ref 8.4–10.5)
CO2: 29 mEq/L (ref 19–32)
Chloride: 106 mEq/L (ref 96–112)
Creatinine, Ser: 1.05 mg/dL (ref 0.40–1.50)
GFR: 91.63 mL/min (ref >60.00)
Glucose, Bld: 92 mg/dL (ref 70–99)
Potassium: 3.9 mEq/L (ref 3.5–5.1)
SODIUM: 142 meq/L (ref 135–145)

## 2016-05-16 LAB — VITAMIN D 25 HYDROXY (VIT D DEFICIENCY, FRACTURES): VITD: 25.94 ng/mL — AB (ref 30.00–100.00)

## 2016-05-16 MED ORDER — AMLODIPINE BESYLATE 2.5 MG PO TABS: 2.5000 mg | ORAL_TABLET | Freq: Every day | ORAL | Status: DC

## 2016-05-16 NOTE — Patient Instructions (Signed)
i have sent a prescription to your pharmacy, for an additional blood pressure pill. blood tests are requested for you today.  We'll let you know about the results. Please come back for a follow-up appointment in 3 months.

## 2016-05-16 NOTE — Progress Notes (Signed)
Subjective:    Patient ID: Hector [name], male    DOB: 1951/01/21, 65 y.o.   MRN: [id number]  HPI  The state of at least three ongoing medical problems is addressed today, with interval history of each noted here: Primary hyperparathyroidism: HCTZ was changed to lasix Vit-D deficiency: he denies cramps.  He has stopped vit-D pills HTN: He gets good diuretic effect from lasix Hypokalemia: denies edema Past Medical History  Diagnosis Date  . Arthritis   . Hypertension   . Hyperlipidemia   . Chronic kidney disease   . Tuberculosis     Past Surgical History  Procedure Laterality Date  . Prostate surgery  12/2012    no cancer    Social History   Social History  . Marital Status: Married    Spouse Name: N/A  . Number of Children: 2  . Years of Education: N/A   Occupational History  . Not on file.   Social History Main Topics  . Smoking status: Never Smoker   . Smokeless tobacco: Never Used  . Alcohol Use: No  . Drug Use: No  . Sexual Activity: Not on file   Other Topics Concern  . Not on file   Social History Narrative    Current Outpatient Prescriptions on File Prior to Visit  Medication Sig Dispense Refill  . aspirin 81 MG tablet Take 81 mg by mouth daily.    . cetirizine (ZYRTEC) 10 MG tablet Take 10 mg by mouth daily.    . Diclofenac Sodium (PENNSAID) 2 % SOLN Place 1 application onto the skin 2 (two) times daily as needed. 112 g 1  . fluticasone (FLONASE) 50 MCG/ACT nasal spray USE 2 SPRAYS IN EACH NOSTRIL DAILY 16 g 3  . lisinopril (PRINIVIL,ZESTRIL) 40 MG tablet TAKE 1 TABLET DAILY 90 tablet 0  . lovastatin (MEVACOR) 40 MG tablet Take 0.5 tablets (20 mg total) by mouth at bedtime. 45 tablet 3  . meloxicam (MOBIC) 15 MG tablet Take 15 mg by mouth daily.    . pantoprazole (PROTONIX) 40 MG tablet TAKE 1 TABLET DAILY 90 tablet 2  . terazosin (HYTRIN) 10 MG capsule Take 10 mg by mouth at bedtime.    . cyclobenzaprine (FLEXERIL) 10 MG tablet Take 0.5-1 tablets  (5-10 mg total) by mouth 3 (three) times daily as needed for muscle spasms. (Patient not taking: Reported on 05/16/2016) 40 tablet 0   No current facility-administered medications on file prior to visit.    Allergies  Allergen Reactions  . Shellfish Allergy Hives    Family History  Problem Relation Age of Onset  . Arthritis Mother   . Hyperlipidemia Mother   . Heart disease Mother   . Arthritis Father   . Hyperlipidemia Father   . Heart disease Father   . Stomach cancer Mother   . Colon polyps Mother   . Kidney disease Father   . Hypertension Other     siblings  . Breast cancer Sister   . Ovarian cancer Sister   . Gallbladder disease Sister     x 6  . Other Neg Hx     hypercalcemia    BP 138/90 mmHg  Pulse 79  Temp(Src) 97.5 F (36.4 C) (Oral)  Ht 5\' 9"  (1.753 m)  Wt 216 lb 8 oz (98.204 kg)  BMI 31.96 kg/m2  SpO2 97%   Review of Systems Denies sob and numbness    Objective:   Physical Exam VITAL SIGNS:  See vs page GENERAL: no  distress Ext: no edema   i personally reviewed electrocardiogram tracing (2015): Indication: shoulder pain Impression: SB  Vit-D=26 Lab Results  Component Value Date   PTH 117* 05/16/2016   CALCIUM 10.0 05/16/2016   CALCIUM 10.4 05/16/2016   Lab Results  Component Value Date   CREATININE 1.05 05/16/2016   BUN 17 05/16/2016   NA 142 05/16/2016   K 3.9 05/16/2016   CL 106 05/16/2016   CO2 29 05/16/2016      Assessment & Plan:  HTN: he needs increased rx Vit-D deficiency: persistent. Take 2000 units/day Hyperparathyroidism: mild. We'll follow  Patient is advised the following: Patient Instructions  i have sent a prescription to your pharmacy, for an additional blood pressure pill. blood tests are requested for you today.  We'll let you know about the results. Please come back for a follow-up appointment in 3 months.   Renato Shin, MD

## 2016-05-16 NOTE — Progress Notes (Signed)
Pre visit review using our clinic review tool, if applicable. No additional management support is needed unless otherwise documented below in the visit note. 

## 2016-05-17 ENCOUNTER — Other Ambulatory Visit: Payer: Self-pay | Admitting: Endocrinology

## 2016-05-17 ENCOUNTER — Encounter: Payer: Self-pay | Admitting: Family

## 2016-05-17 LAB — PTH, INTACT AND CALCIUM
CALCIUM: 10 mg/dL (ref 8.4–10.5)
PTH: 117 pg/mL — AB (ref 14–64)

## 2016-06-02 ENCOUNTER — Other Ambulatory Visit: Payer: Self-pay | Admitting: Endocrinology

## 2016-06-07 MED ORDER — FUROSEMIDE 20 MG PO TABS
20.0000 mg | ORAL_TABLET | Freq: Every day | ORAL | Status: DC
Start: 2016-06-07 — End: 2016-08-31

## 2016-06-07 NOTE — Addendum Note (Signed)
Addended by: Aviva Signs M on: 06/07/2016 12:01 PM   Modules accepted: Orders

## 2016-06-09 ENCOUNTER — Other Ambulatory Visit (INDEPENDENT_AMBULATORY_CARE_PROVIDER_SITE_OTHER)

## 2016-06-09 ENCOUNTER — Ambulatory Visit (INDEPENDENT_AMBULATORY_CARE_PROVIDER_SITE_OTHER): Admitting: Internal Medicine

## 2016-06-09 ENCOUNTER — Encounter: Payer: Self-pay | Admitting: Internal Medicine

## 2016-06-09 VITALS — BP 138/88 | HR 63 | Temp 98.5°F | Resp 14 | Ht 69.0 in | Wt 219.0 lb

## 2016-06-09 DIAGNOSIS — Z0000 Encounter for general adult medical examination without abnormal findings: Secondary | ICD-10-CM

## 2016-06-09 DIAGNOSIS — E785 Hyperlipidemia, unspecified: Secondary | ICD-10-CM | POA: Diagnosis not present

## 2016-06-09 DIAGNOSIS — Z1159 Encounter for screening for other viral diseases: Secondary | ICD-10-CM

## 2016-06-09 DIAGNOSIS — M5442 Lumbago with sciatica, left side: Secondary | ICD-10-CM

## 2016-06-09 DIAGNOSIS — I10 Essential (primary) hypertension: Secondary | ICD-10-CM

## 2016-06-09 LAB — HEMOGLOBIN A1C: Hgb A1c MFr Bld: 5.7 % (ref 4.6–6.5)

## 2016-06-09 NOTE — Assessment & Plan Note (Signed)
Taking lovastatin 40 mg daily. No side effects.

## 2016-06-09 NOTE — Assessment & Plan Note (Signed)
Talked to him about core exercise training. He will see orthopedics sooner.

## 2016-06-09 NOTE — Assessment & Plan Note (Signed)
Checking hep c screening test. Recent lipid panel normal. Counseled on the need for exercise and diet to help with his weight. [facility] he [facility]. Colonoscopy up to date.

## 2016-06-09 NOTE — Assessment & Plan Note (Signed)
BP at goal on amlodipine low dose. We talked again about stopping that if he could lose weight.

## 2016-06-09 NOTE — Progress Notes (Signed)
Pre visit review using our clinic review tool, if applicable. No additional management support is needed unless otherwise documented below in the visit note. 

## 2016-06-09 NOTE — Patient Instructions (Signed)
We are checking the labs today as we talked about.  Keep up the good work with exercise and consider some yoga or balancing on one foot while lifting to add some exercise to your core muscles to help your back.  Health Maintenance, Male A healthy lifestyle and preventative care can promote health and wellness.  Maintain regular health, dental, and eye exams.  Eat a healthy diet. Foods like vegetables, fruits, whole grains, low-fat dairy products, and lean protein foods contain the nutrients you need and are low in calories. Decrease your intake of foods high in solid fats, added sugars, and salt. Get information about a proper diet from your health care provider, if necessary.  Regular physical exercise is one of the most important things you can do for your health. Most adults should get at least 150 minutes of moderate-intensity exercise (any activity that increases your heart rate and causes you to sweat) each week. In addition, most adults need muscle-strengthening exercises on 2 or more days a week.   Maintain a healthy weight. The body mass index (BMI) is a screening tool to identify possible weight problems. It provides an estimate of body fat based on height and weight. Your health care provider can find your BMI and can help you achieve or maintain a healthy weight. For males 20 years and older:  A BMI below 18.5 is considered underweight.  A BMI of 18.5 to 24.9 is normal.  A BMI of 25 to 29.9 is considered overweight.  A BMI of 30 and above is considered obese.  Maintain normal blood lipids and cholesterol by exercising and minimizing your intake of saturated fat. Eat a balanced diet with plenty of fruits and vegetables. Blood tests for lipids and cholesterol should begin at age 57 and be repeated every 5 years. If your lipid or cholesterol levels are high, you are over age 36, or you are at high risk for heart disease, you may need your cholesterol levels checked more  frequently.Ongoing high lipid and cholesterol levels should be treated with medicines if diet and exercise are not working.  If you smoke, find out from your health care provider how to quit. If you do not use tobacco, do not start.  Lung cancer screening is recommended for adults aged 52-80 years who are at high risk for developing lung cancer because of a history of smoking. A yearly low-dose CT scan of the lungs is recommended for people who have at least a 30-pack-year history of smoking and are current smokers or have quit within the past 15 years. A pack year of smoking is smoking an average of 1 pack of cigarettes a day for 1 year (for example, a 30-pack-year history of smoking could mean smoking 1 pack a day for 30 years or 2 packs a day for 15 years). Yearly screening should continue until the smoker has stopped smoking for at least 15 years. Yearly screening should be stopped for people who develop a health problem that would prevent them from having lung cancer treatment.  If you choose to drink alcohol, do not have more than 2 drinks per day. One drink is considered to be 12 oz (360 mL) of beer, 5 oz (150 mL) of wine, or 1.5 oz (45 mL) of liquor.  Avoid the use of street drugs. Do not share needles with anyone. Ask for help if you need support or instructions about stopping the use of drugs.  High blood pressure causes heart disease and increases the  risk of stroke. High blood pressure is more likely to develop in:  People who have blood pressure in the end of the normal range (100-139/85-89 mm Hg).  People who are overweight or obese.  People who are African American.  If you are 39-26 years of age, have your blood pressure checked every 3-5 years. If you are 65 years of age or older, have your blood pressure checked every year. You should have your blood pressure measured twice--once when you are at a hospital or clinic, and once when you are not at a hospital or clinic. Record the  average of the two measurements. To check your blood pressure when you are not at a hospital or clinic, you can use:  An automated blood pressure machine at a pharmacy.  A home blood pressure monitor.  If you are 68-68 years old, ask your health care provider if you should take aspirin to prevent heart disease.  Diabetes screening involves taking a blood sample to check your fasting blood sugar level. This should be done once every 3 years after age 24 if you are at a normal weight and without risk factors for diabetes. Testing should be considered at a younger age or be carried out more frequently if you are overweight and have at least 1 risk factor for diabetes.  Colorectal cancer can be detected and often prevented. Most routine colorectal cancer screening begins at the age of 42 and continues through age 20. However, your health care provider may recommend screening at an earlier age if you have risk factors for colon cancer. On a yearly basis, your health care provider may provide home test kits to check for hidden blood in the stool. A small camera at the end of a tube may be used to directly examine the colon (sigmoidoscopy or colonoscopy) to detect the earliest forms of colorectal cancer. Talk to your health care provider about this at age 87 when routine screening begins. A direct exam of the colon should be repeated every 5-10 years through age 57, unless early forms of precancerous polyps or small growths are found.  People who are at an increased risk for hepatitis B should be screened for this virus. You are considered at high risk for hepatitis B if:  You were born in a country where hepatitis B occurs often. Talk with your health care provider about which countries are considered high risk.  Your parents were born in a high-risk country and you have not received a shot to protect against hepatitis B (hepatitis B vaccine).  You have HIV or AIDS.  You use needles to inject street  drugs.  You live with, or have sex with, someone who has hepatitis B.  You are a man who has sex with other men (MSM).  You get hemodialysis treatment.  You take certain medicines for conditions like cancer, organ transplantation, and autoimmune conditions.  Hepatitis C blood testing is recommended for all people born from [birth year] through 1963 and any individual with known risk factors for hepatitis C.  Healthy men should no longer receive prostate-specific antigen (PSA) blood tests as part of routine cancer screening. Talk to your health care provider about prostate cancer screening.  Testicular cancer screening is not recommended for adolescents or adult males who have no symptoms. Screening includes self-exam, a health care provider exam, and other screening tests. Consult with your health care provider about any symptoms you have or any concerns you have about testicular cancer.  Practice safe  sex. Use condoms and avoid high-risk sexual practices to reduce the spread of sexually transmitted infections (STIs).  You should be screened for STIs, including gonorrhea and chlamydia if:  You are sexually active and are younger than 24 years.  You are older than 24 years, and your health care provider tells you that you are at risk for this type of infection.  Your sexual activity has changed since you were last screened, and you are at an increased risk for chlamydia or gonorrhea. Ask your health care provider if you are at risk.  If you are at risk of being infected with HIV, it is recommended that you take a prescription medicine daily to prevent HIV infection. This is called pre-exposure prophylaxis (PrEP). You are considered at risk if:  You are a man who has sex with other men (MSM).  You are a heterosexual man who is sexually active with multiple partners.  You take drugs by injection.  You are sexually active with a partner who has HIV.  Talk with your health care provider about  whether you are at high risk of being infected with HIV. If you choose to begin PrEP, you should first be tested for HIV. You should then be tested every 3 months for as long as you are taking PrEP.  Use sunscreen. Apply sunscreen liberally and repeatedly throughout the day. You should seek shade when your shadow is shorter than you. Protect yourself by wearing long sleeves, pants, a wide-brimmed hat, and sunglasses year round whenever you are outdoors.  Tell your health care provider of new moles or changes in moles, especially if there is a change in shape or color. Also, tell your health care provider if a mole is larger than the size of a pencil eraser.  A one-time screening for abdominal aortic aneurysm (AAA) and surgical repair of large AAAs by ultrasound is recommended for men aged 33-75 years who are current or former smokers.  Stay current with your vaccines (immunizations).   This information is not intended to replace advice given to you by your health care provider. Make sure you discuss any questions you have with your health care provider.   Document Released: 05/18/2008 Document Revised: 12/11/2014 Document Reviewed: 04/17/2011 Elsevier Interactive Patient Education [employer].

## 2016-06-09 NOTE — Progress Notes (Signed)
   Subjective:    Patient ID: Hector [name], male    DOB: 04-08-1951, 65 y.o.   MRN: [id number]  HPI The patient is a 65 YO man coming in for wellness. Wants the hep c screening. [facility] and [facility] and it was okay.  PMH, [facility], social history reviewed and updated.   Review of Systems  Constitutional: Negative for activity change, appetite change, fatigue and unexpected weight change.  HENT: Negative for congestion, facial swelling, postnasal drip and sore throat.   Eyes: Negative.   Respiratory: Negative for cough, chest tightness, shortness of breath and wheezing.   Cardiovascular: Negative for chest pain, palpitations and leg swelling.  Gastrointestinal: Negative.   Musculoskeletal: Negative.   Skin: Negative.   Neurological: Negative.   Psychiatric/Behavioral: Negative.       Objective:   Physical Exam  Constitutional: He is oriented to person, place, and time. He appears well-developed and well-nourished.  HENT:  Head: Normocephalic and atraumatic.  Bilateral ears TMs normal   Eyes: EOM are normal.  Neck: Normal range of motion.  Cardiovascular: Normal rate and regular rhythm.   Pulmonary/Chest: Effort normal and breath sounds normal. No respiratory distress. He has no wheezes. He has no rales.  Abdominal: Soft. Bowel sounds are normal. He exhibits no distension. There is no tenderness. There is no rebound.  Musculoskeletal: He exhibits no edema.  Lymphadenopathy:    He has no cervical adenopathy.  Neurological: He is alert and oriented to person, place, and time. Coordination normal.  Skin: Skin is warm and dry.  Psychiatric: He has a normal mood and affect.   Filed Vitals:   06/09/16 1013  BP: 138/88  Pulse: 63  Temp: 98.5 F (36.9 C)  TempSrc: Oral  Resp: 14  Height: 5\' 9"  (1.753 m)  Weight: 219 lb (99.338 kg)  SpO2: 98%      Assessment & Plan:

## 2016-06-10 LAB — HEPATITIS C ANTIBODY: HCV AB: NEGATIVE

## 2016-06-11 ENCOUNTER — Encounter: Payer: Self-pay | Admitting: Internal Medicine

## 2016-06-12 MED ORDER — TRIAMCINOLONE ACETONIDE 0.1 % EX CREA: 1.0000 "application " | TOPICAL_CREAM | Freq: Two times a day (BID) | CUTANEOUS | Status: DC

## 2016-07-03 ENCOUNTER — Other Ambulatory Visit: Payer: Self-pay | Admitting: Endocrinology

## 2016-07-24 ENCOUNTER — Other Ambulatory Visit: Payer: Self-pay | Admitting: Endocrinology

## 2016-08-16 ENCOUNTER — Encounter: Payer: Self-pay | Admitting: Endocrinology

## 2016-08-16 ENCOUNTER — Ambulatory Visit (INDEPENDENT_AMBULATORY_CARE_PROVIDER_SITE_OTHER): Admitting: Endocrinology

## 2016-08-16 VITALS — BP 136/84 | HR 68 | Ht 69.0 in | Wt 220.0 lb

## 2016-08-16 DIAGNOSIS — E559 Vitamin D deficiency, unspecified: Secondary | ICD-10-CM | POA: Diagnosis not present

## 2016-08-16 DIAGNOSIS — E213 Hyperparathyroidism, unspecified: Secondary | ICD-10-CM

## 2016-08-16 LAB — BASIC METABOLIC PANEL
BUN: 16 mg/dL (ref 6–23)
CHLORIDE: 104 meq/L (ref 96–112)
CO2: 30 mEq/L (ref 19–32)
Calcium: 10.2 mg/dL (ref 8.4–10.5)
Creatinine, Ser: 1.03 mg/dL (ref 0.40–1.50)
GFR: 93.61 mL/min (ref >60.00)
GLUCOSE: 96 mg/dL (ref 70–99)
POTASSIUM: 4 meq/L (ref 3.5–5.1)
Sodium: 141 mEq/L (ref 135–145)

## 2016-08-16 LAB — VITAMIN D 25 HYDROXY (VIT D DEFICIENCY, FRACTURES): VITD: 30.88 ng/mL (ref 30.00–100.00)

## 2016-08-16 NOTE — Patient Instructions (Signed)
blood tests are requested for you today.  We'll let you know about the results. Please come back for a follow-up appointment in 1 year.   

## 2016-08-16 NOTE — Progress Notes (Signed)
Subjective:    Patient ID: Hector [name], male    DOB: 1951/03/13, 65 y.o.   MRN: [id number]  HPI  The state of at least three ongoing medical problems is addressed today, with interval history of each noted here:  Primary hyperparathyroidism (dx'ed 2013; he has never had urolithiasis or bony fracture; HCTZ was changed to lasix; DEXA was normal in 2016).  Vit-D deficiency (he has had fx left little finger, coccyx, and left great toe--all with significant injuries): he denies cramps.  He takes vit-D, 2000 units/d.  HTN: He gets good diuretic effect from lasix.  Hypokalemia: denies edema.     Past Medical History:  Diagnosis Date  . Arthritis   . Chronic kidney disease   . Hyperlipidemia   . Hypertension   . Tuberculosis     Past Surgical History:  Procedure Laterality Date  . PROSTATE SURGERY  12/2012   no cancer    Social History   Social History  . Marital status: Married    Spouse name: N/A  . Number of children: 2  . Years of education: N/A   Occupational History  . Not on file.   Social History Main Topics  . Smoking status: Never Smoker  . Smokeless tobacco: Never Used  . Alcohol use No  . Drug use: No  . Sexual activity: Not on file   Other Topics Concern  . Not on file   Social History Narrative  . No narrative on file    Current Outpatient Prescriptions on File Prior to Visit  Medication Sig Dispense Refill  . amLODipine (NORVASC) 2.5 MG tablet Take 1 tablet (2.5 mg total) by mouth daily. 90 tablet 3  . aspirin 81 MG tablet Take 81 mg by mouth daily.    . cetirizine (ZYRTEC) 10 MG tablet Take 10 mg by mouth daily.    . fluticasone (FLONASE) 50 MCG/ACT nasal spray USE 2 SPRAYS IN EACH NOSTRIL DAILY 16 g 3  . furosemide (LASIX) 20 MG tablet Take 1 tablet (20 mg total) by mouth daily. 90 tablet 0  . lisinopril (PRINIVIL,ZESTRIL) 40 MG tablet TAKE 1 TABLET DAILY 90 tablet 3  . lovastatin (MEVACOR) 40 MG tablet Take 0.5 tablets (20 mg total) by mouth at  bedtime. 45 tablet 3  . meloxicam (MOBIC) 15 MG tablet Take 15 mg by mouth daily.    . pantoprazole (PROTONIX) 40 MG tablet TAKE 1 TABLET DAILY 90 tablet 2  . terazosin (HYTRIN) 10 MG capsule Take 10 mg by mouth at bedtime.    . triamcinolone cream (KENALOG) 0.1 % Apply 1 application topically 2 (two) times daily. 80 g 6   No current facility-administered medications on file prior to visit.     Allergies  Allergen Reactions  . Shellfish Allergy Hives    Family History  Problem Relation Age of Onset  . Arthritis Mother   . Hyperlipidemia Mother   . Heart disease Mother   . Arthritis Father   . Hyperlipidemia Father   . Heart disease Father   . Stomach cancer Mother   . Colon polyps Mother   . Kidney disease Father   . Hypertension Other     siblings  . Breast cancer Sister   . Ovarian cancer Sister   . Gallbladder disease Sister     x 6  . Other Neg Hx     hypercalcemia    BP 136/84   Pulse 68   Ht 5\' 9"  (1.753 m)   Wt  220 lb (99.8 kg)   SpO2 97%   BMI 32.49 kg/m    Review of Systems Denies sob and     Objective:   Physical Exam VITAL SIGNS:  See vs page GENERAL: no distress Chest wall: no kyphosis.    Lab Results  Component Value Date   PTH 113 (H) 08/16/2016   CALCIUM 10.0 08/16/2016   CALCIUM 10.2 08/16/2016   Lab Results  Component Value Date   CREATININE 1.03 08/16/2016   BUN 16 08/16/2016   NA 141 08/16/2016   K 4.0 08/16/2016   CL 104 08/16/2016   CO2 30 08/16/2016   Vit-D=31    Assessment & Plan:  HTN: well-controlled Vit-D deficiency: well-replaced. Hyperparathyroidism: mild. We'll follow  Please continue the same medications

## 2016-08-17 LAB — PTH, INTACT AND CALCIUM
Calcium: 10 mg/dL (ref 8.6–10.3)
PTH: 113 pg/mL — ABNORMAL HIGH (ref 14–64)

## 2016-08-31 ENCOUNTER — Other Ambulatory Visit: Payer: Self-pay | Admitting: Endocrinology

## 2016-09-20 ENCOUNTER — Other Ambulatory Visit: Payer: Self-pay | Admitting: Internal Medicine

## 2016-10-03 ENCOUNTER — Ambulatory Visit (INDEPENDENT_AMBULATORY_CARE_PROVIDER_SITE_OTHER)

## 2016-10-03 DIAGNOSIS — Z23 Encounter for immunization: Secondary | ICD-10-CM | POA: Diagnosis not present

## 2016-11-08 ENCOUNTER — Other Ambulatory Visit: Payer: Self-pay | Admitting: Endocrinology

## 2016-12-18 ENCOUNTER — Other Ambulatory Visit: Payer: Self-pay | Admitting: Internal Medicine

## 2016-12-18 ENCOUNTER — Encounter (HOSPITAL_COMMUNITY): Payer: Self-pay

## 2016-12-18 DIAGNOSIS — N189 Chronic kidney disease, unspecified: Secondary | ICD-10-CM | POA: Diagnosis not present

## 2016-12-18 DIAGNOSIS — Z7982 Long term (current) use of aspirin: Secondary | ICD-10-CM | POA: Diagnosis not present

## 2016-12-18 DIAGNOSIS — I129 Hypertensive chronic kidney disease with stage 1 through stage 4 chronic kidney disease, or unspecified chronic kidney disease: Secondary | ICD-10-CM | POA: Insufficient documentation

## 2016-12-18 DIAGNOSIS — E860 Dehydration: Secondary | ICD-10-CM | POA: Diagnosis not present

## 2016-12-18 DIAGNOSIS — Z79899 Other long term (current) drug therapy: Secondary | ICD-10-CM | POA: Insufficient documentation

## 2016-12-18 DIAGNOSIS — R112 Nausea with vomiting, unspecified: Secondary | ICD-10-CM | POA: Diagnosis present

## 2016-12-18 LAB — COMPREHENSIVE METABOLIC PANEL
ALBUMIN: 4 g/dL (ref 3.5–5.0)
ALK PHOS: 83 U/L (ref 38–126)
ALT: 27 U/L (ref 17–63)
AST: 25 U/L (ref 15–41)
Anion gap: 13 (ref 5–15)
BUN: 19 mg/dL (ref 6–20)
CHLORIDE: 105 mmol/L (ref 101–111)
CO2: 21 mmol/L — AB (ref 22–32)
CREATININE: 1.06 mg/dL (ref 0.61–1.24)
Calcium: 10.2 mg/dL (ref 8.9–10.3)
GFR calc Af Amer: >60 mL/min (ref >60)
GFR calc non Af Amer: >60 mL/min (ref >60)
GLUCOSE: 109 mg/dL — AB (ref 65–99)
Potassium: 3.4 mmol/L — ABNORMAL LOW (ref 3.5–5.1)
SODIUM: 139 mmol/L (ref 135–145)
Total Bilirubin: 1.4 mg/dL — ABNORMAL HIGH (ref 0.3–1.2)
Total Protein: 7.7 g/dL (ref 6.5–8.1)

## 2016-12-18 LAB — URINALYSIS, ROUTINE W REFLEX MICROSCOPIC
Bilirubin Urine: NEGATIVE
Glucose, UA: NEGATIVE mg/dL
Hgb urine dipstick: NEGATIVE
KETONES UR: 20 mg/dL — AB
Leukocytes, UA: NEGATIVE
Nitrite: NEGATIVE
PH: 5 (ref 5.0–8.0)
Protein, ur: 30 mg/dL — AB
Specific Gravity, Urine: 1.025 (ref 1.005–1.030)

## 2016-12-18 LAB — LIPASE, BLOOD: LIPASE: 23 U/L (ref 11–51)

## 2016-12-18 LAB — CBC
HCT: 44.4 % (ref 39.0–52.0)
Hemoglobin: 15.1 g/dL (ref 13.0–17.0)
MCH: 28.7 pg (ref 26.0–34.0)
MCHC: 34 g/dL (ref 30.0–36.0)
MCV: 84.3 fL (ref 78.0–100.0)
PLATELETS: 171 10*3/uL (ref 150–400)
RBC: 5.27 MIL/uL (ref 4.22–5.81)
RDW: 13.9 % (ref 11.5–15.5)
WBC: 4.1 10*3/uL (ref 4.0–10.5)

## 2016-12-18 NOTE — ED Triage Notes (Signed)
Pt complaining of nausea and vomiting since yesterday. Pt states 4 episodes of emesis today. Pt also states began having diarrhea x 2. Pt denies any fevers.

## 2016-12-19 ENCOUNTER — Emergency Department (HOSPITAL_COMMUNITY)
Admission: EM | Admit: 2016-12-19 | Discharge: 2016-12-19 | Disposition: A | Discharge status: Home / Self Care | Attending: Emergency Medicine | Admitting: Emergency Medicine

## 2016-12-19 DIAGNOSIS — R112 Nausea with vomiting, unspecified: Secondary | ICD-10-CM

## 2016-12-19 DIAGNOSIS — E860 Dehydration: Secondary | ICD-10-CM

## 2016-12-19 DIAGNOSIS — R197 Diarrhea, unspecified: Secondary | ICD-10-CM

## 2016-12-19 MED ORDER — DIPHENOXYLATE-ATROPINE 2.5-0.025 MG PO TABS: 1.0000 | ORAL_TABLET | Freq: Four times a day (QID) | ORAL | 0 refills | Status: DC | PRN

## 2016-12-19 MED ORDER — ONDANSETRON HCL 4 MG PO TABS: 4.0000 mg | ORAL_TABLET | Freq: Four times a day (QID) | ORAL | 0 refills | Status: DC

## 2016-12-19 MED ORDER — SODIUM CHLORIDE 0.9 % IV BOLUS (SEPSIS)
1000.0000 mL | Freq: Once | INTRAVENOUS | Status: AC
  Administered 2016-12-19: 1000 mL via INTRAVENOUS

## 2016-12-19 MED ORDER — ONDANSETRON HCL 4 MG/2ML IJ SOLN
4.0000 mg | Freq: Once | INTRAMUSCULAR | Status: AC
  Administered 2016-12-19: 4 mg via INTRAVENOUS
  Filled 2016-12-19: qty 2

## 2016-12-19 NOTE — ED Notes (Signed)
Pt verbalized understanding of d/c instructions and has no further questions. Pt is stable, A&Ox4, VSS.  

## 2016-12-19 NOTE — ED Provider Notes (Signed)
[facility] DEPT Provider Note   CSN: [id number] Arrival date & time: 12/18/16  2140  By signing my name below, I, Oleh Genin, attest that this documentation has been prepared under the direction and in the presence of Orpah Greek, MD. Electronically Signed: Oleh Genin, Scribe. 12/19/16. 12:24 AM    History   Chief Complaint Chief Complaint  Patient presents with  . Emesis  . Diarrhea    HPI Hector [name] is a 66 y.o. male with history of HTN, HLD, and hyperparathyroidism presents to the ED with nonbloody diarrhea which started last night. Since waking this morning, the patient has reported several instances of diarrhea as well as vomiting at home. The wife also mentions that she found the patient lying face down after an unwitnessed fall today. The patient is amnesic to his fall. At [place]view, he is reporting anterior chest wall "soreness" as well as epigastric pain. However denies any fevers or chills. 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 He has no other concerns at this time.   The history is provided by the patient. No language interpreter was used.    Past Medical History:  Diagnosis Date  . Arthritis   . Chronic kidney disease   . Hyperlipidemia   . Hypertension   . Tuberculosis     Patient Active Problem List   Diagnosis Date Noted  . Low back pain 03/24/2016  . Hyperparathyroidism ([place]) 04/13/2015  . Hypercalcemia 04/09/2015  . Vitamin D deficiency 04/09/2015  . Osteoarthritis 11/06/2014  . Essential hypertension 11/06/2014  . Hyperlipidemia 11/06/2014  . GERD (gastroesophageal reflux disease) 11/06/2014  . Syncope 11/06/2014  . Routine general medical examination at a health care facility 11/06/2014    Past Surgical History:  Procedure Laterality Date  . PROSTATE SURGERY  12/2012   no cancer       Home Medications    Prior to Admission medications   Medication Sig Start Date End Date Taking? Authorizing  Provider  amLODipine (NORVASC) 2.5 MG tablet Take 1 tablet (2.5 mg total) by mouth daily. 05/16/16  Yes Renato Shin, MD  aspirin 81 MG tablet Take 81 mg by mouth daily.   Yes Historical Provider, MD  cetirizine (ZYRTEC) 10 MG tablet Take 10 mg by mouth daily.   Yes Historical Provider, MD  fluticasone (FLONASE) 50 MCG/ACT nasal spray USE 2 SPRAYS IN EACH NOSTRIL DAILY 09/21/16  Yes Hoyt Koch, MD  furosemide (LASIX) 20 MG tablet TAKE 1 TABLET DAILY 11/08/16  Yes Renato Shin, MD  lisinopril (PRINIVIL,ZESTRIL) 40 MG tablet TAKE 1 TABLET DAILY 07/24/16  Yes Renato Shin, MD  lovastatin (MEVACOR) 40 MG tablet Take 0.5 tablets (20 mg total) by mouth at bedtime. 11/10/14  Yes Hoyt Koch, MD  meloxicam (MOBIC) 15 MG tablet Take 15 mg by mouth daily.   Yes Historical Provider, MD  pantoprazole (PROTONIX) 40 MG tablet TAKE 1 TABLET DAILY 12/18/16  Yes Hoyt Koch, MD  terazosin (HYTRIN) 10 MG capsule Take 10 mg by mouth at bedtime.   Yes Historical Provider, MD  triamcinolone cream (KENALOG) 0.1 % Apply 1 application topically 2 (two) times daily. 06/12/16  Yes Hoyt Koch, MD  diphenoxylate-atropine (LOMOTIL) 2.5-0.025 MG tablet Take 1 tablet by mouth 4 (four) times daily as needed for diarrhea or loose stools. 12/19/16   Orpah Greek, MD  ondansetron (ZOFRAN) 4 MG tablet Take 1 tablet (4 mg total) by mouth every 6 (six) hours. 12/19/16   Gwenyth Allegra  Betsey Holiday, MD    Family History Family History  Problem Relation Age of Onset  . Arthritis Mother   . Hyperlipidemia Mother   . Heart disease Mother   . Stomach cancer Mother   . Colon polyps Mother   . Arthritis Father   . Hyperlipidemia Father   . Heart disease Father   . Kidney disease Father   . Hypertension Other     siblings  . Breast cancer Sister   . Ovarian cancer Sister   . Gallbladder disease Sister     x 6  . Other Neg Hx     hypercalcemia    Social History Social History  Substance Use  Topics  . Smoking status: Never Smoker  . Smokeless tobacco: Never Used  . Alcohol use No     Allergies   Shellfish allergy   Review of Systems Review of Systems  Constitutional: Negative for chills and fever.  Cardiovascular: Positive for chest pain.  Gastrointestinal: Positive for abdominal pain, diarrhea, nausea and vomiting. Negative for anal bleeding.  All other systems reviewed and are negative.    Physical Exam Updated Vital Signs BP 124/78   Pulse (!) 59   Temp 99.2 F (37.3 C) (Oral)   Resp 18   SpO2 100%   Physical Exam  Constitutional: He is oriented to person, place, and time. He appears well-developed and well-nourished. No distress.  HENT:  Head: Normocephalic and atraumatic.  Right Ear: Hearing normal.  Left Ear: Hearing normal.  Nose: Nose normal.  Mouth/Throat: Oropharynx is clear and moist and mucous membranes are normal.  Eyes: Conjunctivae and EOM are normal. Pupils are equal, round, and reactive to light.  Neck: Normal range of motion. Neck supple.  Cardiovascular: Regular rhythm, S1 normal and S2 normal.  Exam reveals no gallop and no friction rub.   No murmur heard. Pulmonary/Chest: Effort normal and breath sounds normal. No respiratory distress. He exhibits no tenderness.  Abdominal: Soft. Normal appearance and bowel sounds are normal. There is no hepatosplenomegaly. There is no rebound, no guarding, no tenderness at McBurney's point and negative Murphy's sign. No hernia.  There is epigastric tenderness on exam.   Musculoskeletal: Normal range of motion.  There is anterior chest wall tenderness on exam.   Neurological: He is alert and oriented to person, place, and time. He has normal strength. No cranial nerve deficit or sensory deficit. Coordination normal. GCS eye subscore is 4. GCS verbal subscore is 5. GCS motor subscore is 6.  Skin: Skin is warm, dry and intact. No rash noted. No cyanosis.  Psychiatric: He has a normal mood and affect. His  speech is normal and behavior is normal. Thought content normal.  Nursing note and vitals reviewed.    ED Treatments / Results  DIAGNOSTIC STUDIES: Oxygen Saturation is 99 percent on room air which is normal by my interpretation.    COORDINATION OF CARE: 12:56 AM Discussed treatment plan with pt at bedside and pt agreed to plan.   Labs (all labs ordered are listed, but only abnormal results are displayed) Labs Reviewed  COMPREHENSIVE METABOLIC PANEL - Abnormal; Notable for the following:       Result Value   Potassium 3.4 (*)    CO2 21 (*)    Glucose, Bld 109 (*)    Total Bilirubin 1.4 (*)    All other components within normal limits  URINALYSIS, ROUTINE W REFLEX MICROSCOPIC - Abnormal; Notable for the following:    APPearance HAZY (*)  Ketones, ur 20 (*)    Protein, ur 30 (*)    Bacteria, UA RARE (*)    Squamous Epithelial / LPF 0-5 (*)    All other components within normal limits  LIPASE, BLOOD  CBC    EKG  EKG Interpretation None       Radiology No results found.  Procedures Procedures (including critical care time)  Medications Ordered in ED Medications  sodium chloride 0.9 % bolus 1,000 mL (0 mLs Intravenous Stopped 12/19/16 0344)  ondansetron (ZOFRAN) injection 4 mg (4 mg Intravenous Given 12/19/16 0107)     Initial Impression / Assessment and Plan / ED Course  I have reviewed the triage vital signs and the nursing notes.  Pertinent labs & imaging results that were available during my care of the patient were reviewed by me and considered in my medical decision making (see chart for details).  Clinical Course    Patient presents to the emergency department for evaluation of generalized weakness. Patient has been experiencing nausea, vomiting and diarrhea since yesterday. Today he stood up, felt weak and nearly passed out. He is not expressing any significant abdominal pain. Abdominal exam is benign, nontender. Blood work unremarkable. Patient feeling  much improvement after IV hydration. No orthostatic hypotension noted. Patient appropriate for discharge, continue symptomatically treatment for presumed viral illness.  Final Clinical Impressions(s) / ED Diagnoses   Final diagnoses:  Nausea vomiting and diarrhea  Dehydration    New Prescriptions New Prescriptions   DIPHENOXYLATE-ATROPINE (LOMOTIL) 2.5-0.025 MG TABLET    Take 1 tablet by mouth 4 (four) times daily as needed for diarrhea or loose stools.   ONDANSETRON (ZOFRAN) 4 MG TABLET    Take 1 tablet (4 mg total) by mouth every 6 (six) hours.  I personally performed the services described in this documentation, which was scribed in my presence. The recorded information has been reviewed and is accurate.     Orpah Greek, MD 12/19/16 [phone]

## 2016-12-21 ENCOUNTER — Ambulatory Visit: Admitting: Gastroenterology

## 2017-01-08 ENCOUNTER — Ambulatory Visit (INDEPENDENT_AMBULATORY_CARE_PROVIDER_SITE_OTHER): Admitting: Gastroenterology

## 2017-01-08 ENCOUNTER — Other Ambulatory Visit: Payer: Self-pay

## 2017-01-08 ENCOUNTER — Encounter: Payer: Self-pay | Admitting: Gastroenterology

## 2017-01-08 VITALS — BP 130/90 | HR 64 | Ht 69.0 in | Wt 220.0 lb

## 2017-01-08 DIAGNOSIS — K219 Gastro-esophageal reflux disease without esophagitis: Secondary | ICD-10-CM | POA: Diagnosis not present

## 2017-01-08 DIAGNOSIS — Z860100 Personal history of colon polyps, unspecified: Secondary | ICD-10-CM

## 2017-01-08 NOTE — Progress Notes (Signed)
Hector [name]  Chief Complaint: GERD and history of colon polyps  Subjective  History:  Mr. Hector [name] sees me for the first time, having seen Dr. Deatra [name] in March 2016. See that [name] for details, also records of his prior colonoscopy and [place] were reference at that time and reviewed today. In March 2014 a polyp was reportedly ablated during a colonoscopy, thus no pathology is known. He has occasional heartburn that is under control with once daily Protonix. He denies dysphagia since his upper endoscopy with dilation by Dr. Deatra [name] in 2016.  ROS: Cardiovascular:  no chest pain Respiratory: no dyspnea  The patient's Past Medical, Family and Social History were reviewed and are on file in the EMR.  Objective:  Med list reviewed  Vital signs in last 24 hrs: Vitals:   01/08/17 1038  BP: 130/90  Pulse: 64    Physical Exam    HEENT: sclera anicteric, oral mucosa moist without lesions  Neck: supple, no thyromegaly, JVD or lymphadenopathy  Cardiac: RRR without murmurs, S1S2 heard, no peripheral edema  Pulm: clear to auscultation bilaterally, normal RR and effort noted  Abdomen: soft, No tenderness, with active bowel sounds. No guarding or palpable hepatosplenomegaly.  Skin; warm and dry, no jaundice or rash  @ASSESSMENTPLANBEGIN @ Assessment: Encounter Diagnoses  Name Primary?  . Gastroesophageal reflux disease without esophagitis Yes  . Hx of colonic polyps     His conditions are stable right now, no further workup necessary at this point.  Plan: We will recall him in 1 year for surveillance colonoscopy. I explained how we must assume the polyp may have been adenomatous in this scenario, thus we bring him back in a 5 year recall.   Total time 15 minutes, over half spent in counseling and coordination of care.   Hector [name]

## 2017-01-08 NOTE — Patient Instructions (Signed)
If you are age 64 or older, your body mass index should be between 23-30. Your Body mass index is 32.49 kg/m. If this is out of the aforementioned range listed, please consider follow up with your Primary Care Provider.  If you are age 67 or younger, your body mass index should be between 19-25. Your Body mass index is 32.49 kg/m. If this is out of the aformentioned range listed, please consider follow up with your Primary Care Provider.   You will be due for a recall colonoscopy in 02-2018. We will send you a reminder in the mail when it gets closer to that time.  Thank you for choosing [facility]  Dr Wilfrid Lund III

## 2017-02-12 ENCOUNTER — Other Ambulatory Visit: Payer: Self-pay | Admitting: Endocrinology

## 2017-02-26 ENCOUNTER — Emergency Department (HOSPITAL_COMMUNITY)
Admission: EM | Admit: 2017-02-26 | Discharge: 2017-02-27 | Disposition: A | Discharge status: Home / Self Care | Attending: Emergency Medicine | Admitting: Emergency Medicine

## 2017-02-26 ENCOUNTER — Encounter (HOSPITAL_COMMUNITY): Payer: Self-pay

## 2017-02-26 ENCOUNTER — Emergency Department (HOSPITAL_COMMUNITY)

## 2017-02-26 DIAGNOSIS — R109 Unspecified abdominal pain: Secondary | ICD-10-CM | POA: Diagnosis present

## 2017-02-26 DIAGNOSIS — Z79899 Other long term (current) drug therapy: Secondary | ICD-10-CM | POA: Diagnosis not present

## 2017-02-26 DIAGNOSIS — N189 Chronic kidney disease, unspecified: Secondary | ICD-10-CM | POA: Diagnosis not present

## 2017-02-26 DIAGNOSIS — I129 Hypertensive chronic kidney disease with stage 1 through stage 4 chronic kidney disease, or unspecified chronic kidney disease: Secondary | ICD-10-CM | POA: Insufficient documentation

## 2017-02-26 DIAGNOSIS — Z7982 Long term (current) use of aspirin: Secondary | ICD-10-CM | POA: Diagnosis not present

## 2017-02-26 DIAGNOSIS — N23 Unspecified renal colic: Secondary | ICD-10-CM | POA: Diagnosis not present

## 2017-02-26 DIAGNOSIS — R1032 Left lower quadrant pain: Secondary | ICD-10-CM

## 2017-02-26 LAB — URINALYSIS, ROUTINE W REFLEX MICROSCOPIC
Bacteria, UA: NONE SEEN
Bilirubin Urine: NEGATIVE
Glucose, UA: NEGATIVE mg/dL
KETONES UR: NEGATIVE mg/dL
LEUKOCYTES UA: NEGATIVE
Nitrite: NEGATIVE
PH: 6 (ref 5.0–8.0)
Protein, ur: 30 mg/dL — AB
SQUAMOUS EPITHELIAL / LPF: NONE SEEN
Specific Gravity, Urine: 1.02 (ref 1.005–1.030)

## 2017-02-26 MED ORDER — HYDROMORPHONE HCL 1 MG/ML IJ SOLN
2.0000 mg | Freq: Once | INTRAMUSCULAR | Status: AC
  Administered 2017-02-26: 2 mg via INTRAMUSCULAR
  Filled 2017-02-26: qty 2

## 2017-02-26 MED ORDER — ONDANSETRON 4 MG PO TBDP
ORAL_TABLET | ORAL | Status: AC
  Filled 2017-02-26: qty 1

## 2017-02-26 MED ORDER — OXYCODONE-ACETAMINOPHEN 5-325 MG PO TABS
ORAL_TABLET | ORAL | Status: AC
  Filled 2017-02-26: qty 1

## 2017-02-26 MED ORDER — ONDANSETRON 4 MG PO TBDP
4.0000 mg | ORAL_TABLET | Freq: Once | ORAL | Status: AC
  Administered 2017-02-26: 4 mg via ORAL

## 2017-02-26 MED ORDER — OXYCODONE-ACETAMINOPHEN 5-325 MG PO TABS
1.0000 | ORAL_TABLET | ORAL | Status: DC | PRN
  Administered 2017-02-26: 1 via ORAL

## 2017-02-26 NOTE — ED Triage Notes (Signed)
Pt reports LEFT groin pain that started about 2 hours ago. PT states "I have never felt any thing like this" and is shaking from the pain in triage. PT denies any swelling to testicle that he noticed. Pain radiates up left abd.

## 2017-02-26 NOTE — ED Provider Notes (Signed)
[facility] DEPT Provider Note   CSN: [id number] Arrival date & time: 02/26/17  1953     History   Chief Complaint Chief Complaint  Patient presents with  . Groin Pain    HPI Hector [name] is a 65 y.o. male.  Patient presents with left-sided flank and abdominal pain onset around 6:30 PM. Pain is constant and progressively worsening. It radiates to his left lower abdomen and left testicle. Denies any difficulty with urination. Denies any hematuria. Reported left testicular pain and had an ultrasound performed in triage. Denies fever, chills, nausea or vomiting. Denies any change in bowel habits. Neverr had this pain before. He is now pain-free after receiving pain medication. Did have a kidney stone many years ago.    Groin Pain  Associated symptoms include abdominal pain. Pertinent negatives include no chest pain, no headaches and no shortness of breath.    Past Medical History:  Diagnosis Date  . Arthritis   . Chronic kidney disease   . Hyperlipidemia   . Hypertension   . Tuberculosis     Patient Active Problem List   Diagnosis Date Noted  . Low back pain 03/24/2016  . Hyperparathyroidism ([place]) 04/13/2015  . Hypercalcemia 04/09/2015  . Vitamin D deficiency 04/09/2015  . Osteoarthritis 11/06/2014  . Essential hypertension 11/06/2014  . Hyperlipidemia 11/06/2014  . GERD (gastroesophageal reflux disease) 11/06/2014  . Syncope 11/06/2014  . Routine general medical examination at a health care facility 11/06/2014    Past Surgical History:  Procedure Laterality Date  . PROSTATE SURGERY  12/2012   no cancer       Home Medications    Prior to Admission medications   Medication Sig Start Date End Date Taking? Authorizing Provider  amLODipine (NORVASC) 2.5 MG tablet Take 1 tablet (2.5 mg total) by mouth daily. 05/16/16   Renato Shin, MD  aspirin 81 MG tablet Take 81 mg by mouth daily.    Historical Provider, MD  cetirizine (ZYRTEC) 10 MG tablet Take 10 mg by  mouth daily.    Historical Provider, MD  diphenoxylate-atropine (LOMOTIL) 2.5-0.025 MG tablet Take 1 tablet by mouth 4 (four) times daily as needed for diarrhea or loose stools. Patient taking differently: Take 1 tablet by mouth as needed for diarrhea or loose stools.  12/19/16   Orpah Greek, MD  fluticasone [facility]) 50 MCG/ACT nasal spray USE 2 SPRAYS IN [facility] NOSTRIL DAILY 09/21/16   Hoyt Koch, MD  furosemide (LASIX) 20 MG tablet TAKE 1 TABLET DAILY 02/12/17   Renato Shin, MD  lisinopril (PRINIVIL,ZESTRIL) 40 MG tablet TAKE 1 TABLET DAILY 07/24/16   Renato Shin, MD  lovastatin (MEVACOR) 40 MG tablet Take 0.5 tablets (20 mg total) by mouth at bedtime. 11/10/14   Hoyt Koch, MD  meloxicam (MOBIC) 15 MG tablet Take 15 mg by mouth daily.    Historical Provider, MD  ondansetron (ZOFRAN) 4 MG tablet Take 1 tablet (4 mg total) by mouth every 6 (six) hours. Patient taking differently: Take 4 mg by mouth as needed.  12/19/16   Orpah Greek, MD  pantoprazole (PROTONIX) 40 MG tablet TAKE 1 TABLET DAILY 12/18/16   Hoyt Koch, MD  terazosin (HYTRIN) 10 MG capsule Take 10 mg by mouth at bedtime.    Historical Provider, MD  triamcinolone cream (KENALOG) 0.1 % Apply 1 application topically 2 (two) times daily. 06/12/16   Hoyt Koch, MD    Family History Family History  Problem Relation Age of Onset  .  Arthritis Mother   . Hyperlipidemia Mother   . Heart disease Mother   . Stomach cancer Mother   . Colon polyps Mother   . Arthritis Father   . Hyperlipidemia Father   . Heart disease Father   . Kidney disease Father   . Breast cancer Sister   . Hypertension Sister   . Ovarian cancer Sister   . Gallbladder disease Sister     x 6  . Colon cancer Neg Hx   . Liver cancer Neg Hx   . Rectal cancer Neg Hx   . Esophageal cancer Neg Hx     Social History Social History  Substance Use Topics  . Smoking status: Never Smoker  . Smokeless tobacco:  Never Used  . Alcohol use No     Allergies   Shellfish allergy   Review of Systems Review of Systems  Constitutional: Negative for activity change, appetite change and fever.  HENT: Negative for congestion.   Respiratory: Negative for cough, chest tightness and shortness of breath.   Cardiovascular: Negative for chest pain.  Gastrointestinal: Positive for abdominal pain. Negative for nausea and vomiting.  Genitourinary: Positive for flank pain, hematuria and testicular pain. Negative for difficulty urinating and dysuria.  Musculoskeletal: Positive for back pain. Negative for neck pain.  Neurological: Negative for dizziness, weakness, light-headedness and headaches.   A complete 10 system review of systems was obtained and all systems are negative except as noted in the HPI and PMH.    Physical Exam Updated Vital Signs BP 132/71 (BP Location: Left Arm)   Pulse 66   Temp 99 F (37.2 C) (Oral)   Resp (!) 28   Ht 5\' 9"  (1.753 m)   Wt 220 lb (99.8 kg)   SpO2 98%   BMI 32.49 kg/m   Physical Exam  Constitutional: He is oriented to person, place, and time. He appears well-developed and well-nourished. No distress.  HENT:  Head: Normocephalic and atraumatic.  Mouth/Throat: Oropharynx is clear and moist. No oropharyngeal exudate.  Eyes: Conjunctivae and EOM are normal. Pupils are equal, round, and reactive to light.  Neck: Normal range of motion. Neck supple.  No meningismus.  Cardiovascular: Normal rate, regular rhythm, normal heart sounds and intact distal pulses.   No murmur heard. Pulmonary/Chest: Effort normal and breath sounds normal. No respiratory distress.  Abdominal: Soft. There is no tenderness. There is no rebound and no guarding.  Soft, NTND  Genitourinary:  Genitourinary Comments: L paraspinal lumbar tenderness TTP L testicle superiorly  Musculoskeletal: Normal range of motion. He exhibits no edema or tenderness.  Neurological: He is alert and oriented to  person, place, and time. No cranial nerve deficit. He exhibits normal muscle tone. Coordination normal.  No ataxia on finger to nose bilaterally. No pronator drift. 5/5 strength throughout. CN 2-12 intact.Equal grip strength. Sensation intact.   Skin: Skin is warm.  Psychiatric: He has a normal mood and affect. His behavior is normal.  Nursing note and vitals reviewed.    ED Treatments / Results  Labs (all labs ordered are listed, but only abnormal results are displayed) Labs Reviewed  URINALYSIS, ROUTINE W REFLEX MICROSCOPIC - Abnormal; Notable for the following:       Result Value   APPearance HAZY (*)    Hgb urine dipstick LARGE (*)    Protein, ur 30 (*)    All other components within normal limits    EKG  EKG Interpretation None       Radiology US Scrotum  Result Date: 02/26/2017 CLINICAL DATA:  Acute onset of left groin pain.  Initial encounter. EXAM: SCROTAL ULTRASOUND DOPPLER ULTRASOUND OF THE TESTICLES TECHNIQUE: Complete ultrasound examination of the testicles, epididymis, and other scrotal structures was performed. Color and spectral Doppler ultrasound were also utilized to evaluate blood flow to the testicles. COMPARISON:  None. FINDINGS: Right testicle Measurements: 4.4 x 2.8 x 2.6 cm. Mild right-sided microlithiasis is noted. No mass seen. Left testicle Measurements: 3.7 x 2.3 x 2.5 cm. No mass or microlithiasis visualized. Right epididymis:  Normal in size and appearance. Left epididymis:  Normal in size and appearance. Hydrocele:  None visualized. Varicocele:  None visualized. Pulsed Doppler interrogation of both testes demonstrates normal low resistance arterial and venous waveforms bilaterally. IMPRESSION: 1. No evidence of testicular torsion. 2. Mild right-sided testicular microlithiasis noted. Electronically Signed   By: Garald Balding M.D.   On: 02/26/2017 22:47   [place] Art/ven Flow Abd Pelv Doppler  Result Date: 02/26/2017 CLINICAL DATA:  Acute onset of left groin  pain.  Initial encounter. EXAM: SCROTAL ULTRASOUND DOPPLER ULTRASOUND OF THE TESTICLES TECHNIQUE: Complete ultrasound examination of the testicles, epididymis, and other scrotal structures was performed. Color and spectral Doppler ultrasound were also utilized to evaluate blood flow to the testicles. COMPARISON:  None. FINDINGS: Right testicle Measurements: 4.4 x 2.8 x 2.6 cm. Mild right-sided microlithiasis is noted. No mass seen. Left testicle Measurements: 3.7 x 2.3 x 2.5 cm. No mass or microlithiasis visualized. Right epididymis:  Normal in size and appearance. Left epididymis:  Normal in size and appearance. Hydrocele:  None visualized. Varicocele:  None visualized. Pulsed Doppler interrogation of both testes demonstrates normal low resistance arterial and venous waveforms bilaterally. IMPRESSION: 1. No evidence of testicular torsion. 2. Mild right-sided testicular microlithiasis noted. Electronically Signed   By: Garald Balding M.D.   On: 02/26/2017 22:47   Ct Renal Stone Study  Result Date: 02/27/2017 CLINICAL DATA:  Left flank and left lower quadrant pain. Nausea. History kidney stones. EXAM: CT ABDOMEN AND PELVIS WITHOUT CONTRAST TECHNIQUE: Multidetector CT imaging of the abdomen and pelvis was performed following the standard protocol without IV contrast. COMPARISON:  None. FINDINGS: Lower chest: Dependent atelectasis at the lung bases. Small hiatal hernia. Trace pericardial fluid/thickening anteriorly. Hepatobiliary: Decreased hepatic density consistent with steatosis. Subcentimeter hypodense lesion with an anterior left lobe of the liver is too small to characterize. Gallbladder physiologically distended, no calcified stone. No biliary dilatation. Pancreas: No ductal dilatation or inflammation. Spleen: Normal in size without focal abnormality. Adrenals/Urinary Tract: Obstructing 3 x 3 mm stone in the left mid ureter (at the level of L4) with moderate proximal hydroureteronephrosis. Moderate  perinephric edema. Ureter distal to this is decompressed. No additional urolithiasis. No right hydronephrosis. Urinary bladder is minimally distended with minimal wall thickening. No bladder stone. No adrenal nodule. Stomach/Bowel: Small hiatal hernia. Stomach distended with ingested contents. No evidence of bowel wall thickening, distention or inflammation. Normal appendix. Diverticulosis of the distal descending and sigmoid colon without acute inflammation. Fecalization of distal small bowel contents suggest slow transit. Vascular/Lymphatic: Abdominal aorta is normal in caliber. No evidence of adenopathy. Phlebolith in the right pelvis. Reproductive: Prominent prostate gland spans 5.5 cm transverse. Other: Small fat containing umbilical hernia. No free air or ascites. Musculoskeletal: Degenerative change in the thoracolumbar spine with vacuum phenomena multiple levels. Scattered facet arthropathy. Peripherally sclerotic lesion in the right iliac bone has nonaggressive characteristics. IMPRESSION: 1. Obstructing 3 mm stone in the left mid ureter with moderate hydronephrosis. 2. Hepatic steatosis.  3. Incidental findings of enlarged prostate gland, colonic diverticulosis, small hiatal hernia, and small fat containing umbilical hernia. Electronically Signed   By: Jeb Levering M.D.   On: 02/27/2017 00:16    Procedures Procedures (including critical care time)  Medications Ordered in ED Medications  ondansetron (ZOFRAN-ODT) disintegrating tablet 4 mg (4 mg Oral Given 02/26/17 2036)  HYDROmorphone (DILAUDID) injection 2 mg (2 mg Intramuscular Given 02/26/17 2148)     Initial Impression / Assessment and Plan / ED Course  I have reviewed the triage vital signs and the nursing notes.  Pertinent labs & imaging results that were available during my care of the patient were reviewed by me and considered in my medical decision making (see chart for details).     Acute onset of left-sided flank pain and  radiation to the left testicle. Urinalysis with blood without infection. Ultrasound performed in triage is negative for torsion. Clinically exam is negative for torsion as well.  Urinalysis shows hematuria without infection. Pain controlled in the ED with previous medications given. CT scan obtained and shows left ureteral stone with mild hydronephrosis. UA negative for infection.   Patient's pain nausea well controlled in the ED. No vomiting. We'll treat with anti-inflammatories and pain control. Follow-up urology. Return precautions discussed.   Final Clinical Impressions(s) / ED Diagnoses   Final diagnoses:  Ureteral colic    New Prescriptions New Prescriptions   No medications on file     Ezequiel Essex, MD 02/27/17 [phone]

## 2017-02-27 MED ORDER — OXYCODONE-ACETAMINOPHEN 5-325 MG PO TABS: 1.0000 | ORAL_TABLET | ORAL | 0 refills | Status: DC | PRN

## 2017-02-27 MED ORDER — IBUPROFEN 800 MG PO TABS: 800.0000 mg | ORAL_TABLET | Freq: Three times a day (TID) | ORAL | 0 refills | Status: DC

## 2017-02-27 MED ORDER — ONDANSETRON HCL 4 MG PO TABS: 4.0000 mg | ORAL_TABLET | Freq: Three times a day (TID) | ORAL | 0 refills | Status: DC | PRN

## 2017-02-27 MED ORDER — TAMSULOSIN HCL 0.4 MG PO CAPS: 0.4000 mg | ORAL_CAPSULE | Freq: Every day | ORAL | 0 refills | Status: DC

## 2017-02-27 NOTE — Discharge Instructions (Signed)
Take the pain medication as prescribed. Follow-up with the urologist. Return to the ED if you develop worsening pain, vomiting, fever or any other concerns.

## 2017-03-01 ENCOUNTER — Ambulatory Visit: Admitting: Internal Medicine

## 2017-03-05 ENCOUNTER — Ambulatory Visit: Admitting: Internal Medicine

## 2017-03-27 ENCOUNTER — Encounter: Payer: Self-pay | Admitting: Internal Medicine

## 2017-03-30 ENCOUNTER — Other Ambulatory Visit (INDEPENDENT_AMBULATORY_CARE_PROVIDER_SITE_OTHER)

## 2017-03-30 ENCOUNTER — Encounter: Payer: Self-pay | Admitting: Internal Medicine

## 2017-03-30 ENCOUNTER — Ambulatory Visit (INDEPENDENT_AMBULATORY_CARE_PROVIDER_SITE_OTHER): Admitting: Internal Medicine

## 2017-03-30 VITALS — BP 146/90 | HR 63 | Temp 97.8°F | Resp 12 | Ht 69.0 in | Wt 221.0 lb

## 2017-03-30 DIAGNOSIS — R300 Dysuria: Secondary | ICD-10-CM

## 2017-03-30 LAB — URINALYSIS, ROUTINE W REFLEX MICROSCOPIC
Bilirubin Urine: NEGATIVE
KETONES UR: NEGATIVE
Leukocytes, UA: NEGATIVE
Nitrite: NEGATIVE
PH: 6.5 (ref 5.0–8.0)
Specific Gravity, Urine: 1.02 (ref 1.000–1.030)
Total Protein, Urine: NEGATIVE
URINE GLUCOSE: NEGATIVE
UROBILINOGEN UA: 0.2 (ref 0.0–1.0)

## 2017-03-30 NOTE — Assessment & Plan Note (Signed)
Possibly from kidney stone which may or may not have passed. Checking U/A for signs of infection and if present will treat.

## 2017-03-30 NOTE — Progress Notes (Signed)
Pre visit review using our clinic review tool, if applicable. No additional management support is needed unless otherwise documented below in the visit note. 

## 2017-03-30 NOTE — Progress Notes (Signed)
   Subjective:    Patient ID: Hector [name], male    DOB: 25-Oct-1951, 66 y.o.   MRN: [id number]  HPI The patient is a 66 YO man coming in for flank pain and urinary symptoms. They started over the weekend. He has recently had a kidney stone about 1 month ago and the symptoms felt similar. He used some pain medicine he had left over and increased his fluids. He was not straining the urine but passed some blood on Monday and is not sure if he passed a stone. He had some mild burning with urination over the weekend which is now gone. The back pain is also now gone today. He denies fevers or chills. Denies nausea or vomiting. Able to eat and drink well and not needing any pain meds at this time. No new partners.  Review of Systems  Constitutional: Negative for activity change, appetite change, fatigue, fever and unexpected weight change.  Respiratory: Negative.   Cardiovascular: Negative.   Gastrointestinal: Negative for abdominal distention, abdominal pain, constipation, diarrhea, nausea and vomiting.  Genitourinary: Positive for dysuria, flank pain and hematuria. Negative for decreased urine volume, discharge, frequency, penile pain, testicular pain and urgency.  Skin: Negative.       Objective:   Physical Exam  Constitutional: He is oriented to person, place, and time. He appears well-developed and well-nourished. No distress.  HENT:  Head: Normocephalic and atraumatic.  Eyes: EOM are normal.  Cardiovascular: Normal rate.   Pulmonary/Chest: Effort normal.  Abdominal: Soft. Bowel sounds are normal. He exhibits no distension. There is no tenderness. There is no rebound.  Neurological: He is alert and oriented to person, place, and time.  Skin: Skin is warm and dry.   Vitals:   03/30/17 0825  BP: (!) 146/90  Pulse: 63  Resp: 12  Temp: 97.8 F (36.6 C)  TempSrc: Oral  SpO2: 100%  Weight: 221 lb (100.2 kg)  Height: 5\' 9"  (1.753 m)      Assessment & Plan:

## 2017-03-30 NOTE — Patient Instructions (Signed)
We will check the urine today for infection. It may have been a kidney stone which has passed.   Keep up the good work with drinking plenty of fluids.

## 2017-04-27 ENCOUNTER — Other Ambulatory Visit: Payer: Self-pay | Admitting: Internal Medicine

## 2017-05-04 ENCOUNTER — Telehealth: Admitting: Family

## 2017-05-04 DIAGNOSIS — R6889 Other general symptoms and signs: Secondary | ICD-10-CM

## 2017-05-04 MED ORDER — OSELTAMIVIR PHOSPHATE 75 MG PO CAPS: 75.0000 mg | ORAL_CAPSULE | Freq: Two times a day (BID) | ORAL | 0 refills | Status: DC

## 2017-05-04 MED ORDER — BENZONATATE 100 MG PO CAPS: 100.0000 mg | ORAL_CAPSULE | Freq: Three times a day (TID) | ORAL | 0 refills | Status: DC | PRN

## 2017-05-04 NOTE — Progress Notes (Signed)
Thank you for the details you put in the comment boxes. Those details really help [place] take better care of you. The phone call was also helpful.   E visit for Flu like symptoms   We are sorry that you are not feeling well.  Here is how we plan to help! Based on what you have shared with me it looks like you may have a respiratory virus that may be influenza.  Influenza or "the flu" is   an infection caused by a respiratory virus. The flu virus is highly contagious and persons who did not receive their yearly flu vaccination may "catch" the flu from close contact.  We have anti-viral medications to treat the viruses that cause this infection. They are not a "cure" and only shorten the course of the infection. These prescriptions are most effective when they are given within the first 2 days of "flu" symptoms. Antiviral medication are indicated if you have a high risk of complications from the flu. You should  also consider an antiviral medication if you are in close contact with someone who is at risk. These medications can help patients avoid complications from the flu  but have side effects that you should know. Possible side effects from Tamiflu or oseltamivir include nausea, vomiting, diarrhea, dizziness, headaches, eye redness, sleep problems or other respiratory symptoms. You should not take Tamiflu if you have an allergy to oseltamivir or any to the ingredients in Tamiflu.  Based upon your symptoms and potential risk factors I have prescribed Oseltamivir (Tamiflu).  It has been sent to your designated pharmacy.  You will take one 75 mg capsule orally twice a day for the next 5 days.  I'm also adding Tessalon 100mg , take 1-2 capsules every 8 hours as needed for cough.   ANYONE WHO HAS FLU SYMPTOMS SHOULD: . Stay home. The flu is highly contagious and going out or to work exposes others! . Be sure to drink plenty of fluids. Water is fine as well as fruit juices, sodas and electrolyte beverages.  You may want to stay away from caffeine or alcohol. If you are nauseated, try taking small sips of liquids. How do you know if you are getting enough fluid? Your urine should be a pale yellow or almost colorless. . Get rest. . Taking a steamy shower or using a humidifier may help nasal congestion and ease sore throat pain. Using a saline nasal spray works much the same way. . Cough drops, hard candies and sore throat lozenges may ease your cough. . Line up a caregiver. Have someone check on you regularly.   GET HELP RIGHT AWAY IF: . You cannot keep down liquids or your medications. . You become short of breath . Your fell like you are going to pass out or loose consciousness. . Your symptoms persist after you have completed your treatment plan MAKE SURE YOU   Understand these instructions.  Will watch your condition.  Will get help right away if you are not doing well or get worse.  Your e-visit answers were reviewed by a board certified advanced clinical practitioner to complete your personal care plan.  Depending on the condition, your plan could have included both over the counter or prescription medications.  If there is a problem please reply  once you have received a response from your provider.  Your safety is important to [place].  If you have drug allergies check your prescription carefully.    You can use MyChart to ask  questions about today's visit, request a non-urgent call back, or ask for a work or school excuse for 24 hours related to this e-Visit. If it has been greater than 24 hours you will need to follow up with your provider, or enter a new e-Visit to address those concerns.  You will get an e-mail in the next two days asking about your experience.  I hope that your e-visit has been valuable and will speed your recovery. Thank you for using e-visits.

## 2017-05-07 ENCOUNTER — Encounter: Payer: Self-pay | Admitting: Internal Medicine

## 2017-05-07 MED ORDER — DICYCLOMINE HCL 10 MG PO CAPS: 10.0000 mg | ORAL_CAPSULE | Freq: Three times a day (TID) | ORAL | 0 refills | Status: DC

## 2017-06-12 ENCOUNTER — Encounter: Admitting: Internal Medicine

## 2017-06-12 ENCOUNTER — Encounter: Admitting: Nurse Practitioner

## 2017-06-12 ENCOUNTER — Other Ambulatory Visit: Payer: Self-pay | Admitting: Internal Medicine

## 2017-06-25 ENCOUNTER — Other Ambulatory Visit: Payer: Self-pay | Admitting: Internal Medicine

## 2017-06-25 ENCOUNTER — Other Ambulatory Visit: Payer: Self-pay | Admitting: Endocrinology

## 2017-08-02 ENCOUNTER — Other Ambulatory Visit: Payer: Self-pay | Admitting: Family

## 2017-08-16 ENCOUNTER — Ambulatory Visit: Admitting: Endocrinology

## 2017-08-19 ENCOUNTER — Other Ambulatory Visit: Payer: Self-pay | Admitting: Endocrinology

## 2017-08-27 ENCOUNTER — Ambulatory Visit (INDEPENDENT_AMBULATORY_CARE_PROVIDER_SITE_OTHER): Admitting: Endocrinology

## 2017-08-27 ENCOUNTER — Encounter: Payer: Self-pay | Admitting: Endocrinology

## 2017-08-27 VITALS — BP 132/90 | HR 64 | Wt 216.8 lb

## 2017-08-27 DIAGNOSIS — E213 Hyperparathyroidism, unspecified: Secondary | ICD-10-CM | POA: Diagnosis not present

## 2017-08-27 LAB — VITAMIN D 25 HYDROXY (VIT D DEFICIENCY, FRACTURES): VITD: 32.32 ng/mL (ref 30.00–100.00)

## 2017-08-27 NOTE — Progress Notes (Signed)
Subjective:    Patient ID: Hector [name], male    DOB: 05-24-1951, 66 y.o.   MRN: [id number]  HPI The state of at least three ongoing medical problems is addressed today, with interval history of each noted here:  Primary hyperparathyroidism (dx'ed 2013; HCTZ was changed to lasix; DEXA was normal in 2016 and 2018).  He had episode of urolithiasis 6 mos ago.  No change in chronic arthralgias.  This is a stable problem. Vit-D deficiency (he has had fx left little finger, coccyx, and left great toe--all with significant injuries): he denies cramps.  He takes vit-D, 2000 units/d. This is a stable problem. HTN: He gets good diuretic effect from lasix.  This is a stable problem. Hypokalemia: denies edema.  This is a stable problem. Past Medical History:  Diagnosis Date  . Arthritis   . Chronic kidney disease   . Hyperlipidemia   . Hypertension   . Tuberculosis     Past Surgical History:  Procedure Laterality Date  . PROSTATE SURGERY  12/2012   no cancer    Social History   Social History  . Marital status: Married    Spouse name: N/A  . Number of children: 2  . Years of education: N/A   Occupational History  . retired    Social History Main Topics  . Smoking status: Never Smoker  . Smokeless tobacco: Never Used  . Alcohol use No  . Drug use: No  . Sexual activity: Not on file   Other Topics Concern  . Not on file   Social History Narrative  . No narrative on file    Current Outpatient Prescriptions on File Prior to Visit  Medication Sig Dispense Refill  . amLODipine (NORVASC) 2.5 MG tablet Take 1 tablet (2.5 mg total) by mouth daily. 90 tablet 3  . aspirin 81 MG tablet Take 81 mg by mouth daily.    . cetirizine (ZYRTEC) 10 MG tablet Take 10 mg by mouth daily.    . fluticasone (FLONASE) 50 MCG/ACT nasal spray USE 2 SPRAYS IN EACH NOSTRIL DAILY 16 g 0  . furosemide (LASIX) 20 MG tablet TAKE 1 TABLET DAILY 90 tablet 0  . ibuprofen (ADVIL,MOTRIN) 800 MG tablet Take 1  tablet (800 mg total) by mouth 3 (three) times daily. 21 tablet 0  . lisinopril (PRINIVIL,ZESTRIL) 40 MG tablet TAKE 1 TABLET DAILY 90 tablet 3  . lovastatin (MEVACOR) 40 MG tablet Take 0.5 tablets (20 mg total) by mouth at bedtime. 45 tablet 3  . meloxicam (MOBIC) 15 MG tablet Take 15 mg by mouth daily.    . pantoprazole (PROTONIX) 40 MG tablet TAKE 1 TABLET DAILY 90 tablet 1  . tamsulosin (FLOMAX) 0.4 MG CAPS capsule Take 1 capsule (0.4 mg total) by mouth daily. 10 capsule 0  . terazosin (HYTRIN) 10 MG capsule Take 10 mg by mouth at bedtime.    . triamcinolone cream (KENALOG) 0.1 % Apply 1 application topically 2 (two) times daily. 80 g 6  . benzonatate (TESSALON PERLES) 100 MG capsule Take 1-2 capsules (100-200 mg total) by mouth every 8 (eight) hours as needed for cough. (Patient not taking: Reported on 08/27/2017) 30 capsule 0  . dicyclomine (BENTYL) 10 MG capsule Take 1 capsule (10 mg total) by mouth 4 (four) times daily -  before meals and at bedtime. (Patient not taking: Reported on 08/27/2017) 30 capsule 0  . ondansetron (ZOFRAN) 4 MG tablet Take 1 tablet (4 mg total) by mouth every 8 (eight)  hours as needed for nausea or vomiting. (Patient not taking: Reported on 08/27/2017) 12 tablet 0  . oseltamivir (TAMIFLU) 75 MG capsule Take 1 capsule (75 mg total) by mouth 2 (two) times daily. (Patient not taking: Reported on 08/27/2017) 10 capsule 0   No current facility-administered medications on file prior to visit.     Allergies  Allergen Reactions  . Shellfish Allergy Hives    Family History  Problem Relation Age of Onset  . Arthritis Mother   . Hyperlipidemia Mother   . Heart disease Mother   . Stomach cancer Mother   . Colon polyps Mother   . Arthritis Father   . Hyperlipidemia Father   . Heart disease Father   . Kidney disease Father   . Breast cancer Sister   . Hypertension Sister   . Ovarian cancer Sister   . Gallbladder disease Sister        x 6  . Colon cancer Neg Hx   .  Liver cancer Neg Hx   . Rectal cancer Neg Hx   . Esophageal cancer Neg Hx     BP 132/90   Pulse 64   Wt 216 lb 12.8 oz (98.3 kg)   SpO2 98%   BMI 32.02 kg/m    Review of Systems Denies numbness and falls.     Objective:   Physical Exam VITAL SIGNS:  See vs page GENERAL: no distress.  Gait: normal and steady.  outside test results are reviewed: DEXA is normal  outside test results are reviewed: BMET is normal     Assessment & Plan:  HTN: well-controlled Vit-D deficiency: due for recheck Hyperparathyroidism: mild. Due for recheck  Patient Instructions  blood tests are requested for you today.  We'll let you know about the results. Please continue the same medications. Please come back for a follow-up appointment in 1 year.

## 2017-08-27 NOTE — Patient Instructions (Addendum)
blood tests are requested for you today.  We'll let you know about the results. Please continue the same medications Please come back for a follow-up appointment in 1 year.  

## 2017-08-29 LAB — PTH, INTACT AND CALCIUM
Calcium: 10.2 mg/dL (ref 8.6–10.3)
PTH: 124 pg/mL — AB (ref 14–64)

## 2017-09-13 ENCOUNTER — Telehealth: Payer: Self-pay

## 2017-09-13 ENCOUNTER — Ambulatory Visit (INDEPENDENT_AMBULATORY_CARE_PROVIDER_SITE_OTHER): Admitting: Internal Medicine

## 2017-09-13 ENCOUNTER — Encounter: Payer: Self-pay | Admitting: Internal Medicine

## 2017-09-13 ENCOUNTER — Other Ambulatory Visit (INDEPENDENT_AMBULATORY_CARE_PROVIDER_SITE_OTHER)

## 2017-09-13 VITALS — BP 122/88 | HR 57 | Temp 98.4°F | Ht 69.0 in | Wt 214.0 lb

## 2017-09-13 DIAGNOSIS — Z0000 Encounter for general adult medical examination without abnormal findings: Secondary | ICD-10-CM

## 2017-09-13 DIAGNOSIS — Z23 Encounter for immunization: Secondary | ICD-10-CM

## 2017-09-13 DIAGNOSIS — I10 Essential (primary) hypertension: Secondary | ICD-10-CM

## 2017-09-13 DIAGNOSIS — E785 Hyperlipidemia, unspecified: Secondary | ICD-10-CM | POA: Diagnosis not present

## 2017-09-13 LAB — CBC
HEMATOCRIT: 44.1 % (ref 39.0–52.0)
HEMOGLOBIN: 14.5 g/dL (ref 13.0–17.0)
MCHC: 32.9 g/dL (ref 30.0–36.0)
MCV: 87.1 fl (ref 78.0–100.0)
PLATELETS: 164 10*3/uL (ref 150.0–400.0)
RBC: 5.06 Mil/uL (ref 4.22–5.81)
RDW: 15 % (ref 11.5–15.5)
WBC: 2.6 10*3/uL — ABNORMAL LOW (ref 4.0–10.5)

## 2017-09-13 LAB — HEMOGLOBIN A1C: Hgb A1c MFr Bld: 5.7 % (ref 4.6–6.5)

## 2017-09-13 LAB — LIPID PANEL
CHOL/HDL RATIO: 3
Cholesterol: 146 mg/dL (ref 0–200)
HDL: 49.4 mg/dL (ref >39.00)
LDL CALC: 84 mg/dL (ref 0–99)
NONHDL: 96.1
TRIGLYCERIDES: 62 mg/dL (ref 0.0–149.0)
VLDL: 12.4 mg/dL (ref 0.0–40.0)

## 2017-09-13 LAB — COMPREHENSIVE METABOLIC PANEL
ALT: 22 U/L (ref 0–53)
AST: 20 U/L (ref 0–37)
Albumin: 4.1 g/dL (ref 3.5–5.2)
Alkaline Phosphatase: 73 U/L (ref 39–117)
BILIRUBIN TOTAL: 0.7 mg/dL (ref 0.2–1.2)
BUN: 15 mg/dL (ref 6–23)
CALCIUM: 9.7 mg/dL (ref 8.4–10.5)
CHLORIDE: 106 meq/L (ref 96–112)
CO2: 28 meq/L (ref 19–32)
Creatinine, Ser: 0.98 mg/dL (ref 0.40–1.50)
GFR: 98.81 mL/min (ref >60.00)
GLUCOSE: 100 mg/dL — AB (ref 70–99)
POTASSIUM: 3.4 meq/L — AB (ref 3.5–5.1)
Sodium: 141 mEq/L (ref 135–145)
Total Protein: 6.8 g/dL (ref 6.0–8.3)

## 2017-09-13 MED ORDER — TRIAMCINOLONE ACETONIDE 0.1 % EX CREA: 1.0000 "application " | TOPICAL_CREAM | Freq: Two times a day (BID) | CUTANEOUS | 6 refills | Status: DC

## 2017-09-13 MED ORDER — LOSARTAN POTASSIUM 100 MG PO TABS: 100.0000 mg | ORAL_TABLET | Freq: Every day | ORAL | 3 refills | Status: DC

## 2017-09-13 NOTE — Assessment & Plan Note (Signed)
Colonoscopy up to date, flu shot given at visit. Counseled about shingrix. Tetanus up to date. Counseled about sun safety and mole surveillance. Given screening recommendations.

## 2017-09-13 NOTE — Progress Notes (Signed)
   Subjective:    Patient ID: Hector [name], male    DOB: 10/07/1951, 66 y.o.   MRN: [id number]  HPI The patient is a 66 YO man coming in for physical. No new concerns.   PMH, [facility], social history reviewed and updated.   Review of Systems  Constitutional: Negative.   HENT: Negative.   Eyes: Negative.   Respiratory: Negative for cough, chest tightness and shortness of breath.   Cardiovascular: Negative for chest pain, palpitations and leg swelling.  Gastrointestinal: Negative for abdominal distention, abdominal pain, constipation, diarrhea, nausea and vomiting.  Musculoskeletal: Positive for arthralgias.  Skin: Negative.   Neurological: Negative.   Psychiatric/Behavioral: Negative.       Objective:   Physical Exam  Constitutional: He is oriented to person, place, and time. He appears well-developed and well-nourished.  HENT:  Head: Normocephalic and atraumatic.  Eyes: EOM are normal.  Neck: Normal range of motion.  Cardiovascular: Normal rate and regular rhythm.   Pulmonary/Chest: Effort normal and breath sounds normal. No respiratory distress. He has no wheezes. He has no rales.  Abdominal: Soft. Bowel sounds are normal. He exhibits no distension. There is no tenderness. There is no rebound.  Musculoskeletal: He exhibits no edema.  Neurological: He is alert and oriented to person, place, and time. Coordination normal.  Skin: Skin is warm and dry.  Psychiatric: He has a normal mood and affect.   Vitals:   09/13/17 0902  BP: 122/88  Pulse: (!) 57  Temp: 98.4 F (36.9 C)  TempSrc: Oral  SpO2: 100%  Weight: 214 lb (97.1 kg)  Height: 5\' 9"  (1.753 m)      Assessment & Plan:  Flu shot given at visit

## 2017-09-13 NOTE — Patient Instructions (Signed)
We will call when your shingles vaccine is in.   We are checking the labs today.    Health Maintenance, Male A healthy lifestyle and preventive care is important for your health and wellness. Ask your health care provider about what schedule of regular examinations is right for you. What should I know about weight and diet? Eat a Healthy Diet  Eat plenty of vegetables, fruits, whole grains, low-fat dairy products, and lean protein.  Do not eat a lot of foods high in solid fats, added sugars, or salt.  Maintain a Healthy Weight Regular exercise can help you achieve or maintain a healthy weight. You should:  Do at least 150 minutes of exercise each week. The exercise should increase your heart rate and make you sweat (moderate-intensity exercise).  Do strength-training exercises at least twice a week.  Watch Your Levels of Cholesterol and Blood Lipids  Have your blood tested for lipids and cholesterol every 5 years starting at 64 years of age. If you are at high risk for heart disease, you should start having your blood tested when you are 64 years old. You may need to have your cholesterol levels checked more often if: ? Your lipid or cholesterol levels are high. ? You are older than 64 years of age. ? You are at high risk for heart disease.  What should I know about cancer screening? Many types of cancers can be detected early and may often be prevented. Lung Cancer  You should be screened every year for lung cancer if: ? You are a current smoker who has smoked for at least 30 years. ? You are a former smoker who has quit within the past 15 years.  Talk to your health care provider about your screening options, when you should start screening, and how often you should be screened.  Colorectal Cancer  Routine colorectal cancer screening usually begins at 64 years of age and should be repeated every 5-10 years until you are 64 years old. You may need to be screened more often if  early forms of precancerous polyps or small growths are found. Your health care provider may recommend screening at an earlier age if you have risk factors for colon cancer.  Your health care provider may recommend using home test kits to check for hidden blood in the stool.  A small camera at the end of a tube can be used to examine your colon (sigmoidoscopy or colonoscopy). This checks for the earliest forms of colorectal cancer.  Prostate and Testicular Cancer  Depending on your age and overall health, your health care provider may do certain tests to screen for prostate and testicular cancer.  Talk to your health care provider about any symptoms or concerns you have about testicular or prostate cancer.  Skin Cancer  Check your skin from head to toe regularly.  Tell your health care provider about any new moles or changes in moles, especially if: ? There is a change in a mole's size, shape, or color. ? You have a mole that is larger than a pencil eraser.  Always use sunscreen. Apply sunscreen liberally and repeat throughout the day.  Protect yourself by wearing long sleeves, pants, a wide-brimmed hat, and sunglasses when outside.  What should I know about heart disease, diabetes, and high blood pressure?  If you are 51-93 years of age, have your blood pressure checked every 3-5 years. If you are 25 years of age or older, have your blood pressure checked every  year. You should have your blood pressure measured twice-once when you are at a hospital or clinic, and once when you are not at a hospital or clinic. Record the average of the two measurements. To check your blood pressure when you are not at a hospital or clinic, you can use: ? An automated blood pressure machine at a pharmacy. ? A home blood pressure monitor.  Talk to your health care provider about your target blood pressure.  If you are between 58-12 years old, ask your health care provider if you should take aspirin to  prevent heart disease.  Have regular diabetes screenings by checking your fasting blood sugar level. ? If you are at a normal weight and have a low risk for diabetes, have this test once every three years after the age of 25. ? If you are overweight and have a high risk for diabetes, consider being tested at a younger age or more often.  A one-time screening for abdominal aortic aneurysm (AAA) by ultrasound is recommended for men aged 42-75 years who are current or former smokers. What should I know about preventing infection? Hepatitis B If you have a higher risk for hepatitis B, you should be screened for this virus. Talk with your health care provider to find out if you are at risk for hepatitis B infection. Hepatitis C Blood testing is recommended for:  Everyone born from [birth year] through 1963.  Anyone with known risk factors for hepatitis C.  Sexually Transmitted Diseases (STDs)  You should be screened each year for STDs including gonorrhea and chlamydia if: ? You are sexually active and are younger than 64 years of age. ? You are older than 64 years of age and your health care provider tells you that you are at risk for this type of infection. ? Your sexual activity has changed since you were last screened and you are at an increased risk for chlamydia or gonorrhea. Ask your health care provider if you are at risk.  Talk with your health care provider about whether you are at high risk of being infected with HIV. Your health care provider may recommend a prescription medicine to help prevent HIV infection.  What else can I do?  Schedule regular health, dental, and eye exams.  Stay current with your vaccines (immunizations).  Do not use any tobacco products, such as cigarettes, chewing tobacco, and e-cigarettes. If you need help quitting, ask your health care provider.  Limit alcohol intake to no more than 2 drinks per day. One drink equals 12 ounces of beer, 5 ounces of wine, or  1 ounces of hard liquor.  Do not use street drugs.  Do not share needles.  Ask your health care provider for help if you need support or information about quitting drugs.  Tell your health care provider if you often feel depressed.  Tell your health care provider if you have ever been abused or do not feel safe at home. This information is not intended to replace advice given to you by your health care provider. Make sure you discuss any questions you have with your health care provider. Document Released: 05/18/2008 Document Revised: 07/19/2016 Document Reviewed: 08/24/2015 Elsevier Interactive Patient Education  [employer].

## 2017-09-13 NOTE — Assessment & Plan Note (Signed)
Change lisinopril to losartan due to dry cough. Continue amlodipine and lasix. Checking CMP and adjust as needed.

## 2017-09-13 NOTE — Telephone Encounter (Signed)
Per dr Sharlet Salina, patient has been added to shingrix waitlist

## 2017-09-13 NOTE — Assessment & Plan Note (Signed)
Checking lipid panel and adjust lovastatin as needed.

## 2017-09-27 ENCOUNTER — Telehealth: Payer: Self-pay

## 2017-09-27 NOTE — Telephone Encounter (Signed)
Left message asking patient to call back to schedule nurse visit for first injection shingrix---let tamara know when scheduled so that both vaccines can be labeled and placed in Hector [name]'s refrig

## 2017-10-01 NOTE — Telephone Encounter (Signed)
Both vaccines have been labeled and placed in refrig 

## 2017-10-02 ENCOUNTER — Ambulatory Visit (INDEPENDENT_AMBULATORY_CARE_PROVIDER_SITE_OTHER): Admitting: General Practice

## 2017-10-02 DIAGNOSIS — Z23 Encounter for immunization: Secondary | ICD-10-CM | POA: Diagnosis not present

## 2017-10-04 ENCOUNTER — Encounter: Payer: Self-pay | Admitting: Internal Medicine

## 2017-10-05 ENCOUNTER — Other Ambulatory Visit: Payer: Self-pay | Admitting: *Deleted

## 2017-10-05 MED ORDER — FLUTICASONE PROPIONATE 50 MCG/ACT NA SUSP: 2.0000 | Freq: Every day | NASAL | 3 refills | Status: DC

## 2017-10-10 ENCOUNTER — Encounter: Payer: Self-pay | Admitting: Internal Medicine

## 2017-10-11 MED ORDER — BENZONATATE 200 MG PO CAPS: 200.0000 mg | ORAL_CAPSULE | Freq: Three times a day (TID) | ORAL | 0 refills | Status: DC | PRN

## 2017-11-02 ENCOUNTER — Other Ambulatory Visit: Payer: Self-pay | Admitting: Family

## 2017-12-04 HISTORY — PX: ROTATOR CUFF REPAIR: SHX139

## 2017-12-10 ENCOUNTER — Ambulatory Visit: Payer: Self-pay

## 2017-12-10 NOTE — Telephone Encounter (Signed)
Patient called in with c/o "right lower abdominal pain." He reports the pain started "Saturday around 2 pm after I drank ice water. The pain was sharp to my right lower abdomen near the groin. I took Tylenol and laid down, and the pain went away. Today, I drank water and the pain came back, but it's not there now. It's just a dull ache, 1-2 ." He denies fever, abdominal cramping, diarrhea, nausea, vomiting. He denies injury to the area, states "It feels like it did when I had a kidney stone in March last year. That's why I called to see the doctor." According to the protocol, see PCP within 24 hours, appointment made for 12/11/17, care advice given, verbalized understanding.  Reason for Disposition . [1] MILD pain (e.g., does not interfere with normal activities) AND [2] pain comes and goes (cramps) [3] present > 48 hours  Answer Assessment - Initial Assessment Questions 1. LOCATION: "Where does it hurt?"      Right lower abdomen near groin brought on by drinking cold, ice water 2. RADIATION: "Does the pain shoot anywhere else?" (e.g., chest, back)     Pain shot into back and groin area on Saturday 3. ONSET: "When did the pain begin?" (Minutes, hours or days ago)      Saturday around 2pm  after drinking ice water 4. SUDDEN: "Gradual or sudden onset?"     Sudden, lasted 1 hour 5. PATTERN "Does the pain come and go, or is it constant?"    - If constant: "Is it getting better, staying the same, or worsening?"      (Note: Constant means the pain never goes away completely; most serious pain is constant and it progresses)     - If intermittent: "How long does it last?" "Do you have pain now?"     (Note: Intermittent means the pain goes away completely between bouts)     Coming and going 6. SEVERITY: "How bad is the pain?"  (e.g., Scale 1-10; mild, moderate, or severe)    - MILD (1-3): doesn't interfere with normal activities, abdomen soft and not tender to touch     - MODERATE (4-7): interferes with  normal activities or awakens from sleep, tender to touch     - SEVERE (8-10): excruciating pain, doubled over, unable to do any normal activities       Mild-dull, 1-2 7. RECURRENT SYMPTOM: "Have you ever had this type of abdominal pain before?" If so, ask: "When was the last time?" and "What happened that time?"      Similar to when I had a kidney stone last March 8. CAUSE: "What do you think is causing the abdominal pain?"     Unsure 9. RELIEVING/AGGRAVATING FACTORS: "What makes it better or worse?" (e.g., movement, antacids, bowel movement)     Tylenol eases the pain and movement made it better 10. OTHER SYMPTOMS: "Has there been any vomiting, diarrhea, constipation, or urine problems?"       Denies  Protocols used: ABDOMINAL PAIN - MALE-A-AH

## 2017-12-11 ENCOUNTER — Ambulatory Visit (INDEPENDENT_AMBULATORY_CARE_PROVIDER_SITE_OTHER): Payer: Medicare Other | Admitting: Internal Medicine

## 2017-12-11 ENCOUNTER — Ambulatory Visit (INDEPENDENT_AMBULATORY_CARE_PROVIDER_SITE_OTHER): Payer: Medicare Other | Admitting: *Deleted

## 2017-12-11 ENCOUNTER — Other Ambulatory Visit (INDEPENDENT_AMBULATORY_CARE_PROVIDER_SITE_OTHER): Payer: Medicare Other

## 2017-12-11 ENCOUNTER — Encounter: Payer: Self-pay | Admitting: Internal Medicine

## 2017-12-11 VITALS — BP 128/90 | HR 58 | Temp 98.2°F | Ht 69.0 in | Wt 221.0 lb

## 2017-12-11 DIAGNOSIS — R1031 Right lower quadrant pain: Secondary | ICD-10-CM

## 2017-12-11 DIAGNOSIS — Z23 Encounter for immunization: Secondary | ICD-10-CM

## 2017-12-11 LAB — COMPREHENSIVE METABOLIC PANEL
ALT: 22 U/L (ref 0–53)
AST: 19 U/L (ref 0–37)
Albumin: 4 g/dL (ref 3.5–5.2)
Alkaline Phosphatase: 72 U/L (ref 39–117)
BILIRUBIN TOTAL: 0.5 mg/dL (ref 0.2–1.2)
BUN: 22 mg/dL (ref 6–23)
CALCIUM: 10.2 mg/dL (ref 8.4–10.5)
CHLORIDE: 108 meq/L (ref 96–112)
CO2: 27 meq/L (ref 19–32)
CREATININE: 0.97 mg/dL (ref 0.40–1.50)
GFR: 99.91 mL/min (ref >60.00)
GLUCOSE: 106 mg/dL — AB (ref 70–99)
Potassium: 3.6 mEq/L (ref 3.5–5.1)
SODIUM: 139 meq/L (ref 135–145)
Total Protein: 6.6 g/dL (ref 6.0–8.3)

## 2017-12-11 LAB — LIPASE: LIPASE: 11 U/L (ref 11.0–59.0)

## 2017-12-11 LAB — MAGNESIUM: MAGNESIUM: 2.1 mg/dL (ref 1.5–2.5)

## 2017-12-11 NOTE — Assessment & Plan Note (Signed)
Not acute and not likely appendix. We talked about possibility of cold inducing muscle or esophageal spasm. Checking CMP, CBC, lipase. We discussed that it is not likely that this is related to gallbladder. If this is persistent we can check [place] stomach and he is advised to avoid very cold beverages in the meantime.

## 2017-12-11 NOTE — Patient Instructions (Signed)
We will check the labs today and call you back with the results.    

## 2017-12-11 NOTE — Progress Notes (Signed)
   Subjective:    Patient ID: Hector [name], male    DOB: 1951/11/08, 67 y.o.   MRN: [id number]  HPI The patient is a 67 YO man coming in for pain in his right lower stomach after drinking a cold water with ice. This happened twice both after drinking a cold ice water. He was concerned that he had eaten fried foods the evening before. The pain was sharp about 8/10 pain and lasted about 1 hour then went away. Denies fevers or chills. No new foods or change in diet. Denies nausea or vomiting. Denies constipation or diarrhea. No blood in stool. He did not take anything to help it get better due to how quickly it went away. No GERD symptoms. Did not feel similar to prior kidney stone he had. No blood in urine. Last episode several days ago and feels normal today.   Review of Systems  Constitutional: Negative.   HENT: Negative.   Eyes: Negative.   Respiratory: Negative for cough, chest tightness and shortness of breath.   Cardiovascular: Negative for chest pain, palpitations and leg swelling.  Gastrointestinal: Positive for abdominal pain. Negative for abdominal distention, constipation, diarrhea, nausea and vomiting.  Musculoskeletal: Negative.   Skin: Negative.   Neurological: Negative.   Psychiatric/Behavioral: Negative.       Objective:   Physical Exam  Constitutional: He is oriented to person, place, and time. He appears well-developed and well-nourished.  HENT:  Head: Normocephalic and atraumatic.  Eyes: EOM are normal.  Neck: Normal range of motion.  Cardiovascular: Normal rate and regular rhythm.  Pulmonary/Chest: Effort normal and breath sounds normal. No respiratory distress. He has no wheezes. He has no rales.  Abdominal: Soft. Bowel sounds are normal. He exhibits no distension. There is no tenderness. There is no rebound.  Musculoskeletal: He exhibits no edema.  Neurological: He is alert and oriented to person, place, and time. Coordination normal.  Skin: Skin is warm and dry.    Psychiatric: He has a normal mood and affect.   Vitals:   12/11/17 1604  BP: 128/90  Pulse: (!) 58  Temp: 98.2 F (36.8 C)  TempSrc: Oral  SpO2: 100%  Weight: 221 lb (100.2 kg)  Height: 5\' 9"  (1.753 m)      Assessment & Plan:

## 2018-01-06 ENCOUNTER — Encounter: Payer: Self-pay | Admitting: Internal Medicine

## 2018-01-07 ENCOUNTER — Other Ambulatory Visit: Payer: Self-pay

## 2018-01-07 MED ORDER — PANTOPRAZOLE SODIUM 40 MG PO TBEC: 40.0000 mg | DELAYED_RELEASE_TABLET | Freq: Every day | ORAL | 0 refills | Status: DC

## 2018-01-07 MED ORDER — PANTOPRAZOLE SODIUM 40 MG PO TBEC: 40.0000 mg | DELAYED_RELEASE_TABLET | Freq: Every day | ORAL | 2 refills | Status: DC

## 2018-01-10 ENCOUNTER — Encounter: Payer: Self-pay | Admitting: Gastroenterology

## 2018-01-14 ENCOUNTER — Ambulatory Visit (INDEPENDENT_AMBULATORY_CARE_PROVIDER_SITE_OTHER): Payer: Medicare Other | Admitting: Internal Medicine

## 2018-01-14 ENCOUNTER — Encounter: Payer: Self-pay | Admitting: Internal Medicine

## 2018-01-14 DIAGNOSIS — M25511 Pain in right shoulder: Secondary | ICD-10-CM | POA: Diagnosis not present

## 2018-01-14 MED ORDER — PREDNISONE 20 MG PO TABS: 40.0000 mg | ORAL_TABLET | Freq: Every day | ORAL | 0 refills | Status: DC

## 2018-01-14 NOTE — Progress Notes (Signed)
   Subjective:    Patient ID: Hector [name], male    DOB: 1951/03/31, 67 y.o.   MRN: [id number]  HPI The patient is a 67 YO man coming in for right shoulder pain ongoing for about 2-3 weeks now. He was taking tylenol but was told at the [place] that he should not take tylenol. He denies injury or overuse. He denies numbness or weakness. He has been avoiding using the arm lately due to pain. It is limited in ROM and cannot move arm backwards and over his head. Overall worsening some.   Review of Systems  Constitutional: Positive for activity change. Negative for appetite change, fatigue, fever and unexpected weight change.  Respiratory: Negative.   Cardiovascular: Negative.   Musculoskeletal: Positive for arthralgias, joint swelling and myalgias. Negative for back pain, neck pain and neck stiffness.  Skin: Negative.   Neurological: Negative for syncope, weakness and numbness.      Objective:   Physical Exam  Constitutional: He is oriented to person, place, and time. He appears well-developed and well-nourished.  HENT:  Head: Normocephalic and atraumatic.  Eyes: EOM are normal.  Neck: Normal range of motion.  Cardiovascular: Normal rate and regular rhythm.  Pulmonary/Chest: Effort normal and breath sounds normal. No respiratory distress. He has no wheezes. He has no rales.  Abdominal: Soft. Bowel sounds are normal. He exhibits no distension. There is no tenderness. There is no rebound.  Musculoskeletal: He exhibits tenderness. He exhibits no edema.  Pain over the [facility] joint, limitation of ROM active and passive  Neurological: He is alert and oriented to person, place, and time. Coordination normal.  Skin: Skin is warm and dry.  Psychiatric: He has a normal mood and affect.   Vitals:   01/14/18 0849  BP: 116/86  Pulse: (!) 54  Temp: 97.8 F (36.6 C)  TempSrc: Oral  SpO2: 100%  Weight: 220 lb (99.8 kg)  Height: 5\' 9"  (1.753 m)      Assessment & Plan:

## 2018-01-14 NOTE — Assessment & Plan Note (Signed)
Suspect bursitis and rx for prednisone burst. He will make a visit with sports medicine for possible injection if no resolution. Encouraged gentle stretching exercises as pain resolves. Okay for tylenol for pain control.

## 2018-01-14 NOTE — Patient Instructions (Signed)
We have taken the prednisone to take 2 pills daily for 5 days.   It is okay to take tylenol up to 3000 mg daily.   Set up a visit with a sports medicine doctor here in 1-2 weeks in case you are not better.

## 2018-01-15 ENCOUNTER — Encounter: Payer: Self-pay | Admitting: Gastroenterology

## 2018-01-29 ENCOUNTER — Ambulatory Visit: Payer: TRICARE For Life (TFL) | Admitting: Family Medicine

## 2018-02-08 ENCOUNTER — Encounter: Payer: Self-pay | Admitting: Internal Medicine

## 2018-02-13 ENCOUNTER — Ambulatory Visit: Payer: Self-pay

## 2018-02-13 ENCOUNTER — Encounter: Payer: Self-pay | Admitting: Family Medicine

## 2018-02-13 ENCOUNTER — Ambulatory Visit (INDEPENDENT_AMBULATORY_CARE_PROVIDER_SITE_OTHER): Payer: Medicare Other | Admitting: Family Medicine

## 2018-02-13 VITALS — BP 136/74 | HR 69 | Temp 98.2°F | Ht 69.0 in | Wt 218.0 lb

## 2018-02-13 DIAGNOSIS — M25511 Pain in right shoulder: Secondary | ICD-10-CM | POA: Diagnosis not present

## 2018-02-13 DIAGNOSIS — M7591 Shoulder lesion, unspecified, right shoulder: Secondary | ICD-10-CM | POA: Diagnosis not present

## 2018-02-13 MED ORDER — NITROGLYCERIN 0.2 MG/HR TD PT24: MEDICATED_PATCH | TRANSDERMAL | 11 refills | Status: DC

## 2018-02-13 NOTE — Progress Notes (Signed)
Hector [name] - 67 y.o. male MRN [id number]  Date of birth: 1951/03/16  SUBJECTIVE:  Including CC & ROS.  Chief Complaint  Patient presents with  . Right shoulder pain    Hector [name] is a 67 y.o. male that is presenting with right shoulder pain. Ongoing for one month. Pain is located on the lateral aspect of his right shoulder. Pain is described as a sharp pain upon flexion. Denies injury. He has been taking motrin and applying ice/heat. He works out three time a week. Admits to tingling and numbness. Denies injury or trauma. Pain is localized to the shoulder. He denies any prior history of similar problems.  He was seen on 2/11 and provided prednisone with little improvement.  Review of Systems  Constitutional: Negative for fever.  HENT: Negative for congestion.   Respiratory: Negative for cough.   Cardiovascular: Negative for chest pain.  Gastrointestinal: Negative for abdominal pain.  Musculoskeletal: Negative for gait problem.  Skin: Negative for color change.  Neurological: Negative for weakness.  Hematological: Negative for adenopathy.  Psychiatric/Behavioral: Negative for agitation.    HISTORY: Past Medical, Surgical, Social, and Family History Reviewed & Updated per EMR.   Pertinent Historical Findings include:  Past Medical History:  Diagnosis Date  . Arthritis   . Chronic kidney disease   . Hyperlipidemia   . Hypertension   . Tuberculosis     Past Surgical History:  Procedure Laterality Date  . PROSTATE SURGERY  12/2012   no cancer    Allergies  Allergen Reactions  . Shellfish Allergy Hives    Family History  Problem Relation Age of Onset  . Arthritis Mother   . Hyperlipidemia Mother   . Heart disease Mother   . Stomach cancer Mother   . Colon polyps Mother   . Arthritis Father   . Hyperlipidemia Father   . Heart disease Father   . Kidney disease Father   . Breast cancer Sister   . Hypertension Sister   . Ovarian cancer Sister   . Gallbladder  disease Sister        x 6  . Colon cancer Neg Hx   . Liver cancer Neg Hx   . Rectal cancer Neg Hx   . Esophageal cancer Neg Hx      Social History   Socioeconomic History  . Marital status: Married    Spouse name: Not on file  . Number of children: 2  . Years of education: Not on file  . Highest education level: Not on file  Social Needs  . Financial resource strain: Not on file  . Food insecurity - worry: Not on file  . Food insecurity - inability: Not on file  . Transportation needs - medical: Not on file  . Transportation needs - non-medical: Not on file  Occupational History  . Occupation: retired  Tobacco Use  . Smoking status: Never Smoker  . Smokeless tobacco: Never Used  Substance and Sexual Activity  . Alcohol use: No    Alcohol/week: 0.0 oz  . Drug use: No  . Sexual activity: Not on file  Other Topics Concern  . Not on file  Social History Narrative  . Not on file     PHYSICAL EXAM:  VS: BP 136/74 (BP Location: Left Arm, Patient Position: Sitting, Cuff Size: Normal)   Pulse 69   Temp 98.2 F (36.8 C) (Oral)   Ht 5\' 9"  (1.753 m)   Wt 218 lb (98.9 kg)   SpO2 100%  BMI 32.19 kg/m  Physical Exam Gen: NAD, alert, cooperative with exam, well-appearing ENT: normal lips, normal nasal mucosa,  Eye: normal EOM, normal conjunctiva and lids CV:  no edema, +2 pedal pulses   Resp: no accessory muscle use, non-labored,  GI: no masses or tenderness, no hernia  Skin: no rashes, no areas of induration  Neuro: normal tone, normal sensation to touch Psych:  normal insight, alert and oriented MSK:  Right shoulder: Normal active flexion and abduction. Normal external rotation. Normal strength resistance with external and internal rotation. Pain with empty can testing and Hawkins testing. Normal grip strength. Neurovascularly intact.  Limited ultrasound: Right shoulder:  Normal-appearing biceps tendon Supraspinatus is thickened with hypoechoic changes to  suggest a tendinopathy. There may be a small articular sided oblique tear  Summary: Supraspinatus tendinopathy  Ultrasound and interpretation by Clearance Coots, MD   Aspiration/Injection Procedure Note Hector [name] 1950/12/20  Procedure: Injection Indications: Right shoulder pain  Procedure Details Consent: Risks of procedure as well as the alternatives and risks of each were explained to the (patient/caregiver).  Consent for procedure obtained. Time Out: Verified patient identification, verified procedure, site/side was marked, verified correct patient position, special equipment/implants available, medications/allergies/relevent history reviewed, required imaging and test results available.  Performed.  The area was cleaned with iodine and alcohol swabs.    The right subacromial space was injected using 1 cc's of 40 mg Depomedrol and 4 cc's of 0.25% bupivacaine with a 25 1 1/2" needle.  Ultrasound was used. Images were obtained in Long views showing the injection.    A sterile dressing was applied.  Patient did tolerate procedure well.      ASSESSMENT & PLAN:   Supraspinatus tendinitis It appears the supraspinatus has changes that suggest a tendinopathy. The [facility] joint does have dengenrative changes and an effusion.  - subacromial injection today  - counseled on HEP  - thera band.  - nitro patches  - f/u in 4-6 weeks to rescan  - if no improvement could try an [facility] joint injection

## 2018-02-13 NOTE — Assessment & Plan Note (Signed)
It appears the supraspinatus has changes that suggest a tendinopathy. The [facility] joint does have dengenrative changes and an effusion.  - subacromial injection today  - counseled on HEP  - thera band.  - nitro patches  - f/u in 4-6 weeks to rescan  - if no improvement could try an [facility] joint injection

## 2018-02-13 NOTE — Patient Instructions (Signed)
Please try the exercises   Please follow up with me in 4-6 weeks if your pain has no improvement   Nitroglycerin Protocol   Apply 1/4 nitroglycerin patch to affected area daily.  Change position of patch within the affected area every 24 hours.  You may experience a headache during the first 1-2 weeks of using the patch, these should subside.  If you experience headaches after beginning nitroglycerin patch treatment, you may take your preferred over the counter pain reliever.  Another side effect of the nitroglycerin patch is skin irritation or rash related to patch adhesive.  Please notify our office if you develop more severe headaches or rash, and stop the patch.  Tendon healing with nitroglycerin patch may require 12 to 24 weeks depending on the extent of injury.  Men should not use if taking Viagra, Cialis, or Levitra.   Do not use if you have migraines or rosacea.

## 2018-02-21 ENCOUNTER — Ambulatory Visit (AMBULATORY_SURGERY_CENTER): Payer: Self-pay | Admitting: *Deleted

## 2018-02-21 ENCOUNTER — Other Ambulatory Visit: Payer: Self-pay

## 2018-02-21 VITALS — Ht 69.0 in | Wt 218.2 lb

## 2018-02-21 DIAGNOSIS — Z86010 Personal history of colonic polyps: Secondary | ICD-10-CM

## 2018-02-21 MED ORDER — PEG-KCL-NACL-NASULF-NA ASC-C 140 G PO SOLR: 1.0000 | ORAL | 0 refills | Status: DC

## 2018-02-21 NOTE — Progress Notes (Signed)
No egg or soy allergy known to patient  No issues with past sedation with any surgeries  or procedures, no intubation problems  No diet pills per patient No home 02 use per patient  No blood thinners per patient  Pt denies issues with constipation  No A fib or A flutter  EMMI video sent to pt's e mail - pt declined  plenvu sample- lot [id number]  Exp 05/2019 As directed

## 2018-03-07 ENCOUNTER — Other Ambulatory Visit: Payer: Self-pay

## 2018-03-07 ENCOUNTER — Ambulatory Visit (AMBULATORY_SURGERY_CENTER): Payer: Medicare Other | Admitting: Gastroenterology

## 2018-03-07 ENCOUNTER — Encounter: Payer: Self-pay | Admitting: Gastroenterology

## 2018-03-07 VITALS — BP 114/57 | HR 50 | Temp 97.3°F | Resp 12 | Ht 69.0 in | Wt 218.0 lb

## 2018-03-07 DIAGNOSIS — Z86010 Personal history of colonic polyps: Secondary | ICD-10-CM | POA: Diagnosis not present

## 2018-03-07 DIAGNOSIS — Z1211 Encounter for screening for malignant neoplasm of colon: Secondary | ICD-10-CM | POA: Diagnosis not present

## 2018-03-07 MED ORDER — SODIUM CHLORIDE 0.9 % IV SOLN: 500.0000 mL | Freq: Once | INTRAVENOUS | Status: DC

## 2018-03-07 NOTE — Patient Instructions (Signed)
Impression/Recommendations:  Diverticulosis handout given to patient.  Resume previous diet. Continue present medications.  Repeat colonoscopy in 10 years for screening purposes.  YOU HAD AN ENDOSCOPIC PROCEDURE TODAY AT THE [facility]:   Refer to the procedure report that was given to you for any specific questions about what was found during the examination.  If the procedure report does not answer your questions, please call your gastroenterologist to clarify.  If you requested that your care partner not be given the details of your procedure findings, then the procedure report has been included in a sealed envelope for you to review at your convenience later.  YOU SHOULD EXPECT: Some feelings of bloating in the abdomen. Passage of more gas than usual.  Walking can help get rid of the air that was put into your GI tract during the procedure and reduce the bloating. If you had a lower endoscopy (such as a colonoscopy or flexible sigmoidoscopy) you may notice spotting of blood in your stool or on the toilet paper. If you underwent a bowel prep for your procedure, you may not have a normal bowel movement for a few days.  Please Note:  You might notice some irritation and congestion in your nose or some drainage.  This is from the oxygen used during your procedure.  There is no need for concern and it should clear up in a day or so.  SYMPTOMS TO REPORT IMMEDIATELY:   Following lower endoscopy (colonoscopy or flexible sigmoidoscopy):  Excessive amounts of blood in the stool  Significant tenderness or worsening of abdominal pains  Swelling of the abdomen that is new, acute  Fever of 100F or higher For urgent or emergent issues, a gastroenterologist can be reached at any hour by calling [phone].   DIET:  We do recommend a small meal at first, but then you may proceed to your regular diet.  Drink plenty of fluids but you should avoid alcoholic beverages for 24  hours.  ACTIVITY:  You should plan to take it easy for the rest of today and you should NOT DRIVE or use heavy machinery until tomorrow (because of the sedation medicines used during the test).    FOLLOW UP: Our staff will call the number listed on your records the next business day following your procedure to check on you and address any questions or concerns that you may have regarding the information given to you following your procedure. If we do not reach you, we will leave a message.  However, if you are feeling well and you are not experiencing any problems, there is no need to return our call.  We will assume that you have returned to your regular daily activities without incident.  If any biopsies were taken you will be contacted by phone or by letter within the next 1-3 weeks.  Please call us at [phone] if you have not heard about the biopsies in 3 weeks.    SIGNATURES/CONFIDENTIALITY: You and/or your care partner have signed paperwork which will be entered into your electronic medical record.  These signatures attest to the fact that that the information above on your After Visit Summary has been reviewed and is understood.  Full responsibility of the confidentiality of this discharge information lies with you and/or your care-partner. 

## 2018-03-07 NOTE — Op Note (Signed)
[place] Patient Name: Hector [name] Procedure Date: 03/07/2018 8:45 AM MRN: [id number] Endoscopist: Mallie Mussel L. Loletha Carrow , MD Age: 67 Referring MD:  Date of Birth: 1951-10-06 Gender: Male Account #: [id number] Procedure:                Colonoscopy Indications:              Surveillance: Personal history of polyp on last                            colonoscopy > 5 years ago (ablated polyp of unknown                            pathology 02/2013) Medicines:                Monitored Anesthesia Care Procedure:                Pre-Anesthesia Assessment:                           - Prior to the procedure, a History and Physical                            was performed, and patient medications and                            allergies were reviewed. The patient's tolerance of                            previous anesthesia was also reviewed. The risks                            and benefits of the procedure and the sedation                            options and risks were discussed with the patient.                            All questions were answered, and informed consent                            was obtained. Anticoagulants: The patient has taken                            aspirin. It was decided not to withhold this                            medication prior to the procedure. ASA Grade                            Assessment: II - A patient with mild systemic                            disease. After reviewing the risks and benefits,  the patient was deemed in satisfactory condition to                            undergo the procedure.                           After obtaining informed consent, the colonoscope                            was passed under direct vision. Throughout the                            procedure, the patient's blood pressure, pulse, and                            oxygen saturations were monitored continuously. The          Colonoscope was introduced through the anus and                            advanced to the the cecum, identified by                            appendiceal orifice and ileocecal valve. The                            colonoscopy was performed without difficulty. The                            patient tolerated the procedure well. The quality                            of the bowel preparation was excellent. The                            ileocecal valve, appendiceal orifice, and rectum                            were photographed. The quality of the bowel                            preparation was evaluated using the BBPS [facility]                            Bowel Preparation Scale) with scores of: Right                            Colon = 3, Transverse Colon = 3 and Left Colon = 3                            (entire mucosa seen well with no residual staining,                            small fragments of stool or opaque liquid). The  total BBPS score equals 9. The bowel preparation                            used was Plenvu. Scope In: 9:00:08 AM Scope Out: 9:13:52 AM Scope Withdrawal Time: 0 hours 11 minutes 52 seconds  Total Procedure Duration: 0 hours 13 minutes 44 seconds  Findings:                 The perianal and digital rectal examinations were                            normal.                           A few small-mouthed diverticula were found in the                            sigmoid colon.                           The exam was otherwise without abnormality on                            direct and retroflexion views. Complications:            No immediate complications. Estimated Blood Loss:     Estimated blood loss: none. Impression:               - Diverticulosis in the sigmoid colon.                           - The examination was otherwise normal on direct                            and retroflexion views.                           - No  specimens collected. Recommendation:           - Patient has a contact number available for                            emergencies. The signs and symptoms of potential                            delayed complications were discussed with the                            patient. Return to normal activities tomorrow.                            Written discharge instructions were provided to the                            patient.                           - Resume previous diet.                           -  Continue present medications.                           - Repeat colonoscopy in 10 years for screening                            purposes. Hector [name] L. Loletha Carrow, MD 03/07/2018 9:19:42 AM This report has been signed electronically.

## 2018-03-07 NOTE — Progress Notes (Signed)
To PACU, VSS. Report to [email or web address] 

## 2018-03-08 ENCOUNTER — Telehealth: Payer: Self-pay

## 2018-03-08 NOTE — Telephone Encounter (Signed)
Attempted to reach pt. With follow up call following endoscopic procedure 03/07/2018.  No answer at given no.  Will try to reach pt. Again today.

## 2018-03-08 NOTE — Telephone Encounter (Signed)
Attempted to reach pt. With follow up call following endoscopic procedure 03/07/2018.  LM on pt.'s ans. Machine to call us if he has any questions or concerns.

## 2018-03-20 DIAGNOSIS — N201 Calculus of ureter: Secondary | ICD-10-CM | POA: Diagnosis not present

## 2018-06-14 DIAGNOSIS — H43812 Vitreous degeneration, left eye: Secondary | ICD-10-CM | POA: Diagnosis not present

## 2018-06-14 DIAGNOSIS — H25013 Cortical age-related cataract, bilateral: Secondary | ICD-10-CM | POA: Diagnosis not present

## 2018-06-14 DIAGNOSIS — H2513 Age-related nuclear cataract, bilateral: Secondary | ICD-10-CM | POA: Diagnosis not present

## 2018-06-14 DIAGNOSIS — H40013 Open angle with borderline findings, low risk, bilateral: Secondary | ICD-10-CM | POA: Diagnosis not present

## 2018-07-31 ENCOUNTER — Encounter: Payer: Self-pay | Admitting: Family Medicine

## 2018-07-31 ENCOUNTER — Ambulatory Visit (INDEPENDENT_AMBULATORY_CARE_PROVIDER_SITE_OTHER): Payer: Medicare Other | Admitting: Family Medicine

## 2018-07-31 ENCOUNTER — Ambulatory Visit (INDEPENDENT_AMBULATORY_CARE_PROVIDER_SITE_OTHER): Payer: Medicare Other

## 2018-07-31 VITALS — BP 130/82 | HR 51 | Temp 97.8°F

## 2018-07-31 DIAGNOSIS — M7591 Shoulder lesion, unspecified, right shoulder: Secondary | ICD-10-CM

## 2018-07-31 DIAGNOSIS — M7500 Adhesive capsulitis of unspecified shoulder: Secondary | ICD-10-CM | POA: Insufficient documentation

## 2018-07-31 DIAGNOSIS — M7501 Adhesive capsulitis of right shoulder: Secondary | ICD-10-CM

## 2018-07-31 HISTORY — DX: Adhesive capsulitis of unspecified shoulder: M75.00

## 2018-07-31 MED ORDER — TRIAMCINOLONE ACETONIDE 40 MG/ML IJ SUSP: 40.0000 mg | Freq: Once | INTRAMUSCULAR | Status: DC

## 2018-07-31 NOTE — Assessment & Plan Note (Signed)
Pain today is more suggestive of early capsulitis. Less likely for subacromial problem or tendinitis based on exam.  - GH injection today  - counseled on HEP  - counseled on supportive care - If no improvement consider imaging or PT.

## 2018-07-31 NOTE — Patient Instructions (Addendum)
Good to see you  Please try to do the exercises as much as you can  Please try the vimovo for pain  Please see me back in 4-6 weeks if your pain hasn't improved.

## 2018-07-31 NOTE — Progress Notes (Signed)
Hector [name] - 67 y.o. male MRN [id number]  Date of birth: 12/26/1950  SUBJECTIVE:  Including CC & ROS.  Chief Complaint  Patient presents with  . Shoulder Pain    R shoulder has been hurting since Thursday, Has very limited ROM    Hector [name] is a 67 y.o. male that is presenting with right shoulder pain. 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 He denies any injury or inciting event. The pain is severe and he has trouble sleeping. Has not tried anything for the pain. Has been continuing the nitro patches for the supraspinatus tendinopathy. He denies radicular symptoms. No numbness or tingling.     Review of Systems  Constitutional: Negative for fever.  HENT: Negative for congestion.   Respiratory: Negative for cough.   Cardiovascular: Negative for chest pain.  Gastrointestinal: Negative for abdominal pain.  Musculoskeletal: Negative for back pain.  Skin: Negative for color change.  Neurological: Negative for weakness.  Hematological: Negative for adenopathy.  Psychiatric/Behavioral: Negative for agitation.    HISTORY: Past Medical, Surgical, Social, and Family History Reviewed & Updated per EMR.   Pertinent Historical Findings include:  Past Medical History:  Diagnosis Date  . Allergy   . Arthritis   . Chronic kidney disease    kidney stones  . GERD (gastroesophageal reflux disease)   . Hyperlipidemia   . Hypertension   . Tuberculosis     Past Surgical History:  Procedure Laterality Date  . COLONOSCOPY    . POLYPECTOMY    . PROSTATE SURGERY  12/2012   no cancer    Allergies  Allergen Reactions  . Shellfish Allergy Hives    Family History  Problem Relation Age of Onset  . Arthritis Mother   . Hyperlipidemia Mother   . Heart disease Mother   . Stomach cancer Mother   . Colon polyps Mother   . Arthritis Father   . Hyperlipidemia Father   . Heart disease Father   . Kidney disease Father   . Breast cancer Sister   . Hypertension  Sister   . Ovarian cancer Sister   . Gallbladder disease Sister        x 6  . Colon cancer Neg Hx   . Liver cancer Neg Hx   . Rectal cancer Neg Hx   . Esophageal cancer Neg Hx      Social History   Socioeconomic History  . Marital status: Married    Spouse name: Not on file  . Number of children: 2  . Years of education: Not on file  . Highest education level: Not on file  Occupational History  . Occupation: retired  Scientific laboratory technician  . Financial resource strain: Not on file  . Food insecurity:    Worry: Not on file    Inability: Not on file  . Transportation needs:    Medical: Not on file    Non-medical: Not on file  Tobacco Use  . Smoking status: Never Smoker  . Smokeless tobacco: Never Used  Substance and Sexual Activity  . Alcohol use: No    Alcohol/week: 0.0 standard drinks  . Drug use: No  . Sexual activity: Not on file  Lifestyle  . Physical activity:    Days per week: Not on file    Minutes per session: Not on file  . Stress: Not on file  Relationships  . Social connections:    Talks on phone: Not on file    Gets together: Not  on file    Attends religious service: Not on file    Active member of club or organization: Not on file    Attends meetings of clubs or organizations: Not on file    Relationship status: Not on file  . Intimate partner violence:    Fear of current or ex partner: Not on file    Emotionally abused: Not on file    Physically abused: Not on file    Forced sexual activity: Not on file  Other Topics Concern  . Not on file  Social History Narrative  . Not on file     PHYSICAL EXAM:  VS: BP 130/82 (BP Location: Right Arm, Patient Position: Sitting, Cuff Size: Normal)   Pulse (!) 51   Temp 97.8 F (36.6 C) (Oral)   SpO2 96%  Physical Exam Gen: NAD, alert, cooperative with exam, well-appearing ENT: normal lips, normal nasal mucosa,  Eye: normal EOM, normal conjunctiva and lids CV:  no edema, +2 pedal pulses   Resp: no accessory  muscle use, non-labored,  Skin: no rashes, no areas of induration  Neuro: normal tone, normal sensation to touch Psych:  normal insight, alert and oriented MSK:  Right shoulder:  Inspection reveals no abnormalities, atrophy or asymmetry. Palpation is normal with no tenderness over AC joint  Pain with ER. Pain with ER and abduction  Rotator cuff strength normal throughout.  negative  Hawkin's tests, empty can sign. Speeds tests with mild pain  Normal scapular function observed. Neurovascularly intact   Limited ultrasound: right shoulder:  Normal appearing BT  Normal appearing subscap in static and dynamic testing  Supraspinatus with mild tendinopathy type changes. Normal function dynamically.  AC joint with degenerative changes and geyser sign   Summary: AC joint effusion and mild supraspinatus tendinopathy   Ultrasound and interpretation by Clearance Coots, MD   Aspiration/Injection Procedure Note Axel Frisk Aug 30, 1951  Procedure: Injection Indications: right shoulder pain   Procedure Details Consent: Risks of procedure as well as the alternatives and risks of each were explained to the (patient/caregiver).  Consent for procedure obtained. Time Out: Verified patient identification, verified procedure, site/side was marked, verified correct patient position, special equipment/implants available, medications/allergies/relevent history reviewed, required imaging and test results available.  Performed.  The area was cleaned with iodine and alcohol swabs.    The right GH joint was injected using 1 cc's of 40 mg kenalog and 7 cc's of 0.25% bupivacine with a 22 3 1/2" needle.  Ultrasound was used. Images were obtained in Transverse  views showing the injection.    A sterile dressing was applied.  Patient did tolerate procedure well.      ASSESSMENT & PLAN:   Adhesive capsulitis Pain today is more suggestive of early capsulitis. Less likely for subacromial problem or  tendinitis based on exam.  - GH injection today  - counseled on HEP  - counseled on supportive care - If no improvement consider imaging or PT.

## 2018-08-19 ENCOUNTER — Ambulatory Visit (INDEPENDENT_AMBULATORY_CARE_PROVIDER_SITE_OTHER): Payer: Medicare Other | Admitting: Internal Medicine

## 2018-08-19 ENCOUNTER — Encounter: Payer: Self-pay | Admitting: Internal Medicine

## 2018-08-19 VITALS — BP 124/84 | HR 68 | Temp 98.1°F | Ht 69.0 in | Wt 217.0 lb

## 2018-08-19 DIAGNOSIS — Z23 Encounter for immunization: Secondary | ICD-10-CM | POA: Diagnosis not present

## 2018-08-19 DIAGNOSIS — M79645 Pain in left finger(s): Secondary | ICD-10-CM | POA: Diagnosis not present

## 2018-08-19 MED ORDER — CEPHALEXIN 500 MG PO CAPS: 500.0000 mg | ORAL_CAPSULE | Freq: Two times a day (BID) | ORAL | 0 refills | Status: DC

## 2018-08-19 NOTE — Assessment & Plan Note (Signed)
Rx for keflex 5 day course for cuticle infection likely related to trauma to the nail. Advised he can still use epsom salt bath or heat for pain or tylenol. As this is improving currently with pain and size no indication for I and D.

## 2018-08-19 NOTE — Patient Instructions (Signed)
We have sent in keflex to take 1 pill twice a day for 5 days to clear the infection in the thumb.   We have given you the flu shot and pneumonia shot today.

## 2018-08-19 NOTE — Progress Notes (Signed)
   Subjective:    Patient ID: Hector [name], male    DOB: 1951/03/30, 67 y.o.   MRN: [id number]  HPI The patient is a 67 YO man coming in for left thumb pain. Started about 3-4 days ago. He does not recall specific injury or overuse. Does work outdoors. Up to date on tetanus. Tried epsom salt bath several times per day which has helped some. Overall it is improving gradually. He is still having pain to touch. No bruising or red rash on the skin. Denies fevers or chills.  Pain was 6/10 now about 2-3/10.   Review of Systems  Constitutional: Positive for activity change. Negative for appetite change, fatigue, fever and unexpected weight change.  Respiratory: Negative.   Cardiovascular: Negative.   Musculoskeletal: Positive for arthralgias, joint swelling and myalgias. Negative for back pain.  Skin: Positive for color change.  Neurological: Negative for syncope, weakness and numbness.      Objective:   Physical Exam  Constitutional: He is oriented to person, place, and time. He appears well-developed and well-nourished.  HENT:  Head: Normocephalic and atraumatic.  Eyes: EOM are normal.  Neck: Normal range of motion.  Cardiovascular: Normal rate and regular rhythm.  Pulmonary/Chest: Effort normal and breath sounds normal. No respiratory distress. He has no wheezes. He has no rales.  Abdominal: Soft.  Musculoskeletal: He exhibits tenderness. He exhibits no edema.  Left thumb with cuticle infection tender to touch, no surrounding cellulitis. No pus expressible.  Neurological: He is alert and oriented to person, place, and time. Coordination normal.  Skin: Skin is warm and dry.   Vitals:   08/19/18 0802  BP: 124/84  Pulse: 68  Temp: 98.1 F (36.7 C)  TempSrc: Oral  SpO2: 98%  Weight: 217 lb (98.4 kg)  Height: 5\' 9"  (1.753 m)      Assessment & Plan:  Prevnar 13 and flu given at visit

## 2018-08-26 ENCOUNTER — Other Ambulatory Visit: Payer: Self-pay | Admitting: Internal Medicine

## 2018-08-27 ENCOUNTER — Encounter: Payer: Self-pay | Admitting: Endocrinology

## 2018-08-27 ENCOUNTER — Ambulatory Visit (INDEPENDENT_AMBULATORY_CARE_PROVIDER_SITE_OTHER): Payer: Medicare Other | Admitting: Endocrinology

## 2018-08-27 VITALS — BP 114/82 | HR 62 | Ht 69.0 in | Wt 218.8 lb

## 2018-08-27 DIAGNOSIS — E213 Hyperparathyroidism, unspecified: Secondary | ICD-10-CM | POA: Diagnosis not present

## 2018-08-27 DIAGNOSIS — E559 Vitamin D deficiency, unspecified: Secondary | ICD-10-CM

## 2018-08-27 LAB — VITAMIN D 25 HYDROXY (VIT D DEFICIENCY, FRACTURES): VITD: 32.36 ng/mL (ref 30.00–100.00)

## 2018-08-27 NOTE — Patient Instructions (Signed)
blood tests are requested for you today.  We'll let you know about the results. Please come back for a follow-up appointment in 1 year.   

## 2018-08-27 NOTE — Progress Notes (Signed)
Subjective:    Patient ID: Hector [name], male    DOB: [birth year], 67 y.o.   MRN: [id number]  HPI Primary hyperparathyroidism (dx'ed 2013; HCTZ was changed to lasix; DEXA was normal in 2016 and 2018; he had episode of urolithiasis in 2018).  No change in chronic arthralgias.   Vit-D deficiency (he has had fxs of left little finger, coccyx, and left great toe--all with significant injuries): he denies cramps.  He takes vit-D, 2000 units/d.  Past Medical History:  Diagnosis Date  . Allergy   . Arthritis   . Chronic kidney disease    kidney stones  . GERD (gastroesophageal reflux disease)   . Hyperlipidemia   . Hypertension   . Tuberculosis     Past Surgical History:  Procedure Laterality Date  . COLONOSCOPY    . POLYPECTOMY    . PROSTATE SURGERY  12/2012   no cancer    Social History   Socioeconomic History  . Marital status: Married    Spouse name: Not on file  . Number of children: 2  . Years of education: Not on file  . Highest education level: Not on file  Occupational History  . Occupation: retired  Scientific laboratory technician  . Financial resource strain: Not on file  . Food insecurity:    Worry: Not on file    Inability: Not on file  . Transportation needs:    Medical: Not on file    Non-medical: Not on file  Tobacco Use  . Smoking status: Never Smoker  . Smokeless tobacco: Never Used  Substance and Sexual Activity  . Alcohol use: No    Alcohol/week: 0.0 standard drinks  . Drug use: No  . Sexual activity: Not on file  Lifestyle  . Physical activity:    Days per week: Not on file    Minutes per session: Not on file  . Stress: Not on file  Relationships  . Social connections:    Talks on phone: Not on file    Gets together: Not on file    Attends religious service: Not on file    Active member of club or organization: Not on file    Attends meetings of clubs or organizations: Not on file    Relationship status: Not on file  . Intimate partner violence:    Fear  of current or ex partner: Not on file    Emotionally abused: Not on file    Physically abused: Not on file    Forced sexual activity: Not on file  Other Topics Concern  . Not on file  Social History Narrative  . Not on file    Current Outpatient Medications on File Prior to Visit  Medication Sig Dispense Refill  . amLODipine (NORVASC) 2.5 MG tablet Take 1 tablet (2.5 mg total) by mouth daily. 90 tablet 3  . aspirin 81 MG tablet Take 81 mg by mouth daily.    . cetirizine (ZYRTEC) 10 MG tablet Take 10 mg by mouth daily.    . fluticasone (FLONASE) 50 MCG/ACT nasal spray Place 2 sprays into both nostrils daily. 48 g 3  . furosemide (LASIX) 20 MG tablet TAKE 1 TABLET DAILY 90 tablet 0  . losartan (COZAAR) 100 MG tablet TAKE 1 TABLET DAILY 90 tablet 1  . lovastatin (MEVACOR) 40 MG tablet Take 0.5 tablets (20 mg total) by mouth at bedtime. 45 tablet 3  . meloxicam (MOBIC) 15 MG tablet Take 15 mg by mouth daily.    . pantoprazole (  PROTONIX) 40 MG tablet TAKE 1 TABLET DAILY 90 tablet 1  . tamsulosin (FLOMAX) 0.4 MG CAPS capsule Take 1 capsule (0.4 mg total) by mouth daily. 10 capsule 0  . terazosin (HYTRIN) 10 MG capsule Take 10 mg by mouth at bedtime.    . triamcinolone cream (KENALOG) 0.1 % Apply 1 application topically 2 (two) times daily. 80 g 6  . nitroGLYCERIN (NITRODUR - DOSED IN MG/24 HR) 0.2 mg/hr patch Cut and apply 1/4 patch to most painful area q24h. (Patient not taking: Reported on 08/27/2018) 30 patch 11   No current facility-administered medications on file prior to visit.     Allergies  Allergen Reactions  . Shellfish Allergy Hives    Family History  Problem Relation Age of Onset  . Arthritis Mother   . Hyperlipidemia Mother   . Heart disease Mother   . Stomach cancer Mother   . Colon polyps Mother   . Arthritis Father   . Hyperlipidemia Father   . Heart disease Father   . Kidney disease Father   . Breast cancer Sister   . Hypertension Sister   . Ovarian cancer  Sister   . Gallbladder disease Sister        x 6  . Colon cancer Neg Hx   . Liver cancer Neg Hx   . Rectal cancer Neg Hx   . Esophageal cancer Neg Hx     BP 114/82   Pulse 62   Ht 5\' 9"  (1.753 m)   Wt 218 lb 12.8 oz (99.2 kg)   SpO2 97%   BMI 32.31 kg/m    Review of Systems Denies sob and falls.      Objective:   Physical Exam VITAL SIGNS:  See vs page GENERAL: no distress.  Ext: no leg edema.    Lab Results  Component Value Date   CREATININE 0.97 12/11/2017   BUN 22 12/11/2017   NA 139 12/11/2017   K 3.6 12/11/2017   CL 108 12/11/2017   CO2 27 12/11/2017   25-OH-vit-D=32 Lab Results  Component Value Date   PTH 114 (H) 08/27/2018   CALCIUM 10.6 (H) 08/27/2018      Assessment & Plan:  Primary hyperparathyroidism.  No indication for surgery.  We'll follow Vit-D def: well-replaced.   Patient Instructions  blood tests are requested for you today.  We'll let you know about the results. Please come back for a follow-up appointment in 1 year.

## 2018-08-28 LAB — PTH, INTACT AND CALCIUM
Calcium: 10.6 mg/dL — ABNORMAL HIGH (ref 8.6–10.3)
PTH: 114 pg/mL — ABNORMAL HIGH (ref 14–64)

## 2018-09-19 ENCOUNTER — Encounter: Payer: Self-pay | Admitting: Internal Medicine

## 2018-09-29 ENCOUNTER — Encounter: Payer: Self-pay | Admitting: Internal Medicine

## 2018-10-01 ENCOUNTER — Ambulatory Visit (INDEPENDENT_AMBULATORY_CARE_PROVIDER_SITE_OTHER): Payer: Medicare Other | Admitting: Internal Medicine

## 2018-10-01 ENCOUNTER — Encounter: Payer: Self-pay | Admitting: Internal Medicine

## 2018-10-01 DIAGNOSIS — R059 Cough, unspecified: Secondary | ICD-10-CM | POA: Insufficient documentation

## 2018-10-01 DIAGNOSIS — R05 Cough: Secondary | ICD-10-CM | POA: Diagnosis not present

## 2018-10-01 MED ORDER — DOXYCYCLINE HYCLATE 100 MG PO TABS: 100.0000 mg | ORAL_TABLET | Freq: Two times a day (BID) | ORAL | 0 refills | Status: DC

## 2018-10-01 MED ORDER — BENZONATATE 200 MG PO CAPS: 200.0000 mg | ORAL_CAPSULE | Freq: Three times a day (TID) | ORAL | 0 refills | Status: DC | PRN

## 2018-10-01 NOTE — Assessment & Plan Note (Signed)
Allergies appear well controlled on exam, due to course will rx doxycycline and tessalon perles for cough. Continue zyrtec and flonase.

## 2018-10-01 NOTE — Patient Instructions (Signed)
We have sent in the cough medicine tessalon perles to use up to 3 times a day as needed.   We have sent in doxycycline to take 1 pill twice a day for 1 week.

## 2018-10-01 NOTE — Progress Notes (Signed)
   Subjective:    Patient ID: Hector [name], male    DOB: 07/05/1951, 67 y.o.   MRN: [id number]  HPI The patient is a 67 YO man coming in for cough. Started about 2 weeks ago. Mostly non-productive. Denies nasal drainage or sinus pressure. No ear pain or pressure. Some SOB and fatigue. Denies fevers or chills. Denies muscle aches or sick contacts. Has taken otc cold medicine and mucinex over the counter without improvement. Overall symptoms are slightly worsening with time. Is taking zyrtec and flonase with good relief of seasonal allergy symptoms.   Review of Systems  Constitutional: Positive for activity change and fatigue. Negative for appetite change, chills, fever and unexpected weight change.  HENT: Negative for congestion, ear discharge, ear pain, postnasal drip, rhinorrhea, sinus pressure, sinus pain, sneezing, sore throat, tinnitus, trouble swallowing and voice change.   Eyes: Negative.   Respiratory: Positive for cough, shortness of breath and wheezing. Negative for chest tightness.   Cardiovascular: Negative.   Gastrointestinal: Negative.   Musculoskeletal: Negative.   Neurological: Negative.       Objective:   Physical Exam  Constitutional: He is oriented to person, place, and time. He appears well-developed and well-nourished.  HENT:  Head: Normocephalic and atraumatic.  Eyes: EOM are normal.  Neck: Normal range of motion.  Cardiovascular: Normal rate and regular rhythm.  Pulmonary/Chest: Effort normal. No respiratory distress. He has no wheezes. He has no rales.  Some rhonchi bilaterally which partially clear with coughing  Musculoskeletal: He exhibits no edema.  Neurological: He is alert and oriented to person, place, and time. Coordination normal.  Skin: Skin is warm and dry.   Vitals:   10/01/18 1040  BP: (!) 142/90  Pulse: 60  Temp: 98 F (36.7 C)  TempSrc: Oral  SpO2: 99%  Weight: 221 lb (100.2 kg)  Height: 5\' 9"  (1.753 m)      Assessment & Plan:

## 2018-10-05 ENCOUNTER — Encounter: Payer: Self-pay | Admitting: Family Medicine

## 2018-10-08 ENCOUNTER — Encounter: Payer: Self-pay | Admitting: Family Medicine

## 2018-10-08 ENCOUNTER — Ambulatory Visit (INDEPENDENT_AMBULATORY_CARE_PROVIDER_SITE_OTHER): Payer: Medicare Other

## 2018-10-08 ENCOUNTER — Ambulatory Visit (INDEPENDENT_AMBULATORY_CARE_PROVIDER_SITE_OTHER): Payer: Medicare Other | Admitting: Family Medicine

## 2018-10-08 VITALS — BP 124/98 | HR 70 | Temp 98.0°F | Ht 69.0 in | Wt 221.8 lb

## 2018-10-08 DIAGNOSIS — G8929 Other chronic pain: Secondary | ICD-10-CM

## 2018-10-08 DIAGNOSIS — M25511 Pain in right shoulder: Secondary | ICD-10-CM

## 2018-10-08 MED ORDER — PREDNISONE 5 MG PO TABS: ORAL_TABLET | ORAL | 0 refills | Status: DC

## 2018-10-08 NOTE — Patient Instructions (Signed)
Good to see you  I will call you with the results from today  Please try the medicine  Please continue the exercises  Please try heat or ice on the shoulder  I will call you with the MRI results after it has been completed.

## 2018-10-08 NOTE — Progress Notes (Signed)
Hector [name] - 67 y.o. male MRN [id number]  Date of birth: 08-02-1951  SUBJECTIVE:  Including CC & ROS.  Chief Complaint  Patient presents with  . Shoulder Pain    pt c/o right should pain for x1 year. pt sts pain feels like a pulled muscle, pt takes tylenol & meloxicam for pain     Hector [name] is a 67 y.o. male that is presenting with a follow-up on his right shoulder pain.  He received a intra-articular steroid injection and had some improvement of his pain.  His pain did not completely go away.  His pain is been occurring for about 8 months now.  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  He is unsure if his arm was pulled at that time but he feels his pain started after that incident.  The pain occurs mainly in the lateral aspect of the shoulder.  The pain is worse with lying on the affected side.  Has tried naproxen but this interferes with his other medications.  Has pain with abduction and certain movements.  Denies any pain in flexion.    Review of Systems  Constitutional: Negative for fever.  HENT: Negative for congestion.   Respiratory: Negative for cough.   Cardiovascular: Negative for chest pain.  Gastrointestinal: Negative for abdominal pain.  Musculoskeletal: Negative for joint swelling.  Skin: Negative for color change.  Neurological: Negative for weakness.  Hematological: Negative for adenopathy.  Psychiatric/Behavioral: Negative for agitation.    HISTORY: Past Medical, Surgical, Social, and Family History Reviewed & Updated per EMR.   Pertinent Historical Findings include:  Past Medical History:  Diagnosis Date  . Adhesive capsulitis 07/31/2018  . Allergy   . Arthritis   . Chronic kidney disease    kidney stones  . GERD (gastroesophageal reflux disease)   . Hyperlipidemia   . Hypertension   . Tuberculosis     Past Surgical History:  Procedure Laterality Date  . COLONOSCOPY    . POLYPECTOMY    . PROSTATE SURGERY  12/2012   no  cancer    Allergies  Allergen Reactions  . Shellfish Allergy Hives    Family History  Problem Relation Age of Onset  . Arthritis Mother   . Hyperlipidemia Mother   . Heart disease Mother   . Stomach cancer Mother   . Colon polyps Mother   . Arthritis Father   . Hyperlipidemia Father   . Heart disease Father   . Kidney disease Father   . Breast cancer Sister   . Hypertension Sister   . Ovarian cancer Sister   . Gallbladder disease Sister        x 6  . Colon cancer Neg Hx   . Liver cancer Neg Hx   . Rectal cancer Neg Hx   . Esophageal cancer Neg Hx      Social History   Socioeconomic History  . Marital status: Married    Spouse name: Not on file  . Number of children: 2  . Years of education: Not on file  . Highest education level: Not on file  Occupational History  . Occupation: retired  Scientific laboratory technician  . Financial resource strain: Not on file  . Food insecurity:    Worry: Not on file    Inability: Not on file  . Transportation needs:    Medical: Not on file    Non-medical: Not on file  Tobacco Use  . Smoking status: Never Smoker  .  Smokeless tobacco: Never Used  Substance and Sexual Activity  . Alcohol use: No    Alcohol/week: 0.0 standard drinks  . Drug use: No  . Sexual activity: Not on file  Lifestyle  . Physical activity:    Days per week: Not on file    Minutes per session: Not on file  . Stress: Not on file  Relationships  . Social connections:    Talks on phone: Not on file    Gets together: Not on file    Attends religious service: Not on file    Active member of club or organization: Not on file    Attends meetings of clubs or organizations: Not on file    Relationship status: Not on file  . Intimate partner violence:    Fear of current or ex partner: Not on file    Emotionally abused: Not on file    Physically abused: Not on file    Forced sexual activity: Not on file  Other Topics Concern  . Not on file  Social History Narrative    . Not on file     PHYSICAL EXAM:  VS: BP (!) 124/98 (BP Location: Left Arm, Patient Position: Sitting, Cuff Size: Large)   Pulse 70   Temp 98 F (36.7 C) (Oral)   Ht 5\' 9"  (1.753 m)   Wt 221 lb 12.8 oz (100.6 kg)   SpO2 98%   BMI 32.75 kg/m  Physical Exam Gen: NAD, alert, cooperative with exam, well-appearing ENT: normal lips, normal nasal mucosa,  Eye: normal EOM, normal conjunctiva and lids CV:  no edema, +2 pedal pulses   Resp: no accessory muscle use, non-labored,  Skin: no rashes, no areas of induration  Neuro: normal tone, normal sensation to touch Psych:  normal insight, alert and oriented MSK:  Right shoulder. Normal active flexion and abduction. Pain with external rotation and abduction Normal external rotation. Pain with empty can testing. Pain with O'Brien's testing. Pain with speeds test. Normal grip strength. No signs of atrophy. Neurovascular intact     ASSESSMENT & PLAN:   Chronic right shoulder pain I have treated him for capsulitis but it appears that his range of motion is normal.  Possible that he could have a labral problem.  His pain is a stemming from an incident where he is up in a child with a seizure and he feels that his shoulder got pulled at that time.  This is about 8 months ago.  Has had minimal improvement with the steroid injection to this point. -X-ray today. -Prednisone. -MRI to evaluate for labral tear or rotator cuff tear

## 2018-10-08 NOTE — Assessment & Plan Note (Signed)
I have treated him for capsulitis but it appears that his range of motion is normal.  Possible that he could have a labral problem.  His pain is a stemming from an incident where he is up in a child with a seizure and he feels that his shoulder got pulled at that time.  This is about 8 months ago.  Has had minimal improvement with the steroid injection to this point. -X-ray today. -Prednisone. -MRI to evaluate for labral tear or rotator cuff tear

## 2018-10-13 ENCOUNTER — Encounter: Payer: Self-pay | Admitting: Internal Medicine

## 2018-10-13 DIAGNOSIS — R059 Cough, unspecified: Secondary | ICD-10-CM

## 2018-10-13 DIAGNOSIS — R05 Cough: Secondary | ICD-10-CM

## 2018-10-15 ENCOUNTER — Ambulatory Visit
Admission: RE | Admit: 2018-10-15 | Discharge: 2018-10-15 | Disposition: A | Discharge status: Home / Self Care | Payer: Medicare Other | Source: Ambulatory Visit | Attending: Family Medicine | Admitting: Family Medicine

## 2018-10-15 DIAGNOSIS — M25511 Pain in right shoulder: Principal | ICD-10-CM

## 2018-10-15 DIAGNOSIS — M75120 Complete rotator cuff tear or rupture of unspecified shoulder, not specified as traumatic: Secondary | ICD-10-CM | POA: Diagnosis not present

## 2018-10-15 DIAGNOSIS — G8929 Other chronic pain: Secondary | ICD-10-CM

## 2018-10-16 ENCOUNTER — Encounter: Payer: Self-pay | Admitting: Family Medicine

## 2018-10-16 DIAGNOSIS — M25511 Pain in right shoulder: Principal | ICD-10-CM

## 2018-10-16 DIAGNOSIS — G8929 Other chronic pain: Secondary | ICD-10-CM

## 2018-10-17 ENCOUNTER — Telehealth: Payer: Self-pay | Admitting: Family Medicine

## 2018-10-17 NOTE — Telephone Encounter (Signed)
Left VM for patient. If he calls back please have him speak with a nurse/CMA and inform that his MRi shows that he has a rotator cuff tear and a labral tear. I could make a referral to a surgeon if he would like to discuss if surgery would help his pain. The [facility] can report results to patient.   If any questions then please take the best time and phone number to call and I will try to call him back.   Rosemarie Ax, MD [facility] 10/17/2018, 4:46 PM

## 2018-10-25 DIAGNOSIS — M75121 Complete rotator cuff tear or rupture of right shoulder, not specified as traumatic: Secondary | ICD-10-CM | POA: Diagnosis not present

## 2018-10-29 DIAGNOSIS — M7541 Impingement syndrome of right shoulder: Secondary | ICD-10-CM | POA: Diagnosis not present

## 2018-10-29 DIAGNOSIS — S46011A Strain of muscle(s) and tendon(s) of the rotator cuff of right shoulder, initial encounter: Secondary | ICD-10-CM | POA: Diagnosis not present

## 2018-10-29 DIAGNOSIS — G8918 Other acute postprocedural pain: Secondary | ICD-10-CM | POA: Diagnosis not present

## 2018-10-29 DIAGNOSIS — S46011S Strain of muscle(s) and tendon(s) of the rotator cuff of right shoulder, sequela: Secondary | ICD-10-CM | POA: Diagnosis not present

## 2018-11-04 ENCOUNTER — Encounter: Payer: Self-pay | Admitting: Internal Medicine

## 2018-11-06 DIAGNOSIS — Z9889 Other specified postprocedural states: Secondary | ICD-10-CM | POA: Diagnosis not present

## 2018-11-12 ENCOUNTER — Other Ambulatory Visit: Payer: Self-pay | Admitting: Internal Medicine

## 2018-11-15 DIAGNOSIS — S46001D Unspecified injury of muscle(s) and tendon(s) of the rotator cuff of right shoulder, subsequent encounter: Secondary | ICD-10-CM | POA: Diagnosis not present

## 2018-11-26 DIAGNOSIS — S46001D Unspecified injury of muscle(s) and tendon(s) of the rotator cuff of right shoulder, subsequent encounter: Secondary | ICD-10-CM | POA: Diagnosis not present

## 2018-11-29 DIAGNOSIS — S46001D Unspecified injury of muscle(s) and tendon(s) of the rotator cuff of right shoulder, subsequent encounter: Secondary | ICD-10-CM | POA: Diagnosis not present

## 2018-12-02 DIAGNOSIS — S46001D Unspecified injury of muscle(s) and tendon(s) of the rotator cuff of right shoulder, subsequent encounter: Secondary | ICD-10-CM | POA: Diagnosis not present

## 2018-12-06 DIAGNOSIS — S46001D Unspecified injury of muscle(s) and tendon(s) of the rotator cuff of right shoulder, subsequent encounter: Secondary | ICD-10-CM | POA: Diagnosis not present

## 2018-12-09 DIAGNOSIS — S46001D Unspecified injury of muscle(s) and tendon(s) of the rotator cuff of right shoulder, subsequent encounter: Secondary | ICD-10-CM | POA: Diagnosis not present

## 2018-12-11 DIAGNOSIS — Z9889 Other specified postprocedural states: Secondary | ICD-10-CM | POA: Diagnosis not present

## 2018-12-11 DIAGNOSIS — S46001D Unspecified injury of muscle(s) and tendon(s) of the rotator cuff of right shoulder, subsequent encounter: Secondary | ICD-10-CM | POA: Diagnosis not present

## 2018-12-18 DIAGNOSIS — S46001D Unspecified injury of muscle(s) and tendon(s) of the rotator cuff of right shoulder, subsequent encounter: Secondary | ICD-10-CM | POA: Diagnosis not present

## 2018-12-20 DIAGNOSIS — S46001D Unspecified injury of muscle(s) and tendon(s) of the rotator cuff of right shoulder, subsequent encounter: Secondary | ICD-10-CM | POA: Diagnosis not present

## 2018-12-25 DIAGNOSIS — S46001D Unspecified injury of muscle(s) and tendon(s) of the rotator cuff of right shoulder, subsequent encounter: Secondary | ICD-10-CM | POA: Diagnosis not present

## 2018-12-27 DIAGNOSIS — S46001D Unspecified injury of muscle(s) and tendon(s) of the rotator cuff of right shoulder, subsequent encounter: Secondary | ICD-10-CM | POA: Diagnosis not present

## 2018-12-30 DIAGNOSIS — S46001D Unspecified injury of muscle(s) and tendon(s) of the rotator cuff of right shoulder, subsequent encounter: Secondary | ICD-10-CM | POA: Diagnosis not present

## 2019-01-01 DIAGNOSIS — S46001D Unspecified injury of muscle(s) and tendon(s) of the rotator cuff of right shoulder, subsequent encounter: Secondary | ICD-10-CM | POA: Diagnosis not present

## 2019-01-06 DIAGNOSIS — S46001D Unspecified injury of muscle(s) and tendon(s) of the rotator cuff of right shoulder, subsequent encounter: Secondary | ICD-10-CM | POA: Diagnosis not present

## 2019-01-13 DIAGNOSIS — S46001D Unspecified injury of muscle(s) and tendon(s) of the rotator cuff of right shoulder, subsequent encounter: Secondary | ICD-10-CM | POA: Diagnosis not present

## 2019-01-15 DIAGNOSIS — S46001D Unspecified injury of muscle(s) and tendon(s) of the rotator cuff of right shoulder, subsequent encounter: Secondary | ICD-10-CM | POA: Diagnosis not present

## 2019-01-22 DIAGNOSIS — S46001D Unspecified injury of muscle(s) and tendon(s) of the rotator cuff of right shoulder, subsequent encounter: Secondary | ICD-10-CM | POA: Diagnosis not present

## 2019-01-22 DIAGNOSIS — Z9889 Other specified postprocedural states: Secondary | ICD-10-CM | POA: Diagnosis not present

## 2019-02-08 ENCOUNTER — Encounter: Payer: Self-pay | Admitting: Internal Medicine

## 2019-02-10 ENCOUNTER — Ambulatory Visit (INDEPENDENT_AMBULATORY_CARE_PROVIDER_SITE_OTHER): Payer: Medicare Other | Admitting: Internal Medicine

## 2019-02-10 ENCOUNTER — Encounter: Payer: Self-pay | Admitting: Internal Medicine

## 2019-02-10 DIAGNOSIS — R6889 Other general symptoms and signs: Secondary | ICD-10-CM | POA: Diagnosis not present

## 2019-02-10 LAB — POC INFLUENZA A&B (BINAX/QUICKVUE)
Influenza A, POC: POSITIVE — AB
Influenza B, POC: NEGATIVE

## 2019-02-10 MED ORDER — OSELTAMIVIR PHOSPHATE 75 MG PO CAPS: 75.0000 mg | ORAL_CAPSULE | Freq: Two times a day (BID) | ORAL | 0 refills | Status: DC

## 2019-02-10 MED ORDER — HYDROCODONE-HOMATROPINE 5-1.5 MG/5ML PO SYRP: 5.0000 mL | ORAL_SOLUTION | Freq: Every evening | ORAL | 0 refills | Status: DC | PRN

## 2019-02-10 MED ORDER — BENZONATATE 200 MG PO CAPS: 200.0000 mg | ORAL_CAPSULE | Freq: Three times a day (TID) | ORAL | 0 refills | Status: DC | PRN

## 2019-02-10 NOTE — Assessment & Plan Note (Addendum)
Flu testing done as in the window for tamiflu. Rx for tamiflu for positive screening in office. Also rx for tessalon perles and hycodan for cough. Reviewed [place] narcotic database and no inappropriate fills. Counseled about supportive care.

## 2019-02-10 NOTE — Patient Instructions (Signed)
We have sent in tamiflu to take 1 pill twice a day for 5 days.   We have sent in cough medicine to use at night time.   We have also sent in tessalon perles to use up to 3 times per day for cough which is non-drowsy.   We have sent in tamiflu for your wife the preventative 1 pill daily for 1 week.

## 2019-02-10 NOTE — Progress Notes (Signed)
   Subjective:   Patient ID: Hector [name], male    DOB: July 22, 1951, 68 y.o.   MRN: [id number]  HPI The patient is a 68 y.o. man coming in for cold symptoms. Started 2 days ago. Main symptoms are: fever, headache, cough, dizziness. Denies SOB. Overall it is worsening gradually. He is not sleeping due to persistent coughing. Has tried mucinex, nyquil, tylenol. Still taking his zyrtec and flonase which he takes all the time.   Review of Systems  Constitutional: Positive for activity change, appetite change and chills. Negative for fatigue, fever and unexpected weight change.  HENT: Positive for congestion, postnasal drip, rhinorrhea and sinus pressure. Negative for ear discharge, ear pain, sinus pain, sneezing, sore throat, tinnitus, trouble swallowing and voice change.   Eyes: Negative.   Respiratory: Positive for cough. Negative for chest tightness, shortness of breath and wheezing.   Cardiovascular: Negative.   Gastrointestinal: Negative.   Musculoskeletal: Positive for myalgias.  Neurological: Positive for headaches.    Objective:  Physical Exam Constitutional:      Appearance: He is well-developed.  HENT:     Head: Normocephalic and atraumatic.     Comments: Oropharynx with redness and clear drainage, nose with swollen turbinates, TMs normal bilaterally.  Neck:     Musculoskeletal: Normal range of motion.     Thyroid: No thyromegaly.  Cardiovascular:     Rate and Rhythm: Normal rate and regular rhythm.  Pulmonary:     Effort: Pulmonary effort is normal. No respiratory distress.     Breath sounds: Normal breath sounds. No wheezing or rales.  Abdominal:     Palpations: Abdomen is soft.  Musculoskeletal:        General: Tenderness present.  Lymphadenopathy:     Cervical: No cervical adenopathy.  Skin:    General: Skin is warm and dry.  Neurological:     Mental Status: He is alert and oriented to person, place, and time.     Vitals:   02/10/19 0928  BP: 102/68  Pulse:  66  Temp: 99.1 F (37.3 C)  TempSrc: Oral  SpO2: 99%  Weight: 220 lb (99.8 kg)  Height: 5\' 9"  (1.753 m)    Assessment & Plan:

## 2019-02-19 ENCOUNTER — Encounter: Payer: Self-pay | Admitting: Internal Medicine

## 2019-02-20 MED ORDER — HYDROCODONE-HOMATROPINE 5-1.5 MG/5ML PO SYRP: 5.0000 mL | ORAL_SOLUTION | Freq: Two times a day (BID) | ORAL | 0 refills | Status: DC | PRN

## 2019-02-20 NOTE — Addendum Note (Signed)
Addended by: Pricilla Holm A on: 02/20/2019 10:12 AM   Modules accepted: Orders

## 2019-03-17 ENCOUNTER — Other Ambulatory Visit: Payer: Self-pay | Admitting: Internal Medicine

## 2019-04-06 ENCOUNTER — Encounter: Payer: Self-pay | Admitting: Internal Medicine

## 2019-05-09 ENCOUNTER — Encounter: Payer: Self-pay | Admitting: Internal Medicine

## 2019-05-12 ENCOUNTER — Telehealth: Payer: Self-pay

## 2019-05-12 NOTE — Telephone Encounter (Signed)
Copied from [place] [id number]. Topic: Appointment Scheduling - Scheduling Inquiry for Clinic >> May 12, 2019  9:44 AM Percell Belt A wrote: Reason for CRM: pt called in and needs an appt this week to get bp check.  He trying to have tooth pulled next week and Dentist is requiring him to be seen to make sure everything is ok before he pulls the tooth   Best number  [phone] >> May 12, 2019 10:18 AM [place], Isidoro Donning wrote: Faythe Ghee to schedule for an in office visit?

## 2019-05-12 NOTE — Telephone Encounter (Signed)
Appointment scheduled.

## 2019-05-12 NOTE — Telephone Encounter (Signed)
I told patient when he asked on mychart that as long as he was symptom free he could make an appointment but I think we do not have availability till Thursday

## 2019-05-15 ENCOUNTER — Encounter: Payer: Self-pay | Admitting: Internal Medicine

## 2019-05-15 ENCOUNTER — Other Ambulatory Visit: Payer: Self-pay

## 2019-05-15 ENCOUNTER — Ambulatory Visit (INDEPENDENT_AMBULATORY_CARE_PROVIDER_SITE_OTHER): Payer: Medicare Other | Admitting: Internal Medicine

## 2019-05-15 VITALS — BP 150/102 | HR 63 | Temp 98.4°F | Ht 69.0 in | Wt 221.0 lb

## 2019-05-15 DIAGNOSIS — I10 Essential (primary) hypertension: Secondary | ICD-10-CM

## 2019-05-15 NOTE — Patient Instructions (Signed)
There has been no change in the EKG since 5 years ago. Get the diet and exercise back on track to help the blood pressure.

## 2019-05-15 NOTE — Progress Notes (Signed)
   Subjective:   Patient ID: Hector [name], male    DOB: [birth year], 68 y.o.   MRN: [id number]  HPI The patient is a 68 YO man coming in for concerns about blood pressure. He was trying to get tooth extraction last week and BP was so high that the dentist refused to do this. He had drank 4 cups coffee and then ate [place] that day and feels that had to do with it. He is needing clearance to get the tooth pulled. He has had some negative dietary change since the pandemic started and has not been exercising. He has put on some weight and feels that this has negatively affected his BP. Denies headaches or chest pains or nausea.   Review of Systems  Constitutional: Negative.   HENT: Negative.   Eyes: Negative.   Respiratory: Negative for cough, chest tightness and shortness of breath.   Cardiovascular: Negative for chest pain, palpitations and leg swelling.  Gastrointestinal: Negative for abdominal distention, abdominal pain, constipation, diarrhea, nausea and vomiting.  Musculoskeletal: Negative.   Skin: Negative.   Neurological: Negative.   Psychiatric/Behavioral: Negative.     Objective:  Physical Exam Constitutional:      Appearance: He is well-developed.  HENT:     Head: Normocephalic and atraumatic.  Neck:     Musculoskeletal: Normal range of motion.  Cardiovascular:     Rate and Rhythm: Normal rate and regular rhythm.  Pulmonary:     Effort: Pulmonary effort is normal. No respiratory distress.     Breath sounds: Normal breath sounds. No wheezing or rales.  Abdominal:     General: Bowel sounds are normal. There is no distension.     Palpations: Abdomen is soft.     Tenderness: There is no abdominal tenderness. There is no rebound.  Skin:    General: Skin is warm and dry.  Neurological:     Mental Status: He is alert and oriented to person, place, and time.     Coordination: Coordination normal.     Vitals:   05/15/19 1118 05/15/19 1141  BP: (!) 152/100 (!) 150/102   Pulse: 63   Temp: 98.4 F (36.9 C)   TempSrc: Oral   SpO2: 97%   Weight: 221 lb (100.2 kg)   Height: 5\' 9"  (1.753 m)    EKG: Rate 57, sinus, axis normal, interval normal, poor r wave progression, no significant change when compared to 2015  Assessment & Plan:

## 2019-05-15 NOTE — Assessment & Plan Note (Signed)
Taking amlodipine, losartan, lasix. Will continue and work on diet and exercise. Prior BP is at goal during office visits within the last year. EKG done and no change from prior. No symptoms. He will make sure to take all BP meds prior to dentist visit and he has already started work on [employer].

## 2019-05-28 ENCOUNTER — Other Ambulatory Visit: Payer: Self-pay | Admitting: Internal Medicine

## 2019-06-20 ENCOUNTER — Ambulatory Visit (INDEPENDENT_AMBULATORY_CARE_PROVIDER_SITE_OTHER): Payer: Medicare Other | Admitting: *Deleted

## 2019-06-20 ENCOUNTER — Other Ambulatory Visit: Payer: Self-pay

## 2019-06-20 DIAGNOSIS — Z23 Encounter for immunization: Secondary | ICD-10-CM | POA: Diagnosis not present

## 2019-06-20 NOTE — Progress Notes (Signed)
PCP is put of the office today. Can you cosign for this tdap.Marland KitchenJohny Chess

## 2019-06-23 ENCOUNTER — Encounter: Payer: Self-pay | Admitting: Internal Medicine

## 2019-06-23 MED ORDER — LOSARTAN POTASSIUM 100 MG PO TABS: 100.0000 mg | ORAL_TABLET | Freq: Every day | ORAL | 0 refills | Status: DC

## 2019-06-25 ENCOUNTER — Encounter: Payer: Self-pay | Admitting: Internal Medicine

## 2019-06-26 ENCOUNTER — Telehealth: Payer: Self-pay | Admitting: Internal Medicine

## 2019-06-26 NOTE — Telephone Encounter (Signed)
error 

## 2019-06-30 ENCOUNTER — Other Ambulatory Visit: Payer: Self-pay

## 2019-06-30 MED ORDER — LOSARTAN POTASSIUM 100 MG PO TABS: 100.0000 mg | ORAL_TABLET | Freq: Every day | ORAL | 1 refills | Status: DC

## 2019-08-13 IMAGING — MR MR SHOULDER*R* W/O CM
4 of 5 series · 21 of 40 positions shown · non-contrast
Comparison: None.

CLINICAL DATA: Right shoulder pain. No known injury. No prior
surgery.

EXAM:
MRI OF THE RIGHT SHOULDER WITHOUT CONTRAST
TECHNIQUE: Multiplanar, multisequence MR imaging of the shoulder was performed.
No intravenous contrast was administered.

[Series 6: PD fat-sat · axial · right · 4.0mm · 0.44mm/px · z∈[-20,+77]mm · 8 of 22 slices shown (1 of 2)]
[im 1/22]
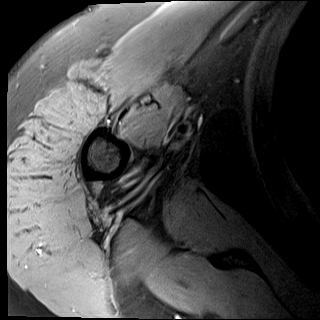
[im 4/22]
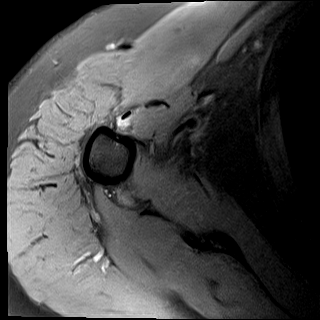
[im 7/22]
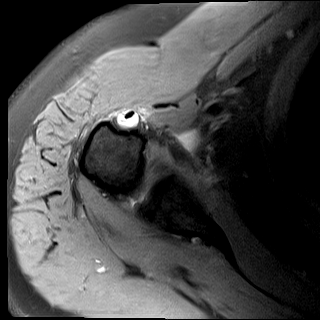
[im 10/22]
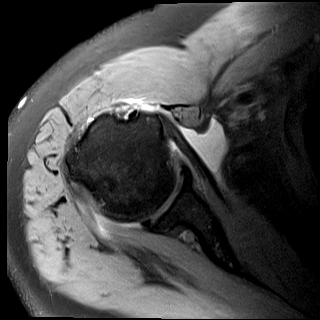
[im 13/22]
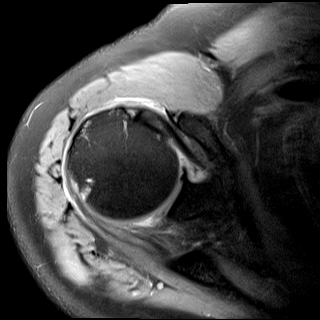
[im 16/22]
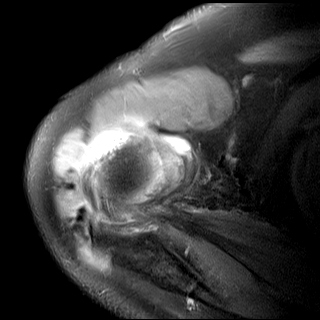
[im 19/22]
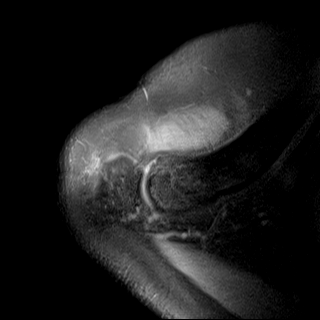
[im 22/22]
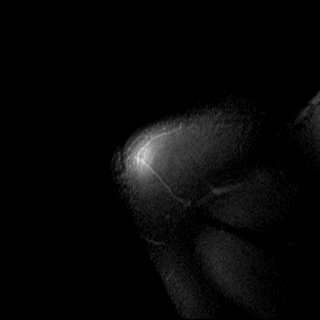

[Series 7: T2 fat-sat · oblique · right · 4.0mm · 0.44mm/px · 3 of 23 slices shown (1 of 2)]
[im 4/23]
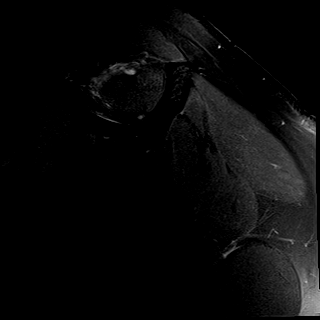
[im 13/23]
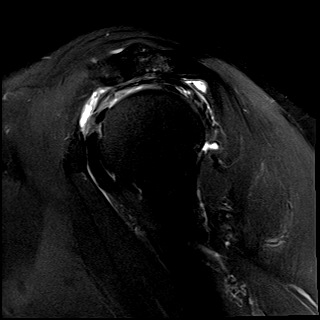
[im 19/23]
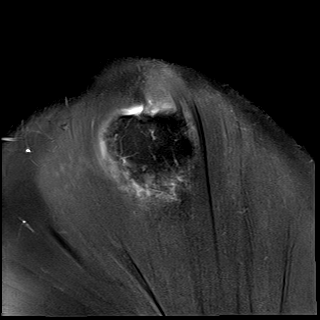

[Series 8: T2 fat-sat · oblique · right · 4.0mm · 0.22mm/px · 3 of 21 slices shown (2 of 2)]
[im 3/21]
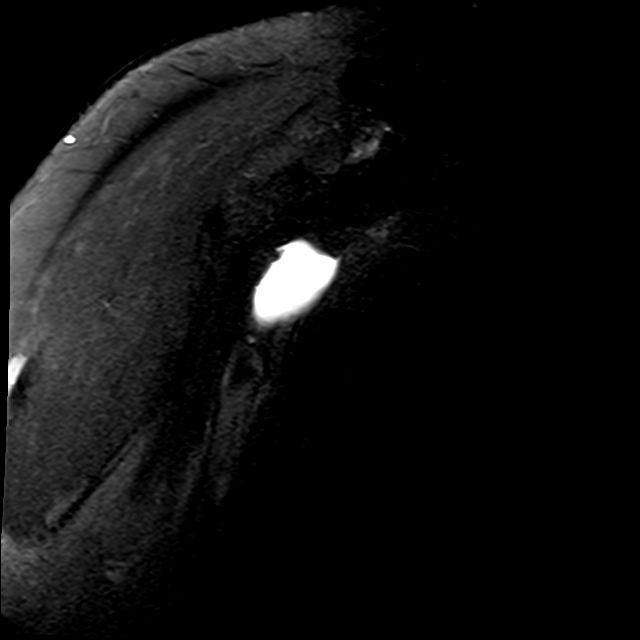
[im 12/21]
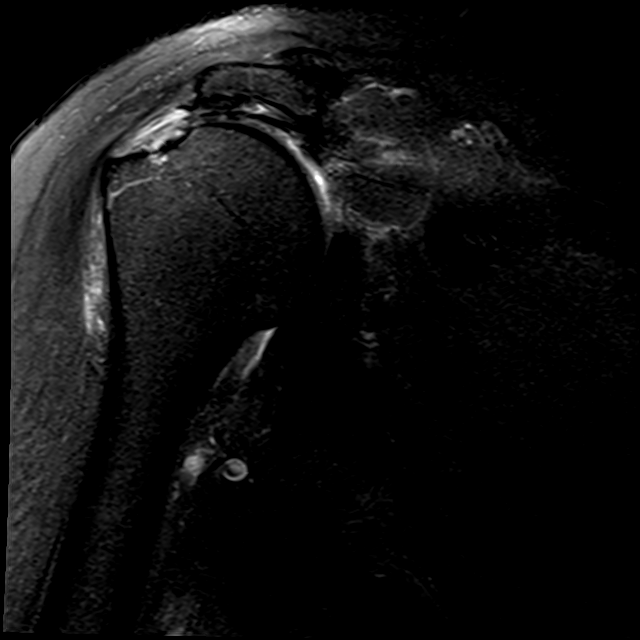
[im 18/21]
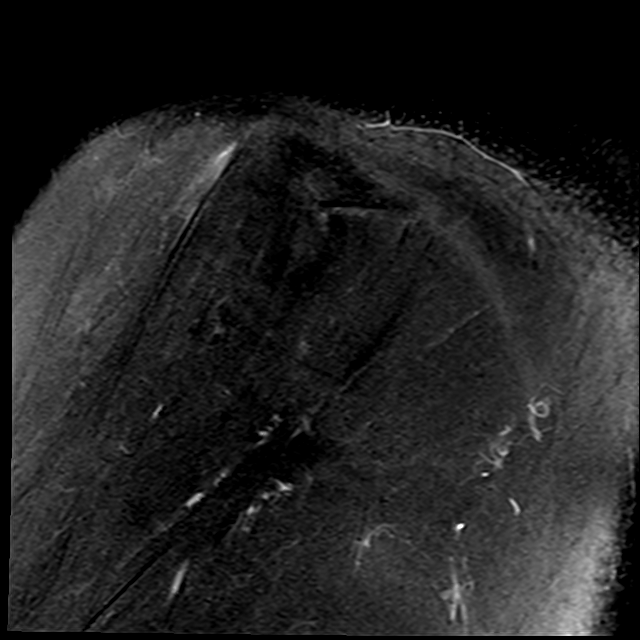

[Series 10: PD fat-sat · oblique · right · 4.0mm · 0.22mm/px · 7 of 21 slices shown (2 of 2)]
[im 1/21]
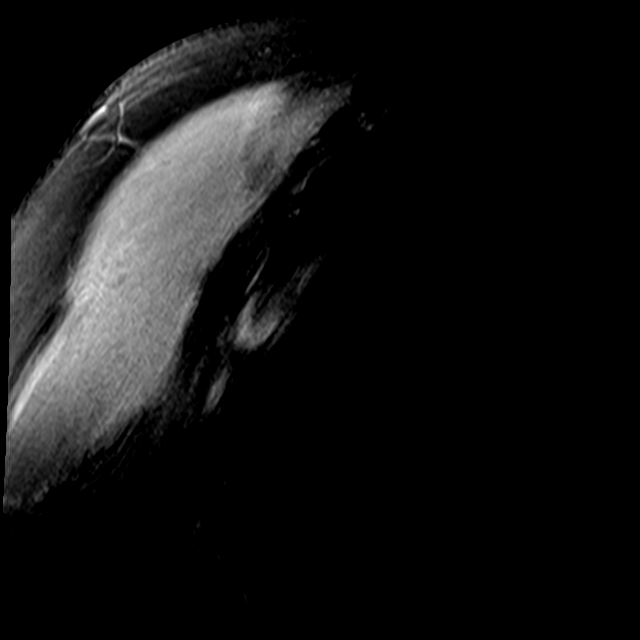
[im 3/21]
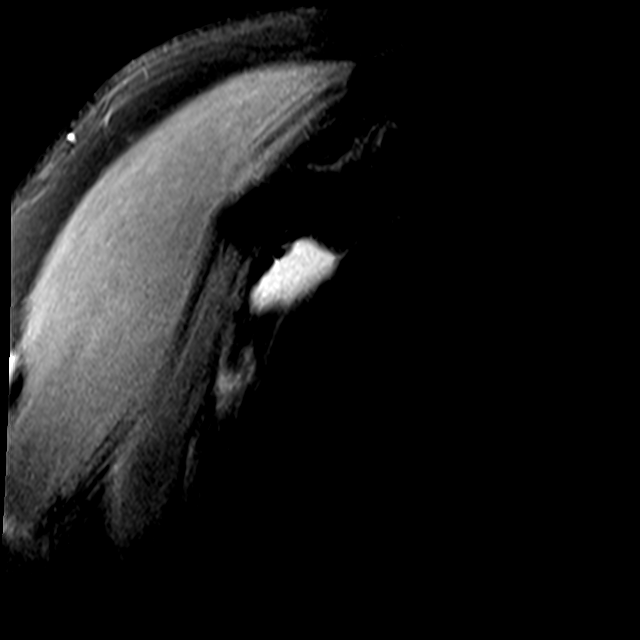
[im 6/21]
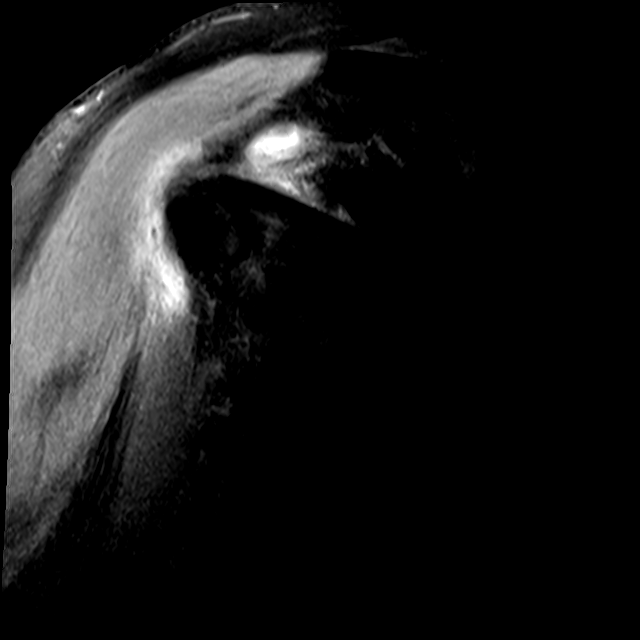
[im 9/21]
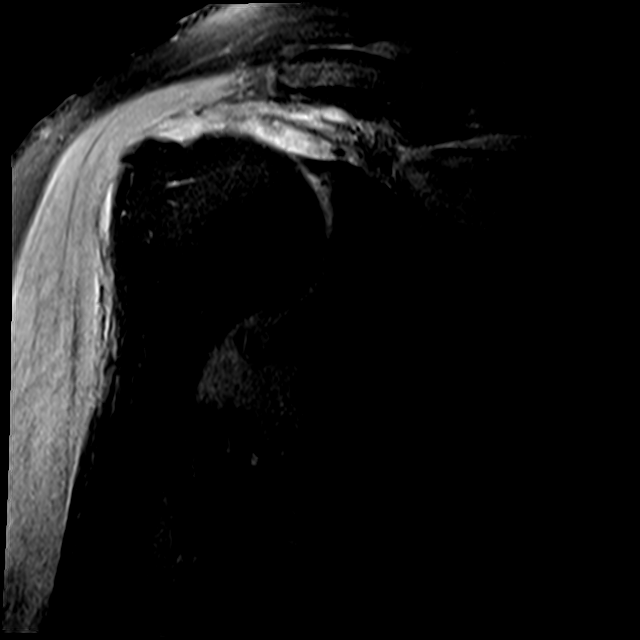
[im 12/21]
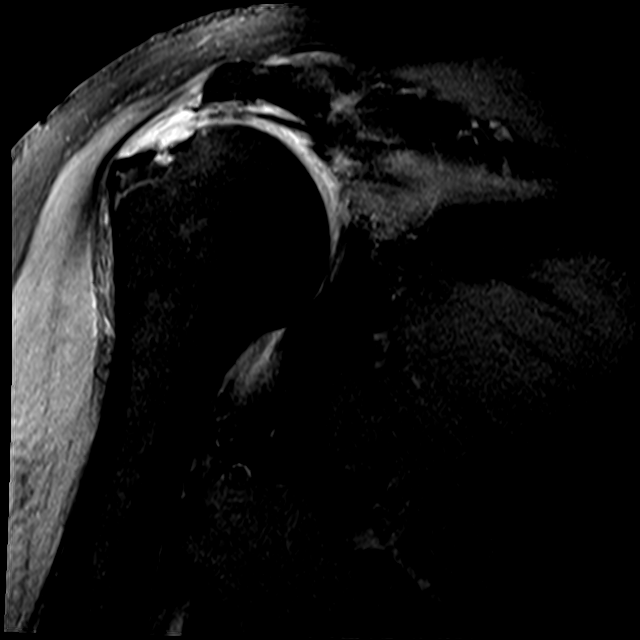
[im 15/21]
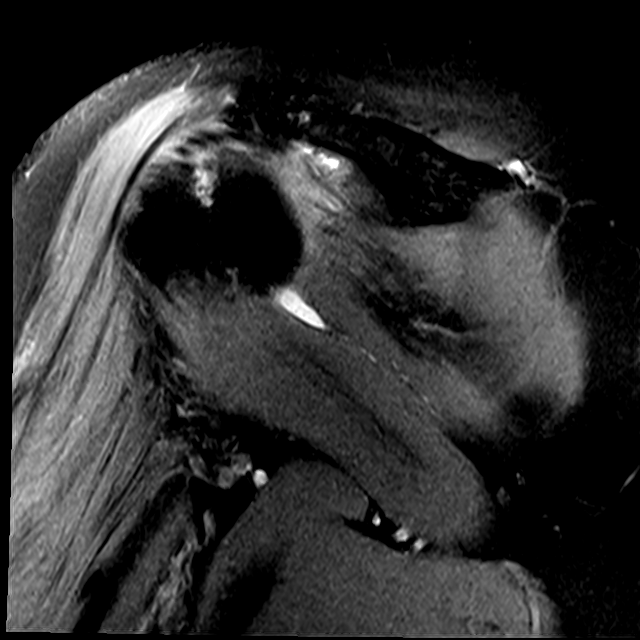
[im 18/21]
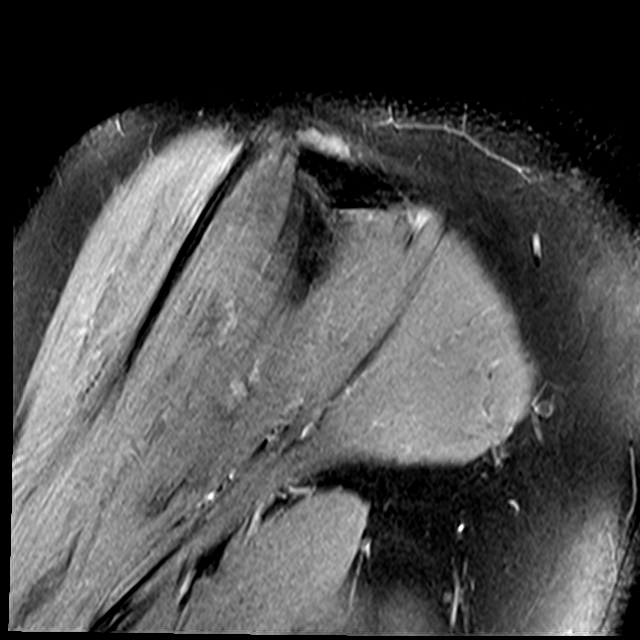

[21 of 40 positions shown; findings below may reference images not displayed]

FINDINGS: Rotator cuff: Severe tendinosis of the supraspinatus tendon with a
large full-thickness, near complete, tear of the supraspinatus
tendon with a few intact fibers and 17 mm of retraction. Moderate
tendinosis of the infraspinatus tendon. Teres minor tendon is
intact. Mild tendinosis of the subscapularis tendon.

Muscles: No atrophy or fatty replacement of nor abnormal signal
within, the muscles of the rotator cuff.

Biceps long head:  Intact.

Acromioclavicular Joint: Incidental note made of an os acromiale.
Mild arthropathy of the acromioclavicular joint. Type II acromion.
Small amount of subacromial/subdeltoid bursal fluid.

Glenohumeral Joint: Small joint effusion.  No chondral defect.

Labrum:  Superior posterior labral tear.

Bones:  No acute osseous abnormality.  No aggressive osseous lesion.

Other: No fluid collection or hematoma.
IMPRESSION: 1. Severe tendinosis of the supraspinatus tendon with a large
full-thickness, near complete, tear of the supraspinatus tendon with
a few intact fibers and 17 mm of retraction.
2. Moderate tendinosis of the infraspinatus tendon.
3. Mild tendinosis of the subscapularis tendon.

## 2019-08-21 ENCOUNTER — Ambulatory Visit (INDEPENDENT_AMBULATORY_CARE_PROVIDER_SITE_OTHER): Payer: Medicare Other

## 2019-08-21 ENCOUNTER — Other Ambulatory Visit: Payer: Self-pay

## 2019-08-21 DIAGNOSIS — Z23 Encounter for immunization: Secondary | ICD-10-CM

## 2019-08-28 ENCOUNTER — Other Ambulatory Visit: Payer: Self-pay

## 2019-08-29 ENCOUNTER — Ambulatory Visit: Payer: Medicare Other | Admitting: Endocrinology

## 2019-09-01 ENCOUNTER — Ambulatory Visit (INDEPENDENT_AMBULATORY_CARE_PROVIDER_SITE_OTHER): Payer: Medicare Other | Admitting: Endocrinology

## 2019-09-01 ENCOUNTER — Other Ambulatory Visit: Payer: Self-pay

## 2019-09-01 ENCOUNTER — Encounter: Payer: Self-pay | Admitting: Endocrinology

## 2019-09-01 VITALS — BP 164/98 | HR 69 | Ht 69.0 in | Wt 220.8 lb

## 2019-09-01 DIAGNOSIS — R609 Edema, unspecified: Secondary | ICD-10-CM

## 2019-09-01 DIAGNOSIS — I10 Essential (primary) hypertension: Secondary | ICD-10-CM | POA: Diagnosis not present

## 2019-09-01 DIAGNOSIS — E213 Hyperparathyroidism, unspecified: Secondary | ICD-10-CM

## 2019-09-01 LAB — BASIC METABOLIC PANEL
BUN: 17 mg/dL (ref 6–23)
CO2: 29 mEq/L (ref 19–32)
Calcium: 11.1 mg/dL — ABNORMAL HIGH (ref 8.4–10.5)
Chloride: 108 mEq/L (ref 96–112)
Creatinine, Ser: 1.07 mg/dL (ref 0.40–1.50)
GFR: 83.49 mL/min (ref >60.00)
Glucose, Bld: 101 mg/dL — ABNORMAL HIGH (ref 70–99)
Potassium: 3.9 mEq/L (ref 3.5–5.1)
Sodium: 143 mEq/L (ref 135–145)

## 2019-09-01 LAB — VITAMIN D 25 HYDROXY (VIT D DEFICIENCY, FRACTURES): VITD: 34.54 ng/mL (ref 30.00–100.00)

## 2019-09-01 NOTE — Progress Notes (Signed)
Subjective:    Patient ID: Hector [name], male    DOB: Mar 31, 1951, 68 y.o.   MRN: [id number]  HPI  The state of at least three ongoing medical problems is addressed today, with interval history of each noted here:  Pt returns for f/u of primary hyperparathyroidism (dx'ed 2013; HCTZ was changed to lasix; DEXA was normal in 2016 and 2018; he had episode of urolithiasis in 2018).  No change in chronic arthralgias.   Vit-D deficiency (he has had fxs of left little finger, coccyx, and left great toe--all with significant injuries): he denies leg cramps.  He still takes vit-D, 2000 units/d.   HTN: denies sob.    Past Medical History:  Diagnosis Date  . Adhesive capsulitis 07/31/2018  . Allergy   . Arthritis   . Chronic kidney disease    kidney stones  . GERD (gastroesophageal reflux disease)   . Hyperlipidemia   . Hypertension   . Tuberculosis     Past Surgical History:  Procedure Laterality Date  . COLONOSCOPY    . POLYPECTOMY    . PROSTATE SURGERY  12/2012   no cancer  . ROTATOR CUFF REPAIR Right 2019    Social History   Socioeconomic History  . Marital status: Married    Spouse name: Not on file  . Number of children: 2  . Years of education: Not on file  . Highest education level: Not on file  Occupational History  . Occupation: retired  Scientific laboratory technician  . Financial resource strain: Not on file  . Food insecurity    Worry: Not on file    Inability: Not on file  . Transportation needs    Medical: Not on file    Non-medical: Not on file  Tobacco Use  . Smoking status: Never Smoker  . Smokeless tobacco: Never Used  Substance and Sexual Activity  . Alcohol use: No    Alcohol/week: 0.0 standard drinks  . Drug use: No  . Sexual activity: Not on file  Lifestyle  . Physical activity    Days per week: Not on file    Minutes per session: Not on file  . Stress: Not on file  Relationships  . Social Herbalist on phone: Not on file    Gets together: Not on  file    Attends religious service: Not on file    Active member of club or organization: Not on file    Attends meetings of clubs or organizations: Not on file    Relationship status: Not on file  . Intimate partner violence    Fear of current or ex partner: Not on file    Emotionally abused: Not on file    Physically abused: Not on file    Forced sexual activity: Not on file  Other Topics Concern  . Not on file  Social History Narrative  . Not on file    Current Outpatient Medications on File Prior to Visit  Medication Sig Dispense Refill  . amLODipine (NORVASC) 2.5 MG tablet Take 1 tablet (2.5 mg total) by mouth daily. 90 tablet 3  . aspirin 81 MG tablet Take 81 mg by mouth daily.    . cetirizine (ZYRTEC) 10 MG tablet Take 10 mg by mouth daily.    . fluticasone (FLONASE) 50 MCG/ACT nasal spray USE 2 SPRAYS IN EACH NOSTRIL DAILY 48 g 4  . losartan (COZAAR) 100 MG tablet Take 1 tablet (100 mg total) by mouth daily. 90 tablet 1  .  lovastatin (MEVACOR) 40 MG tablet Take 0.5 tablets (20 mg total) by mouth at bedtime. 45 tablet 3  . meloxicam (MOBIC) 15 MG tablet Take 15 mg by mouth daily.    . pantoprazole (PROTONIX) 40 MG tablet Take 1 tablet (40 mg total) by mouth daily. 90 tablet 3  . tamsulosin (FLOMAX) 0.4 MG CAPS capsule Take 1 capsule (0.4 mg total) by mouth daily. 10 capsule 0  . terazosin (HYTRIN) 10 MG capsule Take 10 mg by mouth at bedtime.    . triamcinolone cream (KENALOG) 0.1 % APPLY TOPICALLY TWICE A DAY 80 g 9   No current facility-administered medications on file prior to visit.     Allergies  Allergen Reactions  . Shellfish Allergy Hives    Family History  Problem Relation Age of Onset  . Arthritis Mother   . Hyperlipidemia Mother   . Heart disease Mother   . Stomach cancer Mother   . Colon polyps Mother   . Arthritis Father   . Hyperlipidemia Father   . Heart disease Father   . Kidney disease Father   . Breast cancer Sister   . Hypertension Sister    . Ovarian cancer Sister   . Gallbladder disease Sister        x 6  . Colon cancer Neg Hx   . Liver cancer Neg Hx   . Rectal cancer Neg Hx   . Esophageal cancer Neg Hx     BP (!) 164/98 (BP Location: Left Arm, Patient Position: Sitting, Cuff Size: Normal)   Pulse 69   Ht 5\' 9"  (1.753 m)   Wt 220 lb 12.8 oz (100.2 kg)   SpO2 96%   BMI 32.61 kg/m    Review of Systems Denies numbness and ankle edema.      Objective:   Physical Exam VITAL SIGNS:  See vs page GENERAL: no distress.   GAIT: normal and steady.  Ext: no leg edema.    Lab Results  Component Value Date   PTH 73 (H) 09/01/2019   CALCIUM 10.7 (H) 09/01/2019   CALCIUM 11.1 (H) 09/01/2019   25-OH-vit-D=37    Assessment & Plan:  Hyperparathyroidism, improved.  We discussed.  He declines ref surgery.   Vit-D def: well-replaced.  Edema and HTN: he needs increased rx of both.  I have sent a prescription to your pharmacy, to increase the Lasix Please come back for a follow-up appointment in 1 month. Recheck BMD   Patient Instructions  Blood tests are requested for you today.  We'll let you know about the results.  Bas on the results, we'll increase the furosemide if we can.   Let's recheck the bone density.   Please come back for a follow-up appointment in 6 months.

## 2019-09-01 NOTE — Patient Instructions (Addendum)
Blood tests are requested for you today.  We'll let you know about the results.  Bas on the results, we'll increase the furosemide if we can.   Let's recheck the bone density.   Please come back for a follow-up appointment in 6 months.

## 2019-09-02 LAB — PTH, INTACT AND CALCIUM
Calcium: 10.7 mg/dL — ABNORMAL HIGH (ref 8.6–10.3)
PTH: 73 pg/mL — ABNORMAL HIGH (ref 14–64)

## 2019-09-02 MED ORDER — FUROSEMIDE 40 MG PO TABS: 40.0000 mg | ORAL_TABLET | Freq: Every day | ORAL | 3 refills | Status: DC

## 2019-09-08 ENCOUNTER — Encounter: Payer: Self-pay | Admitting: Endocrinology

## 2019-09-09 ENCOUNTER — Other Ambulatory Visit: Payer: Self-pay

## 2019-09-09 ENCOUNTER — Inpatient Hospital Stay: Admission: RE | Admit: 2019-09-09 | Payer: Medicare Other | Source: Ambulatory Visit

## 2019-09-09 ENCOUNTER — Ambulatory Visit (INDEPENDENT_AMBULATORY_CARE_PROVIDER_SITE_OTHER)
Admission: RE | Admit: 2019-09-09 | Discharge: 2019-09-09 | Disposition: A | Discharge status: Home / Self Care | Payer: Medicare Other | Source: Ambulatory Visit | Attending: Endocrinology | Admitting: Endocrinology

## 2019-09-09 DIAGNOSIS — E213 Hyperparathyroidism, unspecified: Secondary | ICD-10-CM

## 2019-09-09 NOTE — Telephone Encounter (Signed)
Please advise 

## 2019-12-02 ENCOUNTER — Other Ambulatory Visit: Payer: Self-pay | Admitting: Internal Medicine

## 2020-03-02 ENCOUNTER — Ambulatory Visit: Payer: Medicare Other | Admitting: Endocrinology

## 2020-03-04 DIAGNOSIS — H43812 Vitreous degeneration, left eye: Secondary | ICD-10-CM | POA: Diagnosis not present

## 2020-03-04 DIAGNOSIS — H2513 Age-related nuclear cataract, bilateral: Secondary | ICD-10-CM | POA: Diagnosis not present

## 2020-03-04 DIAGNOSIS — H25013 Cortical age-related cataract, bilateral: Secondary | ICD-10-CM | POA: Diagnosis not present

## 2020-03-04 DIAGNOSIS — H40013 Open angle with borderline findings, low risk, bilateral: Secondary | ICD-10-CM | POA: Diagnosis not present

## 2020-03-04 LAB — HM DIABETES EYE EXAM

## 2020-03-09 ENCOUNTER — Ambulatory Visit: Payer: Medicare Other | Admitting: Endocrinology

## 2020-03-12 ENCOUNTER — Other Ambulatory Visit: Payer: Self-pay

## 2020-03-16 ENCOUNTER — Ambulatory Visit (INDEPENDENT_AMBULATORY_CARE_PROVIDER_SITE_OTHER): Payer: Medicare Other | Admitting: Endocrinology

## 2020-03-16 ENCOUNTER — Other Ambulatory Visit: Payer: Self-pay

## 2020-03-16 ENCOUNTER — Encounter: Payer: Self-pay | Admitting: Internal Medicine

## 2020-03-16 DIAGNOSIS — E559 Vitamin D deficiency, unspecified: Secondary | ICD-10-CM | POA: Diagnosis not present

## 2020-03-16 NOTE — Patient Instructions (Addendum)
Blood tests are requested for you today.  We'll let you know about the results.  If the parathyroid is higher, you should consider consulting with a surgeon, as this could be causing kidney stones Please come back for a follow-up appointment in 6 months.

## 2020-03-16 NOTE — Progress Notes (Signed)
   Subjective:    Patient ID: Hector [name], male    DOB: 07-03-1951, 69 y.o.   MRN: [id number]  HPI telehealth visit today via phone x 8 minutes Alternatives to telehealth are presented to this patient, and the patient agrees to the telehealth visit. Pt is advised of the cost of the visit, and agrees to this, also.   Patient is at work, and I am at home.   Persons attending the telehealth visit: the patient and I Pt returns for f/u of primary hyperparathyroidism (dx'ed 2013; HCTZ was changed to lasix; DEXA was normal in 2016, 2018, and 2020; he had episode of urolithiasis in 2018).  Vit-D deficiency (he has had fxs of left little finger, coccyx, and left great toe--all with significant injuries): he denies leg cramps.  He still takes vit-D, 2000 units/d.      Review of Systems Denies ankle swelling    Objective:   Physical Exam       Assessment & Plan:  Hyperparathyroidism: due for recheck. Urolithiasis: id the above persists, he should consider ref surgery.   Patient Instructions  Blood tests are requested for you today.  We'll let you know about the results.  If the parathyroid is higher, you should consider consulting with a surgeon, as this could be causing kidney stones Please come back for a follow-up appointment in 6 months.

## 2020-03-29 ENCOUNTER — Other Ambulatory Visit: Payer: Self-pay | Admitting: Internal Medicine

## 2020-03-30 ENCOUNTER — Other Ambulatory Visit: Payer: Self-pay

## 2020-03-30 ENCOUNTER — Other Ambulatory Visit (INDEPENDENT_AMBULATORY_CARE_PROVIDER_SITE_OTHER): Payer: Medicare Other

## 2020-03-30 DIAGNOSIS — E559 Vitamin D deficiency, unspecified: Secondary | ICD-10-CM | POA: Diagnosis not present

## 2020-03-30 LAB — VITAMIN D 25 HYDROXY (VIT D DEFICIENCY, FRACTURES): VITD: 35.02 ng/mL (ref 30.00–100.00)

## 2020-03-31 ENCOUNTER — Encounter: Payer: Self-pay | Admitting: Endocrinology

## 2020-03-31 LAB — PTH, INTACT AND CALCIUM
Calcium: 10.8 mg/dL — ABNORMAL HIGH (ref 8.6–10.3)
PTH: 101 pg/mL — ABNORMAL HIGH (ref 14–64)

## 2020-04-23 ENCOUNTER — Other Ambulatory Visit: Payer: Self-pay | Admitting: Internal Medicine

## 2020-07-18 ENCOUNTER — Other Ambulatory Visit: Payer: Self-pay | Admitting: Internal Medicine

## 2020-07-18 ENCOUNTER — Other Ambulatory Visit: Payer: Self-pay | Admitting: Endocrinology

## 2020-07-22 ENCOUNTER — Encounter: Payer: Self-pay | Admitting: Internal Medicine

## 2020-07-26 ENCOUNTER — Other Ambulatory Visit: Payer: Self-pay

## 2020-07-26 ENCOUNTER — Encounter: Payer: Self-pay | Admitting: Family

## 2020-07-26 ENCOUNTER — Ambulatory Visit (INDEPENDENT_AMBULATORY_CARE_PROVIDER_SITE_OTHER): Payer: Medicare Other | Admitting: Family

## 2020-07-26 ENCOUNTER — Other Ambulatory Visit: Payer: Self-pay | Admitting: Family

## 2020-07-26 VITALS — BP 130/98 | HR 71 | Temp 98.5°F | Wt 216.8 lb

## 2020-07-26 DIAGNOSIS — R319 Hematuria, unspecified: Secondary | ICD-10-CM | POA: Diagnosis not present

## 2020-07-26 DIAGNOSIS — R05 Cough: Secondary | ICD-10-CM

## 2020-07-26 DIAGNOSIS — I10 Essential (primary) hypertension: Secondary | ICD-10-CM

## 2020-07-26 DIAGNOSIS — K219 Gastro-esophageal reflux disease without esophagitis: Secondary | ICD-10-CM

## 2020-07-26 DIAGNOSIS — R059 Cough, unspecified: Secondary | ICD-10-CM

## 2020-07-26 DIAGNOSIS — R109 Unspecified abdominal pain: Secondary | ICD-10-CM

## 2020-07-26 DIAGNOSIS — R1032 Left lower quadrant pain: Secondary | ICD-10-CM

## 2020-07-26 MED ORDER — BENZONATATE 200 MG PO CAPS: 200.0000 mg | ORAL_CAPSULE | Freq: Three times a day (TID) | ORAL | 0 refills | Status: DC | PRN

## 2020-07-26 MED ORDER — LOSARTAN POTASSIUM 100 MG PO TABS: 100.0000 mg | ORAL_TABLET | Freq: Every day | ORAL | 1 refills | Status: DC

## 2020-07-26 MED ORDER — PANTOPRAZOLE SODIUM 40 MG PO TBEC: 40.0000 mg | DELAYED_RELEASE_TABLET | Freq: Every day | ORAL | 3 refills | Status: DC

## 2020-07-26 MED ORDER — AMOXICILLIN-POT CLAVULANATE 875-125 MG PO TABS: 1.0000 | ORAL_TABLET | Freq: Two times a day (BID) | ORAL | 0 refills | Status: AC

## 2020-07-26 MED ORDER — LOSARTAN POTASSIUM 100 MG PO TABS
100.0000 mg | ORAL_TABLET | Freq: Every day | ORAL | 0 refills | Status: DC
Start: 2020-07-26 — End: 2020-10-25

## 2020-07-26 NOTE — Progress Notes (Signed)
Hector [name] is a 69 y.o. male with the following history as recorded in Epic[employer]:  Patient Active Problem List   Diagnosis Date Noted  . Flu-like symptoms 02/10/2019  . Chronic right shoulder pain 10/08/2018  . Cough 10/01/2018  . Thumb pain, left 08/19/2018  . Right lower quadrant abdominal pain 12/11/2017  . Hyperparathyroidism ([place]) 04/13/2015  . Hypercalcemia 04/09/2015  . Vitamin D deficiency 04/09/2015  . Supraspinatus tendinitis 11/30/2014  . Osteoarthritis 11/06/2014  . Essential hypertension 11/06/2014  . Hyperlipidemia 11/06/2014  . GERD (gastroesophageal reflux disease) 11/06/2014  . Syncope 11/06/2014  . Routine general medical examination at a health care facility 11/06/2014    Current Outpatient Medications  Medication Sig Dispense Refill  . amLODipine (NORVASC) 2.5 MG tablet Take 1 tablet (2.5 mg total) by mouth daily. 90 tablet 3  . aspirin 81 MG tablet Take 81 mg by mouth daily.    . cetirizine (ZYRTEC) 10 MG tablet Take 10 mg by mouth daily.    . fluticasone (FLONASE) 50 MCG/ACT nasal spray Place 2 sprays into both nostrils daily. Annual appt due in June must see provider for future refills 48 g 0  . furosemide (LASIX) 40 MG tablet TAKE 1 TABLET DAILY 90 tablet 3  . losartan (COZAAR) 100 MG tablet Take 1 tablet (100 mg total) by mouth daily. 90 tablet 1  . lovastatin (MEVACOR) 40 MG tablet Take 0.5 tablets (20 mg total) by mouth at bedtime. 45 tablet 3  . meloxicam (MOBIC) 15 MG tablet Take 15 mg by mouth daily.     . pantoprazole (PROTONIX) 40 MG tablet Take 1 tablet (40 mg total) by mouth daily. 90 tablet 3  . terazosin (HYTRIN) 10 MG capsule Take 10 mg by mouth at bedtime.     . triamcinolone cream (KENALOG) 0.1 % Apply topically 2 (two) times daily. Annual appt due in June must see provider for future refills 80 g 8  . amoxicillin-clavulanate (AUGMENTIN) 875-125 MG tablet Take 1 tablet by mouth 2 (two) times daily for 10 days. 20 tablet 0  . benzonatate  (TESSALON) 200 MG capsule Take 1 capsule (200 mg total) by mouth 3 (three) times daily as needed for cough. 30 capsule 0  . losartan (COZAAR) 100 MG tablet Take 1 tablet (100 mg total) by mouth daily. 30 tablet 0   No current facility-administered medications for this visit.    Allergies: Shellfish allergy  Past Medical History:  Diagnosis Date  . Adhesive capsulitis 07/31/2018  . Allergy   . Arthritis   . Chronic kidney disease    kidney stones  . GERD (gastroesophageal reflux disease)   . Hyperlipidemia   . Hypertension   . Tuberculosis     Past Surgical History:  Procedure Laterality Date  . COLONOSCOPY    . POLYPECTOMY    . PROSTATE SURGERY  12/2012   no cancer  . ROTATOR CUFF REPAIR Right 2019    Family History  Problem Relation Age of Onset  . Arthritis Mother   . Hyperlipidemia Mother   . Heart disease Mother   . Stomach cancer Mother   . Colon polyps Mother   . Arthritis Father   . Hyperlipidemia Father   . Heart disease Father   . Kidney disease Father   . Breast cancer Sister   . Hypertension Sister   . Ovarian cancer Sister   . Gallbladder disease Sister        x 6  . Colon cancer Neg Hx   .  Liver cancer Neg Hx   . Rectal cancer Neg Hx   . Esophageal cancer Neg Hx     Social History   Tobacco Use  . Smoking status: Never Smoker  . Smokeless tobacco: Never Used  Substance Use Topics  . Alcohol use: No    Alcohol/week: 0.0 standard drinks    Subjective:  Left flank pain started last week; noticed some blood in his urine on Tuesday/ Wednesday; does feel that now experiencing some urinary hesitancy; History of kidney stone- had to have lithotripsy; had part of prostate removed- June 2014 due to benign growth;   Also requesting updated prescriptions for his Protonix and Losartan;  Would like Rx for [facility]; feels like he is struggling with post-nasal drainage/ cough;    Objective:  Vitals:   07/26/20 1508  BP: (!) 130/98  Pulse: 71   Temp: 98.5 F (36.9 C)  TempSrc: Oral  SpO2: 95%  Weight: 216 lb 12.8 oz (98.3 kg)    General: Well developed, well nourished, in no acute distress  Skin : Warm and dry.  Head: Normocephalic and atraumatic  Eyes: Sclera and conjunctiva clear; pupils round and reactive to light; extraocular movements intact  Ears: External normal; canals clear; tympanic membranes normal  Oropharynx: Pink, supple. No suspicious lesions  Neck: Supple without thyromegaly, adenopathy  Lungs: Respirations unlabored; clear to auscultation bilaterally without wheeze, rales, rhonchi  CVS exam: normal rate and regular rhythm.  Neurologic: Alert and oriented; speech intact; face symmetrical; moves all extremities well; CNII-XII intact without focal deficit   Assessment:  1. Essential hypertension   2. Gastroesophageal reflux disease without esophagitis   3. Cough   4. Flank pain   5. Hematuria, unspecified type     Plan:  1. Not on Losartan today; refill updated; see his PCP for CPE in 3 months; 2. Refill updated on Protonix 40 mg daily; 3. Rx for Augmentin 875 mg bid x 10 days, Tessalon Perles 200 mg tid; 4. & 5. Suspect kidney stone; order updated for renal stone CT; follow-up to be determined;  This visit occurred during the SARS-CoV-2 public health emergency.  Safety protocols were in place, including screening questions prior to the visit, additional usage of staff PPE, and extensive cleaning of exam room while observing appropriate contact time as indicated for disinfecting solutions.     No follow-ups on file.  No orders of the defined types were placed in this encounter.   Requested Prescriptions   Signed Prescriptions Disp Refills  . pantoprazole (PROTONIX) 40 MG tablet 90 tablet 3    Sig: Take 1 tablet (40 mg total) by mouth daily.  Marland Kitchen losartan (COZAAR) 100 MG tablet 90 tablet 1    Sig: Take 1 tablet (100 mg total) by mouth daily.  Marland Kitchen losartan (COZAAR) 100 MG tablet 30 tablet 0    Sig:  Take 1 tablet (100 mg total) by mouth daily.  Marland Kitchen amoxicillin-clavulanate (AUGMENTIN) 875-125 MG tablet 20 tablet 0    Sig: Take 1 tablet by mouth 2 (two) times daily for 10 days.  . benzonatate (TESSALON) 200 MG capsule 30 capsule 0    Sig: Take 1 capsule (200 mg total) by mouth 3 (three) times daily as needed for cough.

## 2020-08-05 ENCOUNTER — Ambulatory Visit
Admission: RE | Admit: 2020-08-05 | Discharge: 2020-08-05 | Disposition: A | Discharge status: Home / Self Care | Payer: Medicare Other | Source: Ambulatory Visit | Attending: Family | Admitting: Family

## 2020-08-05 DIAGNOSIS — R1032 Left lower quadrant pain: Secondary | ICD-10-CM

## 2020-08-05 DIAGNOSIS — N200 Calculus of kidney: Secondary | ICD-10-CM | POA: Diagnosis not present

## 2020-08-05 DIAGNOSIS — N3289 Other specified disorders of bladder: Secondary | ICD-10-CM | POA: Diagnosis not present

## 2020-08-05 DIAGNOSIS — N400 Enlarged prostate without lower urinary tract symptoms: Secondary | ICD-10-CM | POA: Diagnosis not present

## 2020-08-05 DIAGNOSIS — M4316 Spondylolisthesis, lumbar region: Secondary | ICD-10-CM | POA: Diagnosis not present

## 2020-08-06 ENCOUNTER — Other Ambulatory Visit: Payer: Self-pay | Admitting: Family

## 2020-08-06 DIAGNOSIS — R319 Hematuria, unspecified: Secondary | ICD-10-CM

## 2020-08-06 DIAGNOSIS — N3289 Other specified disorders of bladder: Secondary | ICD-10-CM

## 2020-08-10 ENCOUNTER — Ambulatory Visit: Payer: Medicare Other | Attending: Internal Medicine

## 2020-08-10 ENCOUNTER — Ambulatory Visit: Payer: Medicare Other

## 2020-08-10 DIAGNOSIS — Z23 Encounter for immunization: Secondary | ICD-10-CM

## 2020-08-10 NOTE — Progress Notes (Signed)
   Covid-19 Vaccination Clinic  Name:  Alexie Samson    MRN: [id number] DOB: 10/09/1951  08/10/2020  Mr. Hemenway was observed post Covid-19 immunization for 30 minutes based on pre-vaccination screening without incident. He was provided with Vaccine Information Sheet and instruction to access the V-Safe system.   Mr. Miyoshi was instructed to call 911 with any severe reactions post vaccine: Marland Kitchen Difficulty breathing  . Swelling of face and throat  . A fast heartbeat  . A bad rash all over body  . Dizziness and weakness

## 2020-08-19 DIAGNOSIS — R351 Nocturia: Secondary | ICD-10-CM | POA: Diagnosis not present

## 2020-08-19 DIAGNOSIS — N200 Calculus of kidney: Secondary | ICD-10-CM | POA: Diagnosis not present

## 2020-08-19 DIAGNOSIS — R310 Gross hematuria: Secondary | ICD-10-CM | POA: Diagnosis not present

## 2020-08-19 DIAGNOSIS — N401 Enlarged prostate with lower urinary tract symptoms: Secondary | ICD-10-CM | POA: Diagnosis not present

## 2020-09-03 DIAGNOSIS — N320 Bladder-neck obstruction: Secondary | ICD-10-CM | POA: Diagnosis not present

## 2020-09-03 DIAGNOSIS — R310 Gross hematuria: Secondary | ICD-10-CM | POA: Diagnosis not present

## 2020-09-06 ENCOUNTER — Other Ambulatory Visit: Payer: Self-pay | Admitting: Urology

## 2020-09-13 ENCOUNTER — Encounter (HOSPITAL_BASED_OUTPATIENT_CLINIC_OR_DEPARTMENT_OTHER): Payer: Self-pay | Admitting: Urology

## 2020-09-13 ENCOUNTER — Other Ambulatory Visit (HOSPITAL_COMMUNITY)
Admission: RE | Admit: 2020-09-13 | Discharge: 2020-09-13 | Disposition: A | Discharge status: Home / Self Care | Payer: Medicare Other | Source: Ambulatory Visit | Attending: Urology | Admitting: Urology

## 2020-09-13 ENCOUNTER — Other Ambulatory Visit: Payer: Self-pay

## 2020-09-13 DIAGNOSIS — Z20822 Contact with and (suspected) exposure to covid-19: Secondary | ICD-10-CM | POA: Diagnosis not present

## 2020-09-13 DIAGNOSIS — Z01812 Encounter for preprocedural laboratory examination: Secondary | ICD-10-CM | POA: Diagnosis not present

## 2020-09-13 LAB — SARS CORONAVIRUS 2 (TAT 6-24 HRS): SARS Coronavirus 2: NEGATIVE

## 2020-09-13 NOTE — Progress Notes (Signed)
Spoke w/ via phone for pre-op interview--- Lab needs dos---- Istat 8 and ekg               Lab results------ COVID test ------09/13/20  Arrive at  1388 NPO after MN NO Solid Food.  Clear liquids from MN until--- 1045am Medications to take morning of surgery -----Amlodpine, flonase, protonix  Diabetic medication ----- none  Patient Special Instructions -----none  Pre-Op special Istructions -----none  Patient verbalized understanding of instructions that were given at this phone interview. Patient denies shortness of breath, chest pain, fever, cough at this phone interview.

## 2020-09-16 ENCOUNTER — Ambulatory Visit (HOSPITAL_BASED_OUTPATIENT_CLINIC_OR_DEPARTMENT_OTHER): Payer: Medicare Other | Admitting: Anesthesiology

## 2020-09-16 ENCOUNTER — Ambulatory Visit (HOSPITAL_BASED_OUTPATIENT_CLINIC_OR_DEPARTMENT_OTHER)
Admission: RE | Admit: 2020-09-16 | Discharge: 2020-09-16 | Disposition: A | Discharge status: Home / Self Care | Payer: Medicare Other | Attending: Urology | Admitting: Urology

## 2020-09-16 ENCOUNTER — Other Ambulatory Visit: Payer: Self-pay

## 2020-09-16 ENCOUNTER — Encounter (HOSPITAL_BASED_OUTPATIENT_CLINIC_OR_DEPARTMENT_OTHER): Payer: Self-pay | Admitting: Urology

## 2020-09-16 ENCOUNTER — Encounter (HOSPITAL_BASED_OUTPATIENT_CLINIC_OR_DEPARTMENT_OTHER)
Admission: RE | Disposition: A | Discharge status: Home / Self Care | Payer: Self-pay | Source: Home / Self Care | Attending: Urology

## 2020-09-16 DIAGNOSIS — D090 Carcinoma in situ of bladder: Secondary | ICD-10-CM | POA: Diagnosis not present

## 2020-09-16 DIAGNOSIS — Z87442 Personal history of urinary calculi: Secondary | ICD-10-CM | POA: Insufficient documentation

## 2020-09-16 DIAGNOSIS — E785 Hyperlipidemia, unspecified: Secondary | ICD-10-CM | POA: Diagnosis not present

## 2020-09-16 DIAGNOSIS — Z800 Family history of malignant neoplasm of digestive organs: Secondary | ICD-10-CM | POA: Diagnosis not present

## 2020-09-16 DIAGNOSIS — D494 Neoplasm of unspecified behavior of bladder: Secondary | ICD-10-CM | POA: Diagnosis not present

## 2020-09-16 DIAGNOSIS — R9341 Abnormal radiologic findings on diagnostic imaging of renal pelvis, ureter, or bladder: Secondary | ICD-10-CM | POA: Diagnosis not present

## 2020-09-16 DIAGNOSIS — C674 Malignant neoplasm of posterior wall of bladder: Secondary | ICD-10-CM | POA: Insufficient documentation

## 2020-09-16 DIAGNOSIS — N320 Bladder-neck obstruction: Secondary | ICD-10-CM

## 2020-09-16 HISTORY — PX: TRANSURETHRAL RESECTION OF BLADDER TUMOR WITH MITOMYCIN-C: SHX6459

## 2020-09-16 HISTORY — DX: Personal history of urinary calculi: Z87.442

## 2020-09-16 HISTORY — PX: TRANSURETHRAL RESECTION OF BLADDER NECK: SHX6196

## 2020-09-16 HISTORY — PX: CYSTOSCOPY: SHX5120

## 2020-09-16 LAB — POCT I-STAT, CHEM 8
BUN: 18 mg/dL (ref 8–23)
Calcium, Ion: 1.37 mmol/L (ref 1.15–1.40)
Chloride: 102 mmol/L (ref 98–111)
Creatinine, Ser: 1.1 mg/dL (ref 0.61–1.24)
Glucose, Bld: 90 mg/dL (ref 70–99)
HCT: 44 % (ref 39.0–52.0)
Hemoglobin: 15 g/dL (ref 13.0–17.0)
Potassium: 3.4 mmol/L — ABNORMAL LOW (ref 3.5–5.1)
Sodium: 142 mmol/L (ref 135–145)
TCO2: 28 mmol/L (ref 22–32)

## 2020-09-16 SURGERY — RESECTION, BLADDER NECK, TRANSURETHRAL
Anesthesia: General

## 2020-09-16 MED ORDER — CEFAZOLIN SODIUM-DEXTROSE 2-4 GM/100ML-% IV SOLN
INTRAVENOUS | Status: AC
  Filled 2020-09-16: qty 100

## 2020-09-16 MED ORDER — CEPHALEXIN 500 MG PO CAPS: 500.0000 mg | ORAL_CAPSULE | Freq: Two times a day (BID) | ORAL | 0 refills | Status: AC

## 2020-09-16 MED ORDER — MIDAZOLAM HCL 5 MG/5ML IJ SOLN
INTRAMUSCULAR | Status: DC | PRN
  Administered 2020-09-16: 2 mg via INTRAVENOUS

## 2020-09-16 MED ORDER — ONDANSETRON HCL 4 MG/2ML IJ SOLN
INTRAMUSCULAR | Status: DC | PRN
  Administered 2020-09-16: 4 mg via INTRAVENOUS

## 2020-09-16 MED ORDER — LACTATED RINGERS IV SOLN: INTRAVENOUS | Status: DC

## 2020-09-16 MED ORDER — DEXAMETHASONE SODIUM PHOSPHATE 10 MG/ML IJ SOLN
INTRAMUSCULAR | Status: AC
  Filled 2020-09-16: qty 1

## 2020-09-16 MED ORDER — FENTANYL CITRATE (PF) 100 MCG/2ML IJ SOLN
INTRAMUSCULAR | Status: AC
  Filled 2020-09-16: qty 2

## 2020-09-16 MED ORDER — FENTANYL CITRATE (PF) 100 MCG/2ML IJ SOLN
25.0000 ug | INTRAMUSCULAR | Status: DC | PRN
  Administered 2020-09-16: 25 ug via INTRAVENOUS

## 2020-09-16 MED ORDER — ACETAMINOPHEN 500 MG PO TABS
ORAL_TABLET | ORAL | Status: AC
  Filled 2020-09-16: qty 2

## 2020-09-16 MED ORDER — FENTANYL CITRATE (PF) 100 MCG/2ML IJ SOLN
INTRAMUSCULAR | Status: DC | PRN
Start: 2020-09-16 — End: 2020-09-16
  Administered 2020-09-16 (×4): 25 ug via INTRAVENOUS

## 2020-09-16 MED ORDER — CEFAZOLIN SODIUM-DEXTROSE 2-4 GM/100ML-% IV SOLN
2.0000 g | INTRAVENOUS | Status: AC
  Administered 2020-09-16: 2 g via INTRAVENOUS

## 2020-09-16 MED ORDER — DEXAMETHASONE SODIUM PHOSPHATE 4 MG/ML IJ SOLN
INTRAMUSCULAR | Status: DC | PRN
  Administered 2020-09-16: 10 mg via INTRAVENOUS

## 2020-09-16 MED ORDER — STERILE WATER FOR IRRIGATION IR SOLN
Status: DC | PRN
  Administered 2020-09-16: 500 mL

## 2020-09-16 MED ORDER — ACETAMINOPHEN 500 MG PO TABS
1000.0000 mg | ORAL_TABLET | Freq: Once | ORAL | Status: AC
  Administered 2020-09-16: 1000 mg via ORAL

## 2020-09-16 MED ORDER — MIDAZOLAM HCL 2 MG/2ML IJ SOLN
INTRAMUSCULAR | Status: AC
  Filled 2020-09-16: qty 2

## 2020-09-16 MED ORDER — PROPOFOL 10 MG/ML IV BOLUS
INTRAVENOUS | Status: AC
  Filled 2020-09-16: qty 20

## 2020-09-16 MED ORDER — PROPOFOL 10 MG/ML IV BOLUS
INTRAVENOUS | Status: DC | PRN
  Administered 2020-09-16: 200 mg via INTRAVENOUS

## 2020-09-16 MED ORDER — SODIUM CHLORIDE 0.9 % IR SOLN
Status: DC | PRN
  Administered 2020-09-16: 4500 mL

## 2020-09-16 MED ORDER — LIDOCAINE 2% (20 MG/ML) 5 ML SYRINGE
INTRAMUSCULAR | Status: AC
  Filled 2020-09-16: qty 5

## 2020-09-16 MED ORDER — AMISULPRIDE (ANTIEMETIC) 5 MG/2ML IV SOLN: 10.0000 mg | Freq: Once | INTRAVENOUS | Status: DC | PRN

## 2020-09-16 MED ORDER — GEMCITABINE CHEMO FOR BLADDER INSTILLATION 2000 MG
2000.0000 mg | Freq: Once | INTRAVENOUS | Status: AC
  Administered 2020-09-16: 2000 mg via INTRAVESICAL
  Filled 2020-09-16: qty 2000

## 2020-09-16 MED ORDER — ONDANSETRON HCL 4 MG/2ML IJ SOLN
INTRAMUSCULAR | Status: AC
  Filled 2020-09-16: qty 2

## 2020-09-16 MED ORDER — LIDOCAINE HCL (CARDIAC) PF 100 MG/5ML IV SOSY
PREFILLED_SYRINGE | INTRAVENOUS | Status: DC | PRN
  Administered 2020-09-16: 100 mg via INTRAVENOUS

## 2020-09-16 SURGICAL SUPPLY — 41 items
BAG DRAIN URO-CYSTO SKYTR STRL (DRAIN) ×3 IMPLANT
BAG DRN RND TRDRP ANRFLXCHMBR (UROLOGICAL SUPPLIES)
BAG DRN UROCATH (DRAIN) ×1
BAG URINE DRAIN 2000ML AR STRL (UROLOGICAL SUPPLIES) IMPLANT
BAG URINE LEG 500ML (DRAIN) IMPLANT
CATH COUDE FOLEY 2W 5CC 20FR (CATHETERS) ×3 IMPLANT
CATH FOLEY 2WAY SLVR  5CC 18FR (CATHETERS) ×2
CATH FOLEY 2WAY SLVR  5CC 20FR (CATHETERS)
CATH FOLEY 2WAY SLVR  5CC 22FR (CATHETERS)
CATH FOLEY 2WAY SLVR 30CC 22FR (CATHETERS) IMPLANT
CATH FOLEY 2WAY SLVR 5CC 18FR (CATHETERS) ×1 IMPLANT
CATH FOLEY 2WAY SLVR 5CC 20FR (CATHETERS) IMPLANT
CATH FOLEY 2WAY SLVR 5CC 22FR (CATHETERS) IMPLANT
CATH FOLEY 3WAY 30CC 22F (CATHETERS) IMPLANT
CATH HEMA 3WAY 30CC 24FR COUDE (CATHETERS) IMPLANT
CATH HEMA 3WAY 30CC 24FR RND (CATHETERS) IMPLANT
CLOTH BEACON ORANGE TIMEOUT ST (SAFETY) ×3 IMPLANT
ELECT HOOK LOOP BIPOLAR (NEEDLE) ×3 IMPLANT
ELECT REM PT RETURN 9FT ADLT (ELECTROSURGICAL) ×3
ELECTRODE REM PT RTRN 9FT ADLT (ELECTROSURGICAL) ×1 IMPLANT
EVACUATOR MICROVAS BLADDER (UROLOGICAL SUPPLIES) IMPLANT
GLOVE BIO SURGEON STRL SZ8 (GLOVE) ×3 IMPLANT
GOWN STRL REUS W/ TWL XL LVL3 (GOWN DISPOSABLE) ×1 IMPLANT
GOWN STRL REUS W/TWL XL LVL3 (GOWN DISPOSABLE) ×6 IMPLANT
HOLDER FOLEY CATH W/STRAP (MISCELLANEOUS) IMPLANT
IV NS IRRIG 3000ML ARTHROMATIC (IV SOLUTION) ×6 IMPLANT
KIT TURNOVER CYSTO (KITS) ×3 IMPLANT
LOOP CUT BIPOLAR 24F LRG (ELECTROSURGICAL) ×3 IMPLANT
MANIFOLD NEPTUNE II (INSTRUMENTS) ×3 IMPLANT
NEEDLE HYPO 22GX1.5 SAFETY (NEEDLE) IMPLANT
NS IRRIG 500ML POUR BTL (IV SOLUTION) ×3 IMPLANT
PACK CYSTO (CUSTOM PROCEDURE TRAY) ×3 IMPLANT
PLUG CATH AND CAP STER (CATHETERS) ×3 IMPLANT
SYR 30ML LL (SYRINGE) IMPLANT
SYR TOOMEY IRRIG 70ML (MISCELLANEOUS) ×6
SYRINGE TOOMEY IRRIG 70ML (MISCELLANEOUS) ×2 IMPLANT
TUBE CONNECTING 12'X1/4 (SUCTIONS) ×1
TUBE CONNECTING 12X1/4 (SUCTIONS) ×2 IMPLANT
TUBING UROLOGY SET (TUBING) IMPLANT
WATER STERILE IRR 3000ML UROMA (IV SOLUTION) IMPLANT
WATER STERILE IRR 500ML POUR (IV SOLUTION) IMPLANT

## 2020-09-16 NOTE — Anesthesia Postprocedure Evaluation (Signed)
Anesthesia Post Note  Patient: Hector [name]  Procedure(s) Performed: TRANSURETHRAL INCISION OF BLADDER NECK CONTRACTURE (N/A ) TRANSURETHRAL RESECTION OF BLADDER TUMOR WITH post op  GEMCITABINE (N/A ) CYSTOSCOPY (N/A )     Patient location during evaluation: PACU Anesthesia Type: General Level of consciousness: awake and alert Pain management: pain level controlled Vital Signs Assessment: post-procedure vital signs reviewed and stable Respiratory status: spontaneous breathing, nonlabored ventilation, respiratory function stable and patient connected to nasal cannula oxygen Cardiovascular status: blood pressure returned to baseline and stable Postop Assessment: no apparent nausea or vomiting Anesthetic complications: no   No complications documented.  Last Vitals:  Vitals:   09/16/20 1615 09/16/20 1630  BP: 123/85 130/77  Pulse: (!) 47 (!) 50  Resp: 12 12  Temp:    SpO2: 96% 98%    Last Pain:  Vitals:   09/16/20 1630  TempSrc:   PainSc: 0-No pain                 Fernandez Kenley

## 2020-09-16 NOTE — Transfer of Care (Signed)
Immediate Anesthesia Transfer of Care Note  Patient: Hector [name]  Procedure(s) Performed: Procedure(s) (LRB): TRANSURETHRAL INCISION OF BLADDER NECK CONTRACTURE (N/A) TRANSURETHRAL RESECTION OF BLADDER TUMOR WITH post op  GEMCITABINE (N/A) CYSTOSCOPY (N/A)  Patient Location: PACU  Anesthesia Type: General  Level of Consciousness: awake, sedated, patient cooperative and responds to stimulation  Airway & Oxygen Therapy: Patient Spontanous Breathing and Patient connected to [place] 02 and soft FM   Post-op Assessment: Report given to PACU RN, Post -op Vital signs reviewed and stable and Patient moving all extremities  Post vital signs: Reviewed and stable  Complications: No apparent anesthesia complications

## 2020-09-16 NOTE — Interval H&P Note (Signed)
History and Physical Interval Note:  09/16/2020 1:53 PM  Hector [name]  has presented today for surgery, with the diagnosis of [place], POSSIBLE BLADDER TUMOR.  The various methods of treatment have been discussed with the patient and family. After consideration of risks, benefits and other options for treatment, the patient has consented to  Procedure(s) with comments: [place] (N/A) - 45 MINS TRANSURETHRAL RESECTION OF BLADDER TUMOR WITH GEMCITABINE (N/A) CYSTOSCOPY (N/A) as a surgical intervention.  The patient's history has been reviewed, patient examined, no change in status, stable for surgery.  I have reviewed the patient's chart and labs.  Questions were answered to the patient's satisfaction.     Lillette Boxer Elisabel Hanover

## 2020-09-16 NOTE — Anesthesia Preprocedure Evaluation (Signed)
Anesthesia Evaluation  Patient identified by MRN, date of birth, ID band Patient awake    Reviewed: Allergy & Precautions, NPO status , Patient's Chart, lab work & pertinent test results  Airway Mallampati: II  TM Distance: >3 FB Neck ROM: Full    Dental  (+) Dental Advisory Given   Pulmonary neg pulmonary ROS,    breath sounds clear to auscultation       Cardiovascular hypertension, Pt. on medications  Rhythm:Regular Rate:Normal     Neuro/Psych negative neurological ROS     GI/Hepatic Neg liver ROS, GERD  ,  Endo/Other  negative endocrine ROS  Renal/GU Renal disease     Musculoskeletal  (+) Arthritis ,   Abdominal   Peds  Hematology negative hematology ROS (+)   Anesthesia Other Findings   Reproductive/Obstetrics                             Anesthesia Physical Anesthesia Plan  ASA: II  Anesthesia Plan: General   Post-op Pain Management:    Induction: Intravenous  PONV Risk Score and Plan: 2 and Dexamethasone, Ondansetron and Treatment may vary due to age or medical condition  Airway Management Planned: LMA  Additional Equipment:   Intra-op Plan:   Post-operative Plan: Extubation in OR  Informed Consent: I have reviewed the patients History and Physical, chart, labs and discussed the procedure including the risks, benefits and alternatives for the proposed anesthesia with the patient or authorized representative who has indicated his/her understanding and acceptance.     Dental advisory given  Plan Discussed with: CRNA  Anesthesia Plan Comments:         Anesthesia Quick Evaluation

## 2020-09-16 NOTE — H&P (Addendum)
H&P  Chief Complaint: Difficulty urinating  History of Present Illness: 69 year old male presents at this time for cystoscopy, transurethral incision of bladder neck contracture.  He underwent transurethral resection of his prostate in 2014.  He has developed recurrent lower urinary tract symptomatology and recent cystoscopy revealed a tight bladder neck contracture. In case he has bladder tumor, we are set to resect that and add gemcitabine.  Past Medical History:  Diagnosis Date  . Adhesive capsulitis 07/31/2018  . Allergy   . Arthritis   . Chronic kidney disease    kidney stones  . GERD (gastroesophageal reflux disease)   . History of kidney stones   . Hyperlipidemia   . Hypertension     Past Surgical History:  Procedure Laterality Date  . COLONOSCOPY    . POLYPECTOMY    . PROSTATE SURGERY  12/2012   no cancer  . ROTATOR CUFF REPAIR Right 2019    Home Medications:  Allergies as of 09/16/2020      Reactions   Shellfish Allergy Hives      Medication List    Notice   Cannot display discharge medications because the patient has not yet been admitted.     Allergies:  Allergies  Allergen Reactions  . Shellfish Allergy Hives    Family History  Problem Relation Age of Onset  . Arthritis Mother   . Hyperlipidemia Mother   . Heart disease Mother   . Stomach cancer Mother   . Colon polyps Mother   . Arthritis Father   . Hyperlipidemia Father   . Heart disease Father   . Kidney disease Father   . Breast cancer Sister   . Hypertension Sister   . Ovarian cancer Sister   . Gallbladder disease Sister        x 6  . Colon cancer Neg Hx   . Liver cancer Neg Hx   . Rectal cancer Neg Hx   . Esophageal cancer Neg Hx     Social History:  reports that he has never smoked. He has never used smokeless tobacco. He reports that he does not drink alcohol and does not use drugs.  ROS: A complete review of systems was performed.  All systems are negative except for  pertinent findings as noted.  Physical Exam:  Vital signs in last 24 hours: Ht 5\' 9"  (1.753 m)   Wt 96.2 kg   BMI 31.31 kg/m  Constitutional:  Alert and oriented, No acute distress Cardiovascular: Regular rate  Respiratory: Normal respiratory effort GI: Abdomen is soft, nontender, nondistended, no abdominal masses. No CVAT.  Genitourinary: Normal male phallus, testes are descended bilaterally and non-tender and without masses, scrotum is normal in appearance without lesions or masses, perineum is normal on inspection. Lymphatic: No lymphadenopathy Neurologic: Grossly intact, no focal deficits Psychiatric: Normal mood and affect  Laboratory Data:  No results for input(s): WBC, HGB, HCT, PLT in the last 72 hours.  No results for input(s): NA, K, CL, GLUCOSE, BUN, CALCIUM, CREATININE in the last 72 hours.  Invalid input(s): CO3   No results found for this or any previous visit (from the past 24 hour(s)). Recent Results (from the past 240 hour(s))  SARS CORONAVIRUS 2 (TAT 6-24 HRS) Nasopharyngeal Nasopharyngeal Swab     Status: None   Collection Time: 09/13/20  1:40 PM   Specimen: Nasopharyngeal Swab  Result Value Ref Range Status   SARS Coronavirus 2 NEGATIVE NEGATIVE Final    Comment: (NOTE) SARS-CoV-2 target nucleic acids are NOT  DETECTED.  The SARS-CoV-2 RNA is generally detectable in upper and lower respiratory specimens during the acute phase of infection. Negative results do not preclude SARS-CoV-2 infection, do not rule out co-infections with other pathogens, and should not be used as the sole basis for treatment or other patient management decisions. Negative results must be combined with clinical observations, patient history, and epidemiological information. The expected result is Negative.  Fact Sheet for Patients: SugarRoll.be  Fact Sheet for Healthcare Providers: https://www.woods-mathews.com/  This test is not yet  approved or cleared by the [place] FDA and  has been authorized for detection and/or diagnosis of SARS-CoV-2 by FDA under an Emergency Use Authorization (EUA). This EUA will remain  in effect (meaning this test can be used) for the duration of the COVID-19 declaration under Se ction 564(b)(1) of the Act, 21 U.S.C. section 360bbb-3(b)(1), unless the authorization is terminated or revoked sooner.  Performed at [facility], [place] [address] [place],  [address]     Renal Function: No results for input(s): CREATININE in the last 168 hours. CrCl cannot be calculated (Patient's most recent lab result is older than the maximum 21 days allowed.).  Radiologic Imaging: No results found.  Impression/Assessment:  Bladder neck contracture  Plan:  Transurethral incision of bladder neck contracture, if presence of bladder tumor, TURBT/gemcitabine

## 2020-09-16 NOTE — Op Note (Signed)
Preoperative diagnosis: Bladder neck contracture, abnormal bladder wall appearance on CT scan  Postoperative diagnosis: Bladder neck contracture, probable papillary urothelial carcinoma, 4 cm in diameter  Principal procedure: TUR bladder neck contracture, transurethral resection 4 cm bladder tumor, placement of intravesical gemcitabine  Surgeon: Randen Kauth  Anesthesia: General with LMA  Complications: None  Estimated blood loss: Less than 25 mL  Specimen: Bladder tumor fragments, to pathology  Drains: 64 French coud tip catheter  Indications: 69 year old male with history of renal calculi and remote history of TURP.  He came in our office recently for evaluation of gross hematuria.  CT scan showed thickening of his posterior bladder wall with some small calcifications.  Upper tracts were normal with exception of small nonobstructing renal calculi.  Cystoscopic evaluation in her bladder revealed a tight bladder neck contracture, I could not enter the bladder.  He presents at this time for cystoscopy, transurethral incision of bladder neck contracture, possible transurethral resection of bladder tumor.  I discussed the procedure as well as risks and complications with the patient.  He understands and desires to proceed.  Findings: Urethra was normal.  Prostate revealed previous TURP.  There were nonobstructive lateral lobes.  However, there was a high bladder neck and a tight bladder neck contracture which would not admit a 16 French flexible scope in our office.  Following incision, the bladder was inspected.  Ureteral orifice ease were normal in their location and appearance.  There was a papillary bladder tumor extending across the posterior wall of the bladder approximately 3 cm above the ureteral orifice ease.  It was approximately 15 mm wide by 4 cm long.  No other urothelial lesions were noted.  Description of procedure: The patient was properly identified in the holding area.  He received  preoperative IV antibiotics.  Was taken the operating room where general anesthetic was administered with the LMA.  Is placed in the dorsolithotomy position.  Genitalia and perineum were prepped and draped.  Proper timeout performed.  17 French cystoscope advanced under direct vision with the above-mentioned findings in the urethra.  Bladder neck contracture was identified and I could not pass the scope into the bladder.  I then guided a sensor tip guidewire to access the bladder.  The cystoscope was then removed.  The 26 French resectoscope sheath was placed using the visual obturator.  Once the bladder neck contracture was encountered, the visual obturator was removed and the Collins knife placed with the resectoscope.  The incision was made in the 7 and 5:00 positions, from the bladder into the prostatic urethral area.  This opened it up quite nicely.  The resectoscope was then advanced into the bladder using the visual obturator.  The above-mentioned findings were noted.  There were no other urothelial lesions besides the posterior lying papillary bladder tumor.  I then placed the cutting loop on the resectoscope and the bladder tumor Was then totally resected into what I thought was the muscular layer.  Fragments were sent labeled "bladder tumor".  The base of the resected area was then cauterized/coagulated until adequate hemostasis was achieved.  There were no further fragments remaining in the bladder at this point and there was adequate hemostasis.  The scope was then removed.  I then passed a 20 [place] coud tip catheter into the bladder.  Balloon filled with 10 cc of water.  The bladder was drained and the plug placed.  The patient was then awakened and taken to the PACU.  He tolerated the procedure  well.  In the PACU, gemcitabine 2 g and 50 mL of diluent was placed and left indwelling for an hour.

## 2020-09-16 NOTE — Discharge Instructions (Signed)
1. You may see some blood in the urine and may have some burning with urination for 48-72 hours. You also may notice that you have to urinate more frequently or urgently after your procedure which is normal.  2. You should call should you develop an inability urinate, fever > 101, persistent nausea and vomiting that prevents you from eating or drinking to stay hydrated. ain medication prescribed to you if needed for pain. You may also intermittently have blood in the urine until the stent is removed. 3. If you have a catheter, you will be taught how to take care of the catheter by the nursing staff prior to discharge from the hospital.  You may periodically feel a strong urge to void with the catheter in place.  This is a bladder spasm and most often can occur when having a bowel movement or moving around. It is typically self-limited and usually will stop after a few minutes.  You may use some Vaseline or Neosporin around the tip of the catheter to reduce friction at the tip of the penis. You may also see some blood in the urine.  A very small amount of blood can make the urine look quite red.  As long as the catheter is draining well, there usually is not a problem.  However, if the catheter is not draining well and is bloody, you should call the office [phone]) to notify us.  It is okay to remove the catheter as instructed by the nurses on Friday morning. 4. It is okay to resume the aspirin when your urine turns yellow   Post Anesthesia Home Care Instructions  Activity: Get plenty of rest for the remainder of the day. A responsible individual must stay with you for 24 hours following the procedure.  For the next 24 hours, DO NOT: -Drive a car -Paediatric nurse -Drink alcoholic beverages -Take any medication unless instructed by your physician -Make any legal decisions or sign important papers.  Meals: Start with liquid foods such as gelatin or soup. Progress to regular foods as tolerated.  Avoid greasy, spicy, heavy foods. If nausea and/or vomiting occur, drink only clear liquids until the nausea and/or vomiting subsides. Call your physician if vomiting continues.  Special Instructions/Symptoms: Your throat may feel dry or sore from the anesthesia or the breathing tube placed in your throat during surgery. If this causes discomfort, gargle with warm salt water. The discomfort should disappear within 24 hours.

## 2020-09-16 NOTE — Anesthesia Procedure Notes (Signed)
Procedure Name: LMA Insertion Date/Time: 09/16/2020 2:16 PM Performed by: Justice Rocher, CRNA Pre-anesthesia Checklist: Patient identified, Emergency Drugs available, Suction available, Patient being monitored and Timeout performed Patient Re-evaluated:Patient Re-evaluated prior to induction Oxygen Delivery Method: Circle system utilized Preoxygenation: Pre-oxygenation with 100% oxygen Induction Type: IV induction Ventilation: Mask ventilation without difficulty LMA: LMA inserted LMA Size: 4.0 Number of attempts: 1 Airway Equipment and Method: Bite block Placement Confirmation: positive ETCO2,  breath sounds checked- equal and bilateral and CO2 detector Tube secured with: Tape Dental Injury: Teeth and Oropharynx as per pre-operative assessment

## 2020-09-17 ENCOUNTER — Encounter (HOSPITAL_BASED_OUTPATIENT_CLINIC_OR_DEPARTMENT_OTHER): Payer: Self-pay | Admitting: Urology

## 2020-09-17 DIAGNOSIS — R338 Other retention of urine: Secondary | ICD-10-CM | POA: Diagnosis not present

## 2020-09-17 LAB — SURGICAL PATHOLOGY

## 2020-09-18 ENCOUNTER — Encounter: Payer: Self-pay | Admitting: Internal Medicine

## 2020-10-05 ENCOUNTER — Other Ambulatory Visit: Payer: Self-pay | Admitting: Internal Medicine

## 2020-10-07 ENCOUNTER — Other Ambulatory Visit: Payer: Self-pay

## 2020-10-07 ENCOUNTER — Ambulatory Visit (INDEPENDENT_AMBULATORY_CARE_PROVIDER_SITE_OTHER): Payer: Medicare Other

## 2020-10-07 DIAGNOSIS — Z23 Encounter for immunization: Secondary | ICD-10-CM

## 2020-10-25 ENCOUNTER — Other Ambulatory Visit: Payer: Self-pay

## 2020-10-25 ENCOUNTER — Ambulatory Visit (INDEPENDENT_AMBULATORY_CARE_PROVIDER_SITE_OTHER): Payer: Medicare Other | Admitting: Internal Medicine

## 2020-10-25 ENCOUNTER — Encounter: Payer: Self-pay | Admitting: Internal Medicine

## 2020-10-25 VITALS — BP 140/90 | HR 55 | Temp 98.1°F | Ht 69.0 in | Wt 212.0 lb

## 2020-10-25 DIAGNOSIS — N320 Bladder-neck obstruction: Secondary | ICD-10-CM | POA: Diagnosis not present

## 2020-10-25 DIAGNOSIS — R7301 Impaired fasting glucose: Secondary | ICD-10-CM | POA: Diagnosis not present

## 2020-10-25 DIAGNOSIS — I10 Essential (primary) hypertension: Secondary | ICD-10-CM

## 2020-10-25 DIAGNOSIS — Z0000 Encounter for general adult medical examination without abnormal findings: Secondary | ICD-10-CM | POA: Diagnosis not present

## 2020-10-25 DIAGNOSIS — E782 Mixed hyperlipidemia: Secondary | ICD-10-CM

## 2020-10-25 DIAGNOSIS — C674 Malignant neoplasm of posterior wall of bladder: Secondary | ICD-10-CM | POA: Diagnosis not present

## 2020-10-25 DIAGNOSIS — K219 Gastro-esophageal reflux disease without esophagitis: Secondary | ICD-10-CM | POA: Diagnosis not present

## 2020-10-25 DIAGNOSIS — E213 Hyperparathyroidism, unspecified: Secondary | ICD-10-CM | POA: Diagnosis not present

## 2020-10-25 LAB — CBC
HCT: 43.6 % (ref 39.0–52.0)
Hemoglobin: 14.3 g/dL (ref 13.0–17.0)
MCHC: 32.8 g/dL (ref 30.0–36.0)
MCV: 86.8 fl (ref 78.0–100.0)
Platelets: 170 10*3/uL (ref 150.0–400.0)
RBC: 5.03 Mil/uL (ref 4.22–5.81)
RDW: 14.2 % (ref 11.5–15.5)
WBC: 3 10*3/uL — ABNORMAL LOW (ref 4.0–10.5)

## 2020-10-25 LAB — COMPREHENSIVE METABOLIC PANEL
ALT: 19 U/L (ref 0–53)
AST: 18 U/L (ref 0–37)
Albumin: 4.1 g/dL (ref 3.5–5.2)
Alkaline Phosphatase: 82 U/L (ref 39–117)
BUN: 16 mg/dL (ref 6–23)
CO2: 29 mEq/L (ref 19–32)
Calcium: 10.4 mg/dL (ref 8.4–10.5)
Chloride: 107 mEq/L (ref 96–112)
Creatinine, Ser: 1.03 mg/dL (ref 0.40–1.50)
GFR: 75 mL/min (ref >60.00)
Glucose, Bld: 94 mg/dL (ref 70–99)
Potassium: 3.7 mEq/L (ref 3.5–5.1)
Sodium: 142 mEq/L (ref 135–145)
Total Bilirubin: 0.6 mg/dL (ref 0.2–1.2)
Total Protein: 6.8 g/dL (ref 6.0–8.3)

## 2020-10-25 LAB — LIPID PANEL
Cholesterol: 148 mg/dL (ref 0–200)
HDL: 53.9 mg/dL (ref >39.00)
LDL Cholesterol: 84 mg/dL (ref 0–99)
NonHDL: 93.89
Total CHOL/HDL Ratio: 3
Triglycerides: 51 mg/dL (ref 0.0–149.0)
VLDL: 10.2 mg/dL (ref 0.0–40.0)

## 2020-10-25 LAB — HEMOGLOBIN A1C: Hgb A1c MFr Bld: 5.6 % (ref 4.6–6.5)

## 2020-10-25 NOTE — Progress Notes (Signed)
Subjective:   Patient ID: Hector [name], male    DOB: 06/15/1951, 69 y.o.   MRN: [id number]  HPI Here for medicare wellness, no new complaints. Please see A/P for status and treatment of chronic medical problems.   HPI #2: Here for follow up blood pressure (taking amlodipine, lasix, losartan, denies missing doses, denies headaches or chest pains, did eat extra salt last night and drank extra coffee today potentially, recent BP at other doctor visits are normal) and cholesterol (taking lovastatin and denies missing doses or side effects, denies chest pains or stroke symptoms) and GERD (taking protonix with good control, takes daily, denies side effects).   Diet: heart healthy Physical activity: sedentary, walking some Depression/mood screen: negative Hearing: intact to whispered voice Visual acuity: grossly normal with lens, performs annual eye exam  ADLs: capable Fall risk: none Home safety: good Cognitive evaluation: intact to orientation, naming, recall and repetition EOL planning: adv directives discussed    Office Visit from 10/25/2020 in [place] at [facility] Total Score 0      I have personally reviewed and have noted 1. The patient's medical and social history - reviewed today no changes 2. Their use of alcohol, tobacco or illicit drugs 3. Their current medications and supplements 4. The patient's functional ability including ADL's, fall risks, home safety risks and hearing or visual impairment. 5. Diet and physical activities 6. Evidence for depression or mood disorders 7. Care team reviewed and updated  Patient Care Team: Hoyt Koch, MD as PCP - General (Internal Medicine) Past Medical History:  Diagnosis Date  . Adhesive capsulitis 07/31/2018  . Allergy   . Arthritis   . Chronic kidney disease    kidney stones  . GERD (gastroesophageal reflux disease)   . History of kidney stones   . Hyperlipidemia   . Hypertension    Past  Surgical History:  Procedure Laterality Date  . COLONOSCOPY    . CYSTOSCOPY N/A 09/16/2020   Procedure: CYSTOSCOPY;  Surgeon: Franchot Gallo, MD;  Location: [facility];  Service: Urology;  Laterality: N/A;  . POLYPECTOMY    . PROSTATE SURGERY  12/2012   no cancer  . ROTATOR CUFF REPAIR Right 2019  . TRANSURETHRAL RESECTION OF BLADDER NECK N/A 09/16/2020   Procedure: TRANSURETHRAL INCISION OF BLADDER NECK CONTRACTURE;  Surgeon: Franchot Gallo, MD;  Location: [facility];  Service: Urology;  Laterality: N/A;  56 MINS  . TRANSURETHRAL RESECTION OF BLADDER TUMOR WITH MITOMYCIN-C N/A 09/16/2020   Procedure: TRANSURETHRAL RESECTION OF BLADDER TUMOR WITH post op  GEMCITABINE;  Surgeon: Franchot Gallo, MD;  Location: [facility];  Service: Urology;  Laterality: N/A;   Family History  Problem Relation Age of Onset  . Arthritis Mother   . Hyperlipidemia Mother   . Heart disease Mother   . Stomach cancer Mother   . Colon polyps Mother   . Arthritis Father   . Hyperlipidemia Father   . Heart disease Father   . Kidney disease Father   . Breast cancer Sister   . Hypertension Sister   . Ovarian cancer Sister   . Gallbladder disease Sister        x 6  . Colon cancer Neg Hx   . Liver cancer Neg Hx   . Rectal cancer Neg Hx   . Esophageal cancer Neg Hx    Review of Systems  Constitutional: Negative.   HENT: Negative.   Eyes: Negative.   Respiratory: Negative  for cough, chest tightness and shortness of breath.   Cardiovascular: Negative for chest pain, palpitations and leg swelling.  Gastrointestinal: Negative for abdominal distention, abdominal pain, constipation, diarrhea, nausea and vomiting.  Musculoskeletal: Negative.   Skin: Negative.   Neurological: Negative.   Psychiatric/Behavioral: Negative.     Objective:  Physical Exam Constitutional:      Appearance: He is well-developed.  HENT:     Head: Normocephalic and  atraumatic.  Cardiovascular:     Rate and Rhythm: Normal rate and regular rhythm.  Pulmonary:     Effort: Pulmonary effort is normal. No respiratory distress.     Breath sounds: Normal breath sounds. No wheezing or rales.  Abdominal:     General: Bowel sounds are normal. There is no distension.     Palpations: Abdomen is soft.     Tenderness: There is no abdominal tenderness. There is no rebound.  Musculoskeletal:     Cervical back: Normal range of motion.  Skin:    General: Skin is warm and dry.  Neurological:     Mental Status: He is alert and oriented to person, place, and time.     Coordination: Coordination normal.     Vitals:   10/25/20 0952 10/25/20 1022  BP: (!) 142/100 140/90  Pulse: (!) 55   Temp: 98.1 F (36.7 C)   TempSrc: Oral   SpO2: 98%   Weight: 212 lb (96.2 kg)   Height: 5\' 9"  (1.753 m)    This visit occurred during the SARS-CoV-2 public health emergency.  Safety protocols were in place, including screening questions prior to the visit, additional usage of staff PPE, and extensive cleaning of exam room while observing appropriate contact time as indicated for disinfecting solutions.   Assessment & Plan:

## 2020-10-25 NOTE — Patient Instructions (Signed)

## 2020-10-27 NOTE — Assessment & Plan Note (Signed)
Taking protonix daily and symptoms are controlled. Talked about diet modifications as well to help.

## 2020-10-27 NOTE — Assessment & Plan Note (Signed)
Taking losartan 100 mg daily and amlodipine 2.5 mg daily and lasix 40 mg daily. BP mildly elevated in office but normal recently. He will monitor at home and let us know if persistently elevated. Checking CMP and adjust as needed.

## 2020-10-27 NOTE — Assessment & Plan Note (Signed)
Flu shot up to date. Covid-19 up to date including booster. Pneumonia complete. Shingrix counseled. Tetanus due 2030. Colonoscopy due 2029. Dexa due 2023. Counseled about sun safety and mole surveillance. Counseled about the dangers of distracted driving. Given 10 year screening recommendations.

## 2020-10-27 NOTE — Assessment & Plan Note (Signed)
Checking lipid panel and adjust lovastatin 20 mg daily as needed.

## 2020-10-27 NOTE — Assessment & Plan Note (Signed)
Following with endo and needs regular DEXA to assess for bone changes. Most recent is normal and up to date.

## 2020-11-08 ENCOUNTER — Encounter: Payer: Self-pay | Admitting: Internal Medicine

## 2020-11-11 ENCOUNTER — Encounter: Payer: Self-pay | Admitting: Internal Medicine

## 2020-11-11 ENCOUNTER — Ambulatory Visit (INDEPENDENT_AMBULATORY_CARE_PROVIDER_SITE_OTHER): Payer: Medicare Other | Admitting: Internal Medicine

## 2020-11-11 ENCOUNTER — Other Ambulatory Visit: Payer: Self-pay

## 2020-11-11 ENCOUNTER — Ambulatory Visit (INDEPENDENT_AMBULATORY_CARE_PROVIDER_SITE_OTHER): Payer: Medicare Other

## 2020-11-11 VITALS — BP 136/80 | HR 60 | Temp 98.0°F | Ht 69.0 in | Wt 210.0 lb

## 2020-11-11 DIAGNOSIS — G8929 Other chronic pain: Secondary | ICD-10-CM

## 2020-11-11 DIAGNOSIS — M25512 Pain in left shoulder: Secondary | ICD-10-CM | POA: Diagnosis not present

## 2020-11-11 MED ORDER — PREDNISONE 20 MG PO TABS: 40.0000 mg | ORAL_TABLET | Freq: Every day | ORAL | 0 refills | Status: DC

## 2020-11-11 NOTE — Progress Notes (Signed)
   Subjective:   Patient ID: Hector [name], male    DOB: [birth year], 69 y.o.   MRN: [id number]  HPI The patient is a 69 YO man coming in for left shoulder pain. No injury recently. Started weeks ago. Prior injury to right shoulder and surgery. He denies numbness. Some limitation of ROM. Tried otc medication and this helps temporarily. Denies fevers or chills.   Review of Systems  Constitutional: Negative.   HENT: Negative.   Eyes: Negative.   Respiratory: Negative for cough, chest tightness and shortness of breath.   Cardiovascular: Negative for chest pain, palpitations and leg swelling.  Gastrointestinal: Negative for abdominal distention, abdominal pain, constipation, diarrhea, nausea and vomiting.  Musculoskeletal: Positive for arthralgias and myalgias.  Skin: Negative.   Neurological: Negative.   Psychiatric/Behavioral: Negative.     Objective:  Physical Exam Constitutional:      Appearance: He is well-developed and well-nourished.  HENT:     Head: Normocephalic and atraumatic.  Eyes:     Extraocular Movements: EOM normal.  Cardiovascular:     Rate and Rhythm: Normal rate and regular rhythm.  Pulmonary:     Effort: Pulmonary effort is normal. No respiratory distress.     Breath sounds: Normal breath sounds. No wheezing or rales.  Abdominal:     General: Bowel sounds are normal. There is no distension.     Palpations: Abdomen is soft.     Tenderness: There is no abdominal tenderness. There is no rebound.  Musculoskeletal:        General: No edema.     Cervical back: Normal range of motion.     Comments: Left AC tenderness to palpation  Skin:    General: Skin is warm and dry.  Neurological:     Mental Status: He is alert and oriented to person, place, and time.     Coordination: Coordination normal.  Psychiatric:        Mood and Affect: Mood and affect normal.     Vitals:   11/11/20 1541  BP: 136/80  Pulse: 60  Temp: 98 F (36.7 C)  TempSrc: Oral  SpO2: 99%   Weight: 210 lb (95.3 kg)  Height: 5\' 9"  (1.753 m)    This visit occurred during the SARS-CoV-2 public health emergency.  Safety protocols were in place, including screening questions prior to the visit, additional usage of staff PPE, and extensive cleaning of exam room while observing appropriate contact time as indicated for disinfecting solutions.   Assessment & Plan:

## 2020-11-11 NOTE — Patient Instructions (Signed)
We will check the x-ray today and send in prednisone to take 2 pills daily for 5 days.

## 2020-11-12 DIAGNOSIS — G8929 Other chronic pain: Secondary | ICD-10-CM | POA: Insufficient documentation

## 2020-11-12 NOTE — Assessment & Plan Note (Signed)
Rx prednisone and x-ray left shoulder. If no improvement refer to sports medicine.

## 2020-11-14 ENCOUNTER — Emergency Department (HOSPITAL_COMMUNITY)
Admission: EM | Admit: 2020-11-14 | Discharge: 2020-11-15 | Disposition: A | Discharge status: Home / Self Care | Payer: No Typology Code available for payment source | Attending: Emergency Medicine | Admitting: Emergency Medicine

## 2020-11-14 ENCOUNTER — Encounter (HOSPITAL_COMMUNITY): Payer: Self-pay | Admitting: Emergency Medicine

## 2020-11-14 ENCOUNTER — Other Ambulatory Visit: Payer: Self-pay

## 2020-11-14 DIAGNOSIS — Z87442 Personal history of urinary calculi: Secondary | ICD-10-CM | POA: Diagnosis not present

## 2020-11-14 DIAGNOSIS — I129 Hypertensive chronic kidney disease with stage 1 through stage 4 chronic kidney disease, or unspecified chronic kidney disease: Secondary | ICD-10-CM | POA: Diagnosis not present

## 2020-11-14 DIAGNOSIS — Z7901 Long term (current) use of anticoagulants: Secondary | ICD-10-CM | POA: Insufficient documentation

## 2020-11-14 DIAGNOSIS — N189 Chronic kidney disease, unspecified: Secondary | ICD-10-CM | POA: Diagnosis not present

## 2020-11-14 DIAGNOSIS — N200 Calculus of kidney: Secondary | ICD-10-CM

## 2020-11-14 DIAGNOSIS — K219 Gastro-esophageal reflux disease without esophagitis: Secondary | ICD-10-CM | POA: Insufficient documentation

## 2020-11-14 DIAGNOSIS — R109 Unspecified abdominal pain: Secondary | ICD-10-CM | POA: Diagnosis present

## 2020-11-14 DIAGNOSIS — Z79899 Other long term (current) drug therapy: Secondary | ICD-10-CM | POA: Diagnosis not present

## 2020-11-14 LAB — CBC WITH DIFFERENTIAL/PLATELET
Abs Immature Granulocytes: 0.02 10*3/uL (ref 0.00–0.07)
Basophils Absolute: 0 10*3/uL (ref 0.0–0.1)
Basophils Relative: 1 %
Eosinophils Absolute: 0.1 10*3/uL (ref 0.0–0.5)
Eosinophils Relative: 2 %
HCT: 41 % (ref 39.0–52.0)
Hemoglobin: 13.2 g/dL (ref 13.0–17.0)
Immature Granulocytes: 0 %
Lymphocytes Relative: 15 %
Lymphs Abs: 0.8 10*3/uL (ref 0.7–4.0)
MCH: 28.1 pg (ref 26.0–34.0)
MCHC: 32.2 g/dL (ref 30.0–36.0)
MCV: 87.2 fL (ref 80.0–100.0)
Monocytes Absolute: 0.6 10*3/uL (ref 0.1–1.0)
Monocytes Relative: 10 %
Neutro Abs: 4 10*3/uL (ref 1.7–7.7)
Neutrophils Relative %: 72 %
Platelets: 257 10*3/uL (ref 150–400)
RBC: 4.7 MIL/uL (ref 4.22–5.81)
RDW: 13.5 % (ref 11.5–15.5)
WBC: 5.6 10*3/uL (ref 4.0–10.5)
nRBC: 0 % (ref 0.0–0.2)

## 2020-11-14 LAB — COMPREHENSIVE METABOLIC PANEL
ALT: 25 U/L (ref 0–44)
AST: 22 U/L (ref 15–41)
Albumin: 3.6 g/dL (ref 3.5–5.0)
Alkaline Phosphatase: 72 U/L (ref 38–126)
Anion gap: 9 (ref 5–15)
BUN: 21 mg/dL (ref 8–23)
CO2: 26 mmol/L (ref 22–32)
Calcium: 10.5 mg/dL — ABNORMAL HIGH (ref 8.9–10.3)
Chloride: 105 mmol/L (ref 98–111)
Creatinine, Ser: 1.44 mg/dL — ABNORMAL HIGH (ref 0.61–1.24)
GFR, Estimated: 53 mL/min — ABNORMAL LOW (ref >60)
Glucose, Bld: 105 mg/dL — ABNORMAL HIGH (ref 70–99)
Potassium: 3.8 mmol/L (ref 3.5–5.1)
Sodium: 140 mmol/L (ref 135–145)
Total Bilirubin: 0.6 mg/dL (ref 0.3–1.2)
Total Protein: 6.7 g/dL (ref 6.5–8.1)

## 2020-11-14 NOTE — ED Triage Notes (Signed)
Patient reports left flank pain this afternoon , denies injury , no hematuria or dysuria . No fever or chills.

## 2020-11-15 ENCOUNTER — Emergency Department (HOSPITAL_COMMUNITY): Payer: No Typology Code available for payment source

## 2020-11-15 LAB — URINALYSIS, ROUTINE W REFLEX MICROSCOPIC
Bacteria, UA: NONE SEEN
Bilirubin Urine: NEGATIVE
Glucose, UA: NEGATIVE mg/dL
Hgb urine dipstick: NEGATIVE
Ketones, ur: NEGATIVE mg/dL
Nitrite: NEGATIVE
Protein, ur: NEGATIVE mg/dL
Specific Gravity, Urine: 1.02 (ref 1.005–1.030)
pH: 5 (ref 5.0–8.0)

## 2020-11-15 MED ORDER — KETOROLAC TROMETHAMINE 30 MG/ML IJ SOLN
30.0000 mg | Freq: Once | INTRAMUSCULAR | Status: AC
  Administered 2020-11-15: 04:00:00 30 mg via INTRAVENOUS
  Filled 2020-11-15: qty 1

## 2020-11-15 MED ORDER — ONDANSETRON HCL 4 MG/2ML IJ SOLN
4.0000 mg | Freq: Once | INTRAMUSCULAR | Status: AC
  Administered 2020-11-15: 04:00:00 4 mg via INTRAVENOUS
  Filled 2020-11-15: qty 2

## 2020-11-15 MED ORDER — OXYCODONE-ACETAMINOPHEN 5-325 MG PO TABS: 1.0000 | ORAL_TABLET | Freq: Four times a day (QID) | ORAL | 0 refills | Status: DC | PRN

## 2020-11-15 MED ORDER — CEPHALEXIN 500 MG PO CAPS: 500.0000 mg | ORAL_CAPSULE | Freq: Three times a day (TID) | ORAL | 0 refills | Status: DC

## 2020-11-15 MED ORDER — MORPHINE SULFATE (PF) 4 MG/ML IV SOLN
4.0000 mg | Freq: Once | INTRAVENOUS | Status: AC
  Administered 2020-11-15: 04:00:00 4 mg via INTRAVENOUS
  Filled 2020-11-15: qty 1

## 2020-11-15 MED ORDER — TAMSULOSIN HCL 0.4 MG PO CAPS: 0.4000 mg | ORAL_CAPSULE | Freq: Every day | ORAL | 0 refills | Status: DC

## 2020-11-15 NOTE — Discharge Instructions (Signed)
Begin taking Percocet as prescribed as needed for pain.  Begin taking Flomax and Keflex as prescribed today.  Follow-up with urology if symptoms or not improving in the next 3 to 4 days. The contact information for [facility] has been provided in this discharge summary for you to call and make these arrangements.  Return to the emergency department if you develop worsening pain, high fever, or other new and concerning symptoms.

## 2020-11-15 NOTE — ED Provider Notes (Addendum)
[name] EMERGENCY DEPARTMENT Provider Note   CSN: [id number] Arrival date & time: 11/14/20  2101     History Chief Complaint  Patient presents with  . Flank Pain    Hector [name] is a 69 y.o. male.  Patient is a 69 year old male with history of hypertension, hyperlipidemia.  He presents today for evaluation of left flank pain.  This started earlier this afternoon while he was sitting.  This began in the absence of any injury or trauma.  He describes pain to the left flank that radiates to the left groin.  He denies any fevers or chills.  He denies any blood in his urine.  Patient did have bladder surgery for removal of a possible tumor approximately 1 month ago.  The history is provided by the patient.  Flank Pain This is a new problem. The current episode started 6 to 12 hours ago. The problem occurs constantly. The problem has been gradually worsening. Pertinent negatives include no chest pain. Nothing aggravates the symptoms. Nothing relieves the symptoms. He has tried nothing for the symptoms.       Past Medical History:  Diagnosis Date  . Adhesive capsulitis 07/31/2018  . Allergy   . Arthritis   . Chronic kidney disease    kidney stones  . GERD (gastroesophageal reflux disease)   . History of kidney stones   . Hyperlipidemia   . Hypertension     Patient Active Problem List   Diagnosis Date Noted  . Chronic left shoulder pain 11/12/2020  . Chronic right shoulder pain 10/08/2018  . Cough 10/01/2018  . Thumb pain, left 08/19/2018  . Right lower quadrant abdominal pain 12/11/2017  . Hyperparathyroidism ([place]) 04/13/2015  . Vitamin D deficiency 04/09/2015  . Osteoarthritis 11/06/2014  . Essential hypertension 11/06/2014  . Hyperlipidemia 11/06/2014  . GERD (gastroesophageal reflux disease) 11/06/2014  . Syncope 11/06/2014  . Routine general medical examination at a health care facility 11/06/2014    Past Surgical History:  Procedure  Laterality Date  . COLONOSCOPY    . CYSTOSCOPY N/A 09/16/2020   Procedure: CYSTOSCOPY;  Surgeon: Franchot Gallo, MD;  Location: [facility];  Service: Urology;  Laterality: N/A;  . POLYPECTOMY    . PROSTATE SURGERY  12/2012   no cancer  . ROTATOR CUFF REPAIR Right 2019  . TRANSURETHRAL RESECTION OF BLADDER NECK N/A 09/16/2020   Procedure: TRANSURETHRAL INCISION OF BLADDER NECK CONTRACTURE;  Surgeon: Franchot Gallo, MD;  Location: [facility];  Service: Urology;  Laterality: N/A;  11 MINS  . TRANSURETHRAL RESECTION OF BLADDER TUMOR WITH MITOMYCIN-C N/A 09/16/2020   Procedure: TRANSURETHRAL RESECTION OF BLADDER TUMOR WITH post op  GEMCITABINE;  Surgeon: Franchot Gallo, MD;  Location: [facility];  Service: Urology;  Laterality: N/A;       Family History  Problem Relation Age of Onset  . Arthritis Mother   . Hyperlipidemia Mother   . Heart disease Mother   . Stomach cancer Mother   . Colon polyps Mother   . Arthritis Father   . Hyperlipidemia Father   . Heart disease Father   . Kidney disease Father   . Breast cancer Sister   . Hypertension Sister   . Ovarian cancer Sister   . Gallbladder disease Sister        x 6  . Colon cancer Neg Hx   . Liver cancer Neg Hx   . Rectal cancer Neg Hx   . Esophageal cancer Neg Hx  Social History   Tobacco Use  . Smoking status: Never Smoker  . Smokeless tobacco: Never Used  Vaping Use  . Vaping Use: Never used  Substance Use Topics  . Alcohol use: No    Alcohol/week: 0.0 standard drinks  . Drug use: No    Home Medications Prior to Admission medications   Medication Sig Start Date End Date Taking? Authorizing Provider  amLODipine (NORVASC) 2.5 MG tablet Take 1 tablet (2.5 mg total) by mouth daily. 05/16/16   Renato Shin, MD  cetirizine (ZYRTEC) 10 MG tablet Take 10 mg by mouth daily.    [provider]  cholecalciferol (VITAMIN D3) 25 MCG (1000 UNIT) tablet Take  1,000 Units by mouth daily. Takes 2000 IU daily    [provider]  fluticasone (FLONASE) 50 MCG/ACT nasal spray USE 2 SPRAYS IN [facility] NOSTRIL DAILY [facility] APPOINTMENT DUE IN [place], [place] SEE PROVIDER FOR FUTURE REFILLS) 10/06/20   Hoyt Koch, MD  furosemide (LASIX) 40 MG tablet TAKE 1 TABLET DAILY 07/18/20   Renato Shin, MD  loratadine (CLARITIN) 10 MG tablet Take 10 mg by mouth daily.    [provider]  losartan (COZAAR) 100 MG tablet Take 1 tablet (100 mg total) by mouth daily. 07/26/20   Marrian Salvage, FNP  lovastatin (MEVACOR) 40 MG tablet Take 0.5 tablets (20 mg total) by mouth at bedtime. 11/10/14   Hoyt Koch, MD  meloxicam (MOBIC) 15 MG tablet Take 15 mg by mouth daily.     [provider]  pantoprazole (PROTONIX) 40 MG tablet Take 1 tablet (40 mg total) by mouth daily. 07/26/20   Marrian Salvage, FNP  predniSONE (DELTASONE) 20 MG tablet Take 2 tablets (40 mg total) by mouth daily with breakfast. 11/11/20   Hoyt Koch, MD  terazosin (HYTRIN) 10 MG capsule Take 10 mg by mouth at bedtime.     [provider]  triamcinolone cream (KENALOG) 0.1 % Apply topically 2 (two) times daily. Annual appt due in June must see provider for future refills 04/27/20   Hoyt Koch, MD    Allergies    Shellfish allergy  Review of Systems   Review of Systems  Cardiovascular: Negative for chest pain.  Genitourinary: Positive for flank pain.  All other systems reviewed and are negative.   Physical Exam Updated Vital Signs BP 140/77   Pulse (!) 58   Temp 98.6 F (37 C) (Oral)   Resp 14   Ht 5\' 9"  (1.753 m)   Wt 105 kg   SpO2 100%   BMI 34.18 kg/m   Physical Exam Vitals and nursing note reviewed.  Constitutional:      General: He is not in acute distress.    Appearance: He is well-developed and well-nourished. He is not diaphoretic.  HENT:     Head: Normocephalic and atraumatic.     Mouth/Throat:      Mouth: Oropharynx is clear and moist.  Cardiovascular:     Rate and Rhythm: Normal rate and regular rhythm.     Heart sounds: No murmur heard. No friction rub.  Pulmonary:     Effort: Pulmonary effort is normal. No respiratory distress.     Breath sounds: Normal breath sounds. No wheezing or rales.  Abdominal:     General: Bowel sounds are normal. There is no distension.     Palpations: Abdomen is soft.     Tenderness: There is no abdominal tenderness. There is left CVA tenderness. There is no  right CVA tenderness, guarding or rebound.  Musculoskeletal:        General: No edema. Normal range of motion.     Cervical back: Normal range of motion and neck supple.  Skin:    General: Skin is warm and dry.  Neurological:     Mental Status: He is alert and oriented to person, place, and time.     Coordination: Coordination normal.     ED Results / Procedures / Treatments   Labs (all labs ordered are listed, but only abnormal results are displayed) Labs Reviewed  COMPREHENSIVE METABOLIC PANEL - Abnormal; Notable for the following components:      Result Value   Glucose, Bld 105 (*)    Creatinine, Ser 1.44 (*)    Calcium 10.5 (*)    GFR, Estimated 53 (*)    All other components within normal limits  CBC WITH DIFFERENTIAL/PLATELET  URINALYSIS, ROUTINE W REFLEX MICROSCOPIC    EKG None  Radiology No results found.  Procedures Procedures (including critical care time)  Medications Ordered in ED Medications  morphine 4 MG/ML injection 4 mg (has no administration in time range)  ketorolac (TORADOL) 30 MG/ML injection 30 mg (has no administration in time range)  ondansetron (ZOFRAN) injection 4 mg (has no administration in time range)    ED Course  I have reviewed the triage vital signs and the nursing notes.  Pertinent labs & imaging results that were available during my care of the patient were reviewed by me and considered in my medical decision making (see chart for  details).    MDM Rules/Calculators/A&P  Patient presenting here with complaints of left flank pain caused by a 5 mm renal calculus in the proximal left ureter. Patient feeling better after medications given here in the ER. Urine not consistent with UTI. Patient is nontoxic-appearing with no white count. He initially had a temperature of 100.4 upon presentation, however this has since resolved spontaneously. I am uncertain as to the etiology of this. Due to the fever and small leukocytes, patient will be started on antibiotics.  Patient to be discharged with Percocet, Flomax, and follow-up with urology as needed.  Final Clinical Impression(s) / ED Diagnoses Final diagnoses:  None    Rx / DC Orders ED Discharge Orders    None       Veryl Speak, MD 11/15/20 3491    Veryl Speak, MD 11/15/20 [phone]

## 2020-11-16 DIAGNOSIS — N201 Calculus of ureter: Secondary | ICD-10-CM | POA: Diagnosis not present

## 2020-11-16 DIAGNOSIS — R338 Other retention of urine: Secondary | ICD-10-CM | POA: Diagnosis not present

## 2020-11-17 DIAGNOSIS — N201 Calculus of ureter: Secondary | ICD-10-CM | POA: Diagnosis not present

## 2020-12-03 ENCOUNTER — Other Ambulatory Visit: Payer: Self-pay

## 2020-12-03 ENCOUNTER — Emergency Department (HOSPITAL_BASED_OUTPATIENT_CLINIC_OR_DEPARTMENT_OTHER): Payer: Medicare Other

## 2020-12-03 ENCOUNTER — Encounter (HOSPITAL_BASED_OUTPATIENT_CLINIC_OR_DEPARTMENT_OTHER): Payer: Self-pay | Admitting: *Deleted

## 2020-12-03 ENCOUNTER — Emergency Department (HOSPITAL_BASED_OUTPATIENT_CLINIC_OR_DEPARTMENT_OTHER)
Admission: EM | Admit: 2020-12-03 | Discharge: 2020-12-03 | Disposition: A | Discharge status: Home / Self Care | Payer: Medicare Other | Attending: Emergency Medicine | Admitting: Emergency Medicine

## 2020-12-03 DIAGNOSIS — Z7982 Long term (current) use of aspirin: Secondary | ICD-10-CM | POA: Insufficient documentation

## 2020-12-03 DIAGNOSIS — K429 Umbilical hernia without obstruction or gangrene: Secondary | ICD-10-CM | POA: Diagnosis not present

## 2020-12-03 DIAGNOSIS — N189 Chronic kidney disease, unspecified: Secondary | ICD-10-CM | POA: Diagnosis not present

## 2020-12-03 DIAGNOSIS — N3001 Acute cystitis with hematuria: Secondary | ICD-10-CM | POA: Insufficient documentation

## 2020-12-03 DIAGNOSIS — K219 Gastro-esophageal reflux disease without esophagitis: Secondary | ICD-10-CM | POA: Insufficient documentation

## 2020-12-03 DIAGNOSIS — I129 Hypertensive chronic kidney disease with stage 1 through stage 4 chronic kidney disease, or unspecified chronic kidney disease: Secondary | ICD-10-CM | POA: Diagnosis not present

## 2020-12-03 DIAGNOSIS — N400 Enlarged prostate without lower urinary tract symptoms: Secondary | ICD-10-CM | POA: Diagnosis not present

## 2020-12-03 DIAGNOSIS — Z79899 Other long term (current) drug therapy: Secondary | ICD-10-CM | POA: Diagnosis not present

## 2020-12-03 DIAGNOSIS — N200 Calculus of kidney: Secondary | ICD-10-CM | POA: Insufficient documentation

## 2020-12-03 DIAGNOSIS — R109 Unspecified abdominal pain: Secondary | ICD-10-CM | POA: Diagnosis present

## 2020-12-03 DIAGNOSIS — N132 Hydronephrosis with renal and ureteral calculous obstruction: Secondary | ICD-10-CM | POA: Diagnosis not present

## 2020-12-03 LAB — URINALYSIS, ROUTINE W REFLEX MICROSCOPIC
Bilirubin Urine: NEGATIVE
Glucose, UA: NEGATIVE mg/dL
Ketones, ur: NEGATIVE mg/dL
Leukocytes,Ua: NEGATIVE
Nitrite: NEGATIVE
Protein, ur: NEGATIVE mg/dL
Specific Gravity, Urine: 1.01 (ref 1.005–1.030)
pH: 7.5 (ref 5.0–8.0)

## 2020-12-03 LAB — URINALYSIS, MICROSCOPIC (REFLEX)

## 2020-12-03 MED ORDER — KETOROLAC TROMETHAMINE 10 MG PO TABS: 10.0000 mg | ORAL_TABLET | Freq: Four times a day (QID) | ORAL | 0 refills | Status: DC | PRN

## 2020-12-03 MED ORDER — SULFAMETHOXAZOLE-TRIMETHOPRIM 800-160 MG PO TABS: 1.0000 | ORAL_TABLET | Freq: Two times a day (BID) | ORAL | 0 refills | Status: AC

## 2020-12-03 MED ORDER — KETOROLAC TROMETHAMINE 60 MG/2ML IM SOLN
60.0000 mg | Freq: Once | INTRAMUSCULAR | Status: AC
  Administered 2020-12-03: 60 mg via INTRAMUSCULAR
  Filled 2020-12-03: qty 2

## 2020-12-03 MED ORDER — SULFAMETHOXAZOLE-TRIMETHOPRIM 800-160 MG PO TABS: 1.0000 | ORAL_TABLET | Freq: Two times a day (BID) | ORAL | 0 refills | Status: DC

## 2020-12-03 NOTE — ED Notes (Signed)
ED Provider at bedside. 

## 2020-12-03 NOTE — ED Triage Notes (Signed)
Pt reports sudden onset of left lq pain radiating to left flank x last night, feels like his usual kidney stone presentation per pt. Denies any fevers or other c/o.

## 2020-12-03 NOTE — ED Provider Notes (Signed)
[place] EMERGENCY DEPARTMENT Provider Note   CSN: [id number] Arrival date & time: 12/03/20  0831     History Chief Complaint  Patient presents with  . Flank Pain    Hector [name] is a 69 y.o. male.  Patient presents with left-sided flank pain.  Describes a sharp and achy in the left mid flank region.  Pain is persistent since around 1:00 earlier today.  Nonradiating otherwise.  No vomiting no cough no diarrhea.  He states he had kidney stones in the past and it feels very similar.  Denies any difficulty urinating or pain with urination.        Past Medical History:  Diagnosis Date  . Adhesive capsulitis 07/31/2018  . Allergy   . Arthritis   . Chronic kidney disease    kidney stones  . GERD (gastroesophageal reflux disease)   . History of kidney stones   . Hyperlipidemia   . Hypertension     Patient Active Problem List   Diagnosis Date Noted  . Chronic left shoulder pain 11/12/2020  . Chronic right shoulder pain 10/08/2018  . Cough 10/01/2018  . Thumb pain, left 08/19/2018  . Right lower quadrant abdominal pain 12/11/2017  . Hyperparathyroidism ([place]) 04/13/2015  . Vitamin D deficiency 04/09/2015  . Osteoarthritis 11/06/2014  . Essential hypertension 11/06/2014  . Hyperlipidemia 11/06/2014  . GERD (gastroesophageal reflux disease) 11/06/2014  . Syncope 11/06/2014  . Routine general medical examination at a health care facility 11/06/2014    Past Surgical History:  Procedure Laterality Date  . COLONOSCOPY    . CYSTOSCOPY N/A 09/16/2020   Procedure: CYSTOSCOPY;  Surgeon: Franchot Gallo, MD;  Location: [facility];  Service: Urology;  Laterality: N/A;  . POLYPECTOMY    . PROSTATE SURGERY  12/2012   no cancer  . ROTATOR CUFF REPAIR Right 2019  . TRANSURETHRAL RESECTION OF BLADDER NECK N/A 09/16/2020   Procedure: TRANSURETHRAL INCISION OF BLADDER NECK CONTRACTURE;  Surgeon: Franchot Gallo, MD;  Location: [facility];  Service: Urology;  Laterality: N/A;  70 MINS  . TRANSURETHRAL RESECTION OF BLADDER TUMOR WITH MITOMYCIN-C N/A 09/16/2020   Procedure: TRANSURETHRAL RESECTION OF BLADDER TUMOR WITH post op  GEMCITABINE;  Surgeon: Franchot Gallo, MD;  Location: [facility];  Service: Urology;  Laterality: N/A;       Family History  Problem Relation Age of Onset  . Arthritis Mother   . Hyperlipidemia Mother   . Heart disease Mother   . Stomach cancer Mother   . Colon polyps Mother   . Arthritis Father   . Hyperlipidemia Father   . Heart disease Father   . Kidney disease Father   . Breast cancer Sister   . Hypertension Sister   . Ovarian cancer Sister   . Gallbladder disease Sister        x 6  . Colon cancer Neg Hx   . Liver cancer Neg Hx   . Rectal cancer Neg Hx   . Esophageal cancer Neg Hx     Social History   Tobacco Use  . Smoking status: Never Smoker  . Smokeless tobacco: Never Used  Vaping Use  . Vaping Use: Never used  Substance Use Topics  . Alcohol use: No    Alcohol/week: 0.0 standard drinks  . Drug use: No    Home Medications Prior to Admission medications   Medication Sig Start Date End Date Taking? Authorizing Provider  ketorolac (TORADOL) 10 MG tablet Take 1 tablet (  10 mg total) by mouth every 6 (six) hours as needed for up to 14 doses. 12/03/20  Yes Cheryll Cockayne, MD  sulfamethoxazole-trimethoprim (BACTRIM DS) 800-160 MG tablet Take 1 tablet by mouth 2 (two) times daily for 7 days. 12/03/20 12/10/20 Yes Cheryll Cockayne, MD  amLODipine (NORVASC) 2.5 MG tablet Take 1 tablet (2.5 mg total) by mouth daily. 05/16/16   Romero Belling, MD  aspirin EC 81 MG tablet Take 81 mg by mouth daily. Swallow whole.    [provider]  cephALEXin (KEFLEX) 500 MG capsule Take 1 capsule (500 mg total) by mouth 3 (three) times daily. 11/15/20   Geoffery Lyons, MD  cetirizine (ZYRTEC) 10 MG tablet Take 10 mg by mouth daily.    [provider]   cholecalciferol (VITAMIN D3) 25 MCG (1000 UNIT) tablet Take 1,000 Units by mouth at bedtime. Takes 2000 IU daily    [provider]  fluticasone (FLONASE) 50 MCG/ACT nasal spray USE 2 SPRAYS IN EACH NOSTRIL DAILY (ANNUAL APPOINTMENT DUE IN [place], MUST SEE PROVIDER FOR FUTURE REFILLS) Patient taking differently: Place 2 sprays into both nostrils daily. 10/06/20   Myrlene Broker, MD  furosemide (LASIX) 40 MG tablet TAKE 1 TABLET DAILY Patient taking differently: Take 40 mg by mouth daily. 07/18/20   Romero Belling, MD  loratadine (CLARITIN) 10 MG tablet Take 10 mg by mouth daily.    [provider]  losartan (COZAAR) 100 MG tablet Take 1 tablet (100 mg total) by mouth daily. 07/26/20   Olive Bass, FNP  lovastatin (MEVACOR) 40 MG tablet Take 0.5 tablets (20 mg total) by mouth at bedtime. 11/10/14   Myrlene Broker, MD  meloxicam (MOBIC) 15 MG tablet Take 15 mg by mouth daily.     [provider]  oxyCODONE-acetaminophen (PERCOCET) 5-325 MG tablet Take 1-2 tablets by mouth every 6 (six) hours as needed. 11/15/20   Geoffery Lyons, MD  pantoprazole (PROTONIX) 40 MG tablet Take 1 tablet (40 mg total) by mouth daily. 07/26/20   Olive Bass, FNP  predniSONE (DELTASONE) 20 MG tablet Take 2 tablets (40 mg total) by mouth daily with breakfast. Patient taking differently: Take 20 mg by mouth daily with breakfast. 11/11/20   Myrlene Broker, MD  tamsulosin (FLOMAX) 0.4 MG CAPS capsule Take 1 capsule (0.4 mg total) by mouth daily. 11/15/20   Geoffery Lyons, MD  terazosin (HYTRIN) 10 MG capsule Take 10 mg by mouth at bedtime.     [provider]  triamcinolone cream (KENALOG) 0.1 % Apply topically 2 (two) times daily. Annual appt due in June must see provider for future refills Patient taking differently: Apply 1 application topically 2 (two) times daily as needed (rash). 04/27/20   Myrlene Broker, MD    Allergies    Shellfish  allergy  Review of Systems   Review of Systems  Constitutional: Negative for fever.  HENT: Negative for ear pain and sore throat.   Eyes: Negative for pain.  Respiratory: Negative for cough.   Cardiovascular: Negative for chest pain.  Gastrointestinal: Negative for abdominal pain.  Genitourinary: Positive for flank pain.  Musculoskeletal: Negative for back pain.  Skin: Negative for color change and rash.  Neurological: Negative for syncope.  All other systems reviewed and are negative.   Physical Exam Updated Vital Signs BP 133/76 (BP Location: Right Arm)   Pulse 76   Temp 98.4 F (36.9 C) (Oral)   Resp 20   Ht 5\' 9"  (1.753 m)  Wt 93.4 kg   SpO2 99%   BMI 30.42 kg/m   Physical Exam Constitutional:      General: He is not in acute distress.    Appearance: He is well-developed.  HENT:     Head: Normocephalic.     Nose: Nose normal.  Eyes:     Extraocular Movements: Extraocular movements intact.  Cardiovascular:     Rate and Rhythm: Normal rate.  Pulmonary:     Effort: Pulmonary effort is normal.  Abdominal:     Tenderness: There is no right CVA tenderness or left CVA tenderness.  Skin:    Coloration: Skin is not jaundiced.  Neurological:     Mental Status: He is alert. Mental status is at baseline.     ED Results / Procedures / Treatments   Labs (all labs ordered are listed, but only abnormal results are displayed) Labs Reviewed  URINALYSIS, ROUTINE W REFLEX MICROSCOPIC - Abnormal; Notable for the following components:      Result Value   APPearance CLOUDY (*)    Hgb urine dipstick MODERATE (*)    All other components within normal limits  URINALYSIS, MICROSCOPIC (REFLEX) - Abnormal; Notable for the following components:   Bacteria, UA MANY (*)    All other components within normal limits  URINE CULTURE    EKG None  Radiology CT RENAL STONE STUDY  Result Date: 12/03/2020 CLINICAL DATA:  Flank pain. EXAM: CT ABDOMEN AND PELVIS WITHOUT CONTRAST  TECHNIQUE: Multidetector CT imaging of the abdomen and pelvis was performed following the standard protocol without IV contrast. COMPARISON:  November 15, 2020. FINDINGS: Lower chest: No acute abnormality. Hepatobiliary: No focal liver abnormality is seen. No gallstones, gallbladder wall thickening, or biliary dilatation. Pancreas: Unremarkable. No pancreatic ductal dilatation or surrounding inflammatory changes. Spleen: Normal in size without focal abnormality. Adrenals/Urinary Tract: Adrenal glands appear normal. Right kidney and ureter are unremarkable. Mild left hydroureteronephrosis is noted with perinephric stranding secondary to 5 mm calculus in the distal left ureter which has progressed significantly more distally than noted on prior exam. Stable probable nonobstructive calculus seen in dependent portion of urinary bladder. Stomach/Bowel: Stomach is within normal limits. Appendix appears normal. No evidence of bowel wall thickening, distention, or inflammatory changes. Vascular/Lymphatic: No significant vascular findings are present. No enlarged abdominal or pelvic lymph nodes. Reproductive: Stable mild prostatic enlargement is noted. Other: Small fat containing periumbilical hernia is noted. No ascites is noted. Musculoskeletal: No acute or significant osseous findings. IMPRESSION: 1. Mild left hydroureteronephrosis is noted with perinephric stranding secondary to 5 mm calculus in the distal left ureter which has progressed significantly more distally than noted on prior exam. 2. Stable probable nonobstructive calculus seen in dependent portion of urinary bladder. 3. Stable mild prostatic enlargement. 4. Small fat containing periumbilical hernia. Electronically Signed   By: Marijo Conception M.D.   On: 12/03/2020 15:56    Procedures Procedures (including critical care time)  Medications Ordered in ED Medications  ketorolac (TORADOL) injection 60 mg (60 mg Intramuscular Given 12/03/20 1530)    ED  Course  I have reviewed the triage vital signs and the nursing notes.  Pertinent labs & imaging results that were available during my care of the patient were reviewed by me and considered in my medical decision making (see chart for details).    MDM Rules/Calculators/A&P                           Patient  urinalysis shows positive bacteria.  However he denies fevers or chills or difficulty urinating.  Will be given prescription of antibiotics to take at home.  Advise close follow-up with his urologist within 2 or 3 days.  Advised immediate return if he has fevers worsening pain or any additional concerns.   Final Clinical Impression(s) / ED Diagnoses Final diagnoses:  Kidney stone  Acute cystitis with hematuria    Rx / DC Orders ED Discharge Orders         Ordered    ketorolac (TORADOL) 10 MG tablet  Every 6 hours PRN        12/03/20 1602    sulfamethoxazole-trimethoprim (BACTRIM DS) 800-160 MG tablet  2 times daily        12/03/20 1602           Luna Fuse, MD 12/03/20 [phone]

## 2020-12-03 NOTE — ED Notes (Signed)
Pt states he is unable to void, given specimen cup and cc instructions will notify staff when able to provide sample.

## 2020-12-03 NOTE — Discharge Instructions (Signed)
Call your primary care doctor or specialist as discussed in the next 2-3 days.   Return immediately back to the ER if:  Your symptoms worsen within the next 12-24 hours. You develop new symptoms such as new fevers, persistent vomiting, new pain, shortness of breath, or new weakness or numbness, or if you have any other concerns.  

## 2020-12-03 NOTE — ED Notes (Signed)
Pt discharged to home. Discharge instructions have been discussed with patient and/or family members. Pt verbally acknowledges understanding d/c instructions, and endorses comprehension to checkout at registration before leaving.  °

## 2020-12-04 LAB — URINE CULTURE: Culture: NO GROWTH

## 2020-12-22 DIAGNOSIS — N201 Calculus of ureter: Secondary | ICD-10-CM | POA: Diagnosis not present

## 2021-01-12 ENCOUNTER — Telehealth (INDEPENDENT_AMBULATORY_CARE_PROVIDER_SITE_OTHER): Payer: Medicare Other | Admitting: Internal Medicine

## 2021-01-12 ENCOUNTER — Encounter: Payer: Self-pay | Admitting: Internal Medicine

## 2021-01-12 ENCOUNTER — Other Ambulatory Visit: Payer: Self-pay

## 2021-01-12 DIAGNOSIS — J0110 Acute frontal sinusitis, unspecified: Secondary | ICD-10-CM | POA: Diagnosis not present

## 2021-01-12 MED ORDER — PROMETHAZINE-DM 6.25-15 MG/5ML PO SYRP: 5.0000 mL | ORAL_SOLUTION | Freq: Four times a day (QID) | ORAL | 0 refills | Status: DC | PRN

## 2021-01-12 MED ORDER — AMOXICILLIN-POT CLAVULANATE 875-125 MG PO TABS: 1.0000 | ORAL_TABLET | Freq: Two times a day (BID) | ORAL | 0 refills | Status: AC

## 2021-01-12 NOTE — Progress Notes (Signed)
Virtual Visit via Audio Note  I connected with Hector [name] on 01/12/21 at 10:20 AM EST by an audio-only enabled telemedicine application and verified that I am speaking with the correct person using two identifiers.  The patient and the provider were at separate locations throughout the entire encounter. Patient location: home, Provider location: work   I discussed the limitations of evaluation and management by telemedicine and the availability of in person appointments. The patient expressed understanding and agreed to proceed. The patient and the provider were the only parties present for the visit unless noted in HPI below.  History of Present Illness: The patient is a 70 y.o. man with visit for Monday night with cough and throat congestion. Taking mucinex which is not helping much. Has bad cough and cannot cough it up. Denies SOB or fevers or chills. Overall it is worsening. Typically gets this around this time of year. He is vaccinated and boosted against covid-19 and denies exposure known to this recently.   Observations/Objective: A and O times 3, minimal coughing during visit, voice hoarse no dyspnea speaking in full sentences  Assessment and Plan: See problem oriented charting  Follow Up Instructions: rx augmentin and promethazine/dm cough syrup  Visit time 7 minutes in non-face to face communication with patient and coordination of care.   I discussed the assessment and treatment plan with the patient. The patient was provided an opportunity to ask questions and all were answered. The patient agreed with the plan and demonstrated an understanding of the instructions.   The patient was advised to call back or seek an in-person evaluation if the symptoms worsen or if the condition fails to improve as anticipated.  Hoyt Koch, MD

## 2021-01-12 NOTE — Assessment & Plan Note (Signed)
Rx augmentin and promethazine/dm cough syrup. Offered covid-19 testing and he declines.

## 2021-01-20 ENCOUNTER — Other Ambulatory Visit: Payer: Self-pay | Admitting: Family

## 2021-01-20 ENCOUNTER — Other Ambulatory Visit: Payer: Self-pay | Admitting: Internal Medicine

## 2021-01-26 DIAGNOSIS — N320 Bladder-neck obstruction: Secondary | ICD-10-CM | POA: Diagnosis not present

## 2021-01-26 DIAGNOSIS — C674 Malignant neoplasm of posterior wall of bladder: Secondary | ICD-10-CM | POA: Diagnosis not present

## 2021-03-01 ENCOUNTER — Encounter: Payer: Self-pay | Admitting: Internal Medicine

## 2021-03-03 ENCOUNTER — Encounter: Payer: Self-pay | Admitting: Internal Medicine

## 2021-03-04 ENCOUNTER — Ambulatory Visit: Payer: Medicare Other | Admitting: Internal Medicine

## 2021-03-04 DIAGNOSIS — Z0289 Encounter for other administrative examinations: Secondary | ICD-10-CM

## 2021-03-07 DIAGNOSIS — H43812 Vitreous degeneration, left eye: Secondary | ICD-10-CM | POA: Diagnosis not present

## 2021-03-07 DIAGNOSIS — H40013 Open angle with borderline findings, low risk, bilateral: Secondary | ICD-10-CM | POA: Diagnosis not present

## 2021-03-07 DIAGNOSIS — H25013 Cortical age-related cataract, bilateral: Secondary | ICD-10-CM | POA: Diagnosis not present

## 2021-03-07 DIAGNOSIS — H2513 Age-related nuclear cataract, bilateral: Secondary | ICD-10-CM | POA: Diagnosis not present

## 2021-05-05 ENCOUNTER — Other Ambulatory Visit: Payer: Self-pay | Admitting: Internal Medicine

## 2021-05-30 ENCOUNTER — Encounter: Payer: Self-pay | Admitting: Internal Medicine

## 2021-06-20 ENCOUNTER — Encounter: Payer: Self-pay | Admitting: Internal Medicine

## 2021-06-20 ENCOUNTER — Other Ambulatory Visit: Payer: Self-pay | Admitting: Internal Medicine

## 2021-06-20 MED ORDER — BENZONATATE 200 MG PO CAPS: 200.0000 mg | ORAL_CAPSULE | Freq: Three times a day (TID) | ORAL | 0 refills | Status: DC | PRN

## 2021-07-21 ENCOUNTER — Telehealth: Payer: Self-pay

## 2021-07-21 NOTE — Telephone Encounter (Signed)
Patient called for and appointment for feet numbness.

## 2021-07-25 ENCOUNTER — Encounter: Payer: Self-pay | Admitting: Internal Medicine

## 2021-07-25 ENCOUNTER — Other Ambulatory Visit: Payer: Self-pay

## 2021-07-25 ENCOUNTER — Ambulatory Visit (INDEPENDENT_AMBULATORY_CARE_PROVIDER_SITE_OTHER): Payer: Medicare Other | Admitting: Internal Medicine

## 2021-07-25 VITALS — BP 130/72 | HR 70 | Temp 97.8°F | Resp 18 | Ht 69.0 in | Wt 218.2 lb

## 2021-07-25 DIAGNOSIS — R202 Paresthesia of skin: Secondary | ICD-10-CM | POA: Diagnosis not present

## 2021-07-25 DIAGNOSIS — E559 Vitamin D deficiency, unspecified: Secondary | ICD-10-CM

## 2021-07-25 DIAGNOSIS — R7301 Impaired fasting glucose: Secondary | ICD-10-CM

## 2021-07-25 DIAGNOSIS — R200 Anesthesia of skin: Secondary | ICD-10-CM | POA: Insufficient documentation

## 2021-07-25 DIAGNOSIS — E213 Hyperparathyroidism, unspecified: Secondary | ICD-10-CM

## 2021-07-25 LAB — COMPREHENSIVE METABOLIC PANEL
ALT: 21 U/L (ref 0–53)
AST: 20 U/L (ref 0–37)
Albumin: 4.2 g/dL (ref 3.5–5.2)
Alkaline Phosphatase: 77 U/L (ref 39–117)
BUN: 15 mg/dL (ref 6–23)
CO2: 28 mEq/L (ref 19–32)
Calcium: 10.5 mg/dL (ref 8.4–10.5)
Chloride: 105 mEq/L (ref 96–112)
Creatinine, Ser: 1.07 mg/dL (ref 0.40–1.50)
GFR: 71.27 mL/min (ref >60.00)
Glucose, Bld: 90 mg/dL (ref 70–99)
Potassium: 3.5 mEq/L (ref 3.5–5.1)
Sodium: 141 mEq/L (ref 135–145)
Total Bilirubin: 0.7 mg/dL (ref 0.2–1.2)
Total Protein: 7.3 g/dL (ref 6.0–8.3)

## 2021-07-25 LAB — CBC
HCT: 44 % (ref 39.0–52.0)
Hemoglobin: 14.7 g/dL (ref 13.0–17.0)
MCHC: 33.3 g/dL (ref 30.0–36.0)
MCV: 86 fl (ref 78.0–100.0)
Platelets: 146 10*3/uL — ABNORMAL LOW (ref 150.0–400.0)
RBC: 5.11 Mil/uL (ref 4.22–5.81)
RDW: 14.9 % (ref 11.5–15.5)
WBC: 2.7 10*3/uL — ABNORMAL LOW (ref 4.0–10.5)

## 2021-07-25 LAB — VITAMIN B12: Vitamin B-12: 230 pg/mL (ref 211–911)

## 2021-07-25 LAB — T4, FREE: Free T4: 1.05 ng/dL (ref 0.60–1.60)

## 2021-07-25 LAB — TSH: TSH: 3.71 u[IU]/mL (ref 0.35–5.50)

## 2021-07-25 LAB — VITAMIN D 25 HYDROXY (VIT D DEFICIENCY, FRACTURES): VITD: 35.82 ng/mL (ref 30.00–100.00)

## 2021-07-25 LAB — HEMOGLOBIN A1C: Hgb A1c MFr Bld: 5.8 % (ref 4.6–6.5)

## 2021-07-25 NOTE — Patient Instructions (Signed)
We will check the blood flow and the vitamin levels today.

## 2021-07-25 NOTE — Progress Notes (Signed)
   Subjective:   Patient ID: Hector [name], male    DOB: 1951-05-27, 70 y.o.   MRN: [id number]  HPI The patient is a 70 YO man coming in for numbness and tingling in his feet. Started March or April. Is getting consult with surgeon through [facility] for hyperparathyroid as they feel this may be contributing to his problems. No other changes. This gets worse with more walking.   Review of Systems  Constitutional: Negative.   HENT: Negative.    Eyes: Negative.   Respiratory:  Negative for cough, chest tightness and shortness of breath.   Cardiovascular:  Negative for chest pain, palpitations and leg swelling.  Gastrointestinal:  Negative for abdominal distention, abdominal pain, constipation, diarrhea, nausea and vomiting.  Musculoskeletal: Negative.   Skin: Negative.   Neurological:  Positive for numbness.  Psychiatric/Behavioral: Negative.     Objective:  Physical Exam Constitutional:      Appearance: He is well-developed.  HENT:     Head: Normocephalic and atraumatic.  Cardiovascular:     Rate and Rhythm: Normal rate and regular rhythm.  Pulmonary:     Effort: Pulmonary effort is normal. No respiratory distress.     Breath sounds: Normal breath sounds. No wheezing or rales.  Abdominal:     General: Bowel sounds are normal. There is no distension.     Palpations: Abdomen is soft.     Tenderness: There is no abdominal tenderness. There is no rebound.  Musculoskeletal:        General: No tenderness.     Cervical back: Normal range of motion.     Right lower leg: No edema.     Left lower leg: No edema.     Comments: PT pulses felt bilaterally  Skin:    General: Skin is warm and dry.  Neurological:     Mental Status: He is alert and oriented to person, place, and time.     Coordination: Coordination normal.    Vitals:   07/25/21 0800  BP: 130/72  Pulse: 70  Resp: 18  Temp: 97.8 F (36.6 C)  TempSrc: Oral  SpO2: 98%  Weight: 218 lb 3.2 oz (99 kg)  Height: '5\' 9"'$  (1.753  m)    This visit occurred during the SARS-CoV-2 public health emergency.  Safety protocols were in place, including screening questions prior to the visit, additional usage of staff PPE, and extensive cleaning of exam room while observing appropriate contact time as indicated for disinfecting solutions.   Assessment & Plan:

## 2021-07-25 NOTE — Assessment & Plan Note (Signed)
The tingling/numbness could be related. He is advised to keep apt with surgeon to discuss. Most recent bone density is normal. Checking calcium intact and PTH today.

## 2021-07-25 NOTE — Assessment & Plan Note (Signed)
Checking ABI to rule out claudication. Checking TSH and vitamin D and B12 and calcium/PTH and CBC and CMP and HgA1c to rule out alternate cause of the numbness. No low back pain.

## 2021-07-25 NOTE — Assessment & Plan Note (Signed)
Checking vitamin D level and adjust as needed.  

## 2021-07-28 ENCOUNTER — Ambulatory Visit (HOSPITAL_COMMUNITY)
Admission: RE | Admit: 2021-07-28 | Discharge: 2021-07-28 | Disposition: A | Discharge status: Home / Self Care | Payer: Medicare Other | Source: Ambulatory Visit | Attending: Cardiology | Admitting: Cardiology

## 2021-07-28 ENCOUNTER — Other Ambulatory Visit: Payer: Self-pay

## 2021-07-28 DIAGNOSIS — R202 Paresthesia of skin: Secondary | ICD-10-CM | POA: Insufficient documentation

## 2021-07-28 DIAGNOSIS — R200 Anesthesia of skin: Secondary | ICD-10-CM | POA: Diagnosis not present

## 2021-07-28 LAB — PTH, INTACT AND CALCIUM
Calcium: 10.4 mg/dL — ABNORMAL HIGH (ref 8.6–10.3)
PTH: 119 pg/mL — ABNORMAL HIGH (ref 16–77)

## 2021-08-10 DIAGNOSIS — E210 Primary hyperparathyroidism: Secondary | ICD-10-CM | POA: Diagnosis not present

## 2021-08-10 DIAGNOSIS — Z87442 Personal history of urinary calculi: Secondary | ICD-10-CM | POA: Diagnosis not present

## 2021-08-11 ENCOUNTER — Other Ambulatory Visit: Payer: Self-pay | Admitting: Internal Medicine

## 2021-08-11 ENCOUNTER — Other Ambulatory Visit: Payer: Self-pay | Admitting: Endocrinology

## 2021-08-11 ENCOUNTER — Other Ambulatory Visit: Payer: Self-pay | Admitting: Family

## 2021-08-16 ENCOUNTER — Other Ambulatory Visit: Payer: Self-pay | Admitting: Surgery

## 2021-08-16 ENCOUNTER — Other Ambulatory Visit (HOSPITAL_COMMUNITY): Payer: Self-pay | Admitting: Surgery

## 2021-08-16 DIAGNOSIS — E210 Primary hyperparathyroidism: Secondary | ICD-10-CM

## 2021-08-16 DIAGNOSIS — Z87442 Personal history of urinary calculi: Secondary | ICD-10-CM

## 2021-08-23 ENCOUNTER — Ambulatory Visit (HOSPITAL_COMMUNITY)
Admission: RE | Admit: 2021-08-23 | Discharge: 2021-08-23 | Disposition: A | Discharge status: Home / Self Care | Payer: Medicare Other | Source: Ambulatory Visit | Attending: Surgery | Admitting: Surgery

## 2021-08-23 ENCOUNTER — Other Ambulatory Visit: Payer: Self-pay

## 2021-08-23 DIAGNOSIS — Z87442 Personal history of urinary calculi: Secondary | ICD-10-CM | POA: Insufficient documentation

## 2021-08-23 DIAGNOSIS — E042 Nontoxic multinodular goiter: Secondary | ICD-10-CM | POA: Diagnosis not present

## 2021-08-23 DIAGNOSIS — E210 Primary hyperparathyroidism: Secondary | ICD-10-CM | POA: Insufficient documentation

## 2021-08-30 ENCOUNTER — Other Ambulatory Visit: Payer: Self-pay

## 2021-08-30 ENCOUNTER — Encounter (HOSPITAL_COMMUNITY)
Admission: RE | Admit: 2021-08-30 | Discharge: 2021-08-30 | Disposition: A | Discharge status: Home / Self Care | Payer: Medicare Other | Source: Ambulatory Visit | Attending: Surgery | Admitting: Surgery

## 2021-08-30 DIAGNOSIS — E210 Primary hyperparathyroidism: Secondary | ICD-10-CM

## 2021-08-30 DIAGNOSIS — Z87442 Personal history of urinary calculi: Secondary | ICD-10-CM | POA: Diagnosis not present

## 2021-08-30 MED ORDER — TECHNETIUM TC 99M SESTAMIBI GENERIC - CARDIOLITE
24.3000 | Freq: Once | INTRAVENOUS | Status: AC | PRN
  Administered 2021-08-30: 24.3 via INTRAVENOUS

## 2021-09-06 ENCOUNTER — Ambulatory Visit: Payer: Self-pay | Admitting: Surgery

## 2021-09-06 NOTE — Progress Notes (Signed)
Sestamibi localizes a right inferior parathyroid adenoma.  USN did not have any significant findings and no worrisome nodules.  Will plan to proceed with minimally invasive parathyroidectomy as an out-patient procedure at [facility] as we discussed.  I will enter orders today and have the schedulers contact the patient to select a date for surgery.  [place], MD [facility] [place] practice Office: [phone]

## 2021-09-07 ENCOUNTER — Other Ambulatory Visit: Payer: Self-pay

## 2021-09-07 ENCOUNTER — Ambulatory Visit (INDEPENDENT_AMBULATORY_CARE_PROVIDER_SITE_OTHER): Payer: Medicare Other

## 2021-09-07 DIAGNOSIS — Z23 Encounter for immunization: Secondary | ICD-10-CM

## 2021-09-07 NOTE — Progress Notes (Signed)
High Dose flu vacc given w/o any complications.

## 2021-09-14 DIAGNOSIS — Z8551 Personal history of malignant neoplasm of bladder: Secondary | ICD-10-CM | POA: Diagnosis not present

## 2021-09-14 DIAGNOSIS — N320 Bladder-neck obstruction: Secondary | ICD-10-CM | POA: Diagnosis not present

## 2021-09-15 NOTE — Progress Notes (Addendum)
COVID swab appointment: N/A  COVID Vaccine Completed: Yes x5 Date COVID Vaccine completed: 01-06-20 01-29-20 Has received booster:  Yes x2 08-10-20 08-28-20 COVID vaccine manufacturer: [place]      Date of COVID positive in last 90 days:  No  PCP - Pricilla Holm, MD Cardiologist - N/A  Chest x-ray - N/A EKG - 09-16-21 Epic Stress Test - N/A ECHO - N/A Cardiac Cath - N/A Pacemaker/ICD device last checked: Spinal Cord Stimulator:  Sleep Study - N/A CPAP -   Fasting Blood Sugar - N/A Checks Blood Sugar _____ times a day  Blood Thinner Instructions: Aspirin Instructions:  ASA 81 mg. To stop a week before surgery Last Dose:  Activity level:  Can go up a flight of stairs and perform activities of daily living without stopping and without symptoms of chest pain or shortness of breath.  Able to exercise without symptoms  Anesthesia review: N/A  Patient denies shortness of breath, fever, cough and chest pain at PAT appointment   Patient verbalized understanding of instructions that were given to them at the PAT appointment. Patient was also instructed that they will need to review over the PAT instructions again at home before surgery.

## 2021-09-15 NOTE — Patient Instructions (Addendum)
DUE TO COVID-19 ONLY ONE VISITOR IS ALLOWED TO COME WITH YOU AND STAY IN THE WAITING ROOM ONLY DURING PRE OP AND PROCEDURE.   **NO VISITORS ARE ALLOWED IN THE SHORT STAY AREA OR RECOVERY ROOM!!**        Your procedure is scheduled on: Tuesday, 09-20-21   Report to [facility] Main  Entrance     Report to admitting at 8:15 AM   Call this number if you have problems the morning of surgery [phone]   Do not eat food :After Midnight.   May have liquids until 7:30 AM day of surgery  CLEAR LIQUID DIET  Foods Allowed                                                                     Foods Excluded  Water, Black Coffee (no milk/no creamer) and tea, regular and decaf                              liquids that you cannot  Plain Jell-O in any flavor  (No red)                         see through such as: Fruit ices (not with fruit pulp)                                 milk, soups, orange juice  Iced Popsicles (No red)                                    All solid food                             Apple juices Sports drinks like Gatorade (No red) Lightly seasoned clear broth or consume(fat free) Sugar   Oral Hygiene is also important to reduce your risk of infection.                                    Remember - BRUSH YOUR TEETH THE MORNING OF SURGERY WITH YOUR REGULAR TOOTHPASTE   Do NOT smoke after Midnight  Take these medicines the morning of surgery with A SIP OF WATER: Amlodipine, Cetirizine, Loratadine, Pantoprazole, Tamsulosin     Stop all vitamins and herbal supplements a week before surgery              You may not have any metal on your body including jewelry, and body piercing             Do not wear  lotions, powders, cologne, or deodorant              Men may shave face and neck.  Do not bring valuables to the hospital. [place].   Contacts, dentures or bridgework may not be worn into surgery.    Patients discharged the  day of surgery will not be  allowed to drive home.  Special Instructions: Bring a copy of your healthcare power of attorney and living will documents the day of surgery if you haven't scanned them in before.  Please read over the following fact sheets you were given: IF YOU HAVE QUESTIONS ABOUT YOUR PRE OP INSTRUCTIONS PLEASE CALL [facility] - Preparing for Surgery Before surgery, you can play an important role.  Because skin is not sterile, your skin needs to be as free of germs as possible.  You can reduce the number of germs on your skin by washing with CHG (chlorahexidine gluconate) soap before surgery.  CHG is an antiseptic cleaner which kills germs and bonds with the skin to continue killing germs even after washing. Please DO NOT use if you have an allergy to CHG or antibacterial soaps.  If your skin becomes reddened/irritated stop using the CHG and inform your nurse when you arrive at Short Stay. Do not shave (including legs and underarms) for at least 48 hours prior to the first CHG shower.  You may shave your face/neck.  Please follow these instructions carefully:  1.  Shower with CHG Soap the night before surgery and the  morning of surgery.  2.  If you choose to wash your hair, wash your hair first as usual with your normal  shampoo.  3.  After you shampoo, rinse your hair and body thoroughly to remove the shampoo.                             4.  Use CHG as you would any other liquid soap.  You can apply chg directly to the skin and wash.  Gently with a scrungie or clean washcloth.  5.  Apply the CHG Soap to your body ONLY FROM THE NECK DOWN.   Do   not use on face/ open                           Wound or open sores. Avoid contact with eyes, ears mouth and   genitals (private parts).                       Wash face,  Genitals (private parts) with your normal soap.             6.  Wash thoroughly, paying special attention to the area where your    surgery  will be  performed.  7.  Thoroughly rinse your body with warm water from the neck down.  8.  DO NOT shower/wash with your normal soap after using and rinsing off the CHG Soap.                9.  Pat yourself dry with a clean towel.            10.  Wear clean pajamas.            11.  Place clean sheets on your bed the night of your first shower and do not  sleep with pets. Day of Surgery : Do not apply any lotions/deodorants the morning of surgery.  Please wear clean clothes to the hospital/surgery center.  FAILURE TO FOLLOW THESE INSTRUCTIONS MAY RESULT IN THE CANCELLATION OF YOUR SURGERY  PATIENT SIGNATURE_________________________________  NURSE SIGNATURE__________________________________  ________________________________________________________________________

## 2021-09-16 ENCOUNTER — Encounter (HOSPITAL_COMMUNITY): Payer: Self-pay

## 2021-09-16 ENCOUNTER — Encounter (HOSPITAL_COMMUNITY)
Admission: RE | Admit: 2021-09-16 | Discharge: 2021-09-16 | Disposition: A | Discharge status: Home / Self Care | Payer: Medicare Other | Source: Ambulatory Visit | Attending: Surgery | Admitting: Surgery

## 2021-09-16 ENCOUNTER — Other Ambulatory Visit: Payer: Self-pay

## 2021-09-16 DIAGNOSIS — Z01818 Encounter for other preprocedural examination: Secondary | ICD-10-CM | POA: Insufficient documentation

## 2021-09-16 HISTORY — DX: Hyperparathyroidism, unspecified: E21.3

## 2021-09-16 HISTORY — DX: Malignant neoplasm of bladder, unspecified: C67.9

## 2021-09-16 LAB — BASIC METABOLIC PANEL
Anion gap: 7 (ref 5–15)
BUN: 18 mg/dL (ref 8–23)
CO2: 25 mmol/L (ref 22–32)
Calcium: 10.1 mg/dL (ref 8.9–10.3)
Chloride: 106 mmol/L (ref 98–111)
Creatinine, Ser: 1.08 mg/dL (ref 0.61–1.24)
GFR, Estimated: >60 mL/min (ref >60)
Glucose, Bld: 86 mg/dL (ref 70–99)
Potassium: 3.6 mmol/L (ref 3.5–5.1)
Sodium: 138 mmol/L (ref 135–145)

## 2021-09-16 LAB — CBC
HCT: 42.8 % (ref 39.0–52.0)
Hemoglobin: 14 g/dL (ref 13.0–17.0)
MCH: 28.6 pg (ref 26.0–34.0)
MCHC: 32.7 g/dL (ref 30.0–36.0)
MCV: 87.3 fL (ref 80.0–100.0)
Platelets: 168 10*3/uL (ref 150–400)
RBC: 4.9 MIL/uL (ref 4.22–5.81)
RDW: 13.7 % (ref 11.5–15.5)
WBC: 2.5 10*3/uL — ABNORMAL LOW (ref 4.0–10.5)
nRBC: 0 % (ref 0.0–0.2)

## 2021-09-19 ENCOUNTER — Encounter (HOSPITAL_COMMUNITY): Payer: Self-pay | Admitting: Surgery

## 2021-09-19 NOTE — Anesthesia Preprocedure Evaluation (Addendum)
Anesthesia Evaluation  Patient identified by MRN, date of birth, ID band Patient awake    Reviewed: Allergy & Precautions, NPO status , Patient's Chart, lab work & pertinent test results  History of Anesthesia Complications Negative for: history of anesthetic complications  Airway Mallampati: II  TM Distance: >3 FB Neck ROM: Full    Dental  (+) Missing,    Pulmonary neg pulmonary ROS,    Pulmonary exam normal        Cardiovascular hypertension, Pt. on medications Normal cardiovascular exam     Neuro/Psych negative neurological ROS  negative psych ROS   GI/Hepatic Neg liver ROS, GERD  Medicated and Controlled,  Endo/Other  negative endocrine ROS  Renal/GU negative Renal ROS  negative genitourinary   Musculoskeletal  (+) Arthritis ,   Abdominal   Peds  Hematology negative hematology ROS (+)   Anesthesia Other Findings PRIMARY HYPERPARATHYROIDISM  Reproductive/Obstetrics negative OB ROS                            Anesthesia Physical Anesthesia Plan  ASA: 2  Anesthesia Plan: General   Post-op Pain Management:    Induction: Intravenous  PONV Risk Score and Plan: 2 and Treatment may vary due to age or medical condition, Ondansetron, Dexamethasone and Midazolam  Airway Management Planned: Oral ETT  Additional Equipment: None  Intra-op Plan:   Post-operative Plan: Extubation in OR  Informed Consent: I have reviewed the patients History and Physical, chart, labs and discussed the procedure including the risks, benefits and alternatives for the proposed anesthesia with the patient or authorized representative who has indicated his/her understanding and acceptance.     Dental advisory given  Plan Discussed with: CRNA  Anesthesia Plan Comments:        Anesthesia Quick Evaluation

## 2021-09-19 NOTE — H&P (Signed)
Chief Complaint: New Consultation (hyperparathyroidism)   History of Present Illness:  Patient is referred from the [facility] for surgical evaluation and management of suspected primary hyperparathyroidism. Patient's primary care physician is Dr. Pricilla Holm. Patient's endocrinologist is Dr. Renato Shin. Patient had first been noted to have hypercalcemia approximately 3 to 4 years ago. Complications have included nephrolithiasis and bone and joint discomfort and muscle discomfort and muscle weakness. He denies any significant fatigue. He has had no recent fractures. Laboratory studies demonstrate an elevated calcium level ranging from 10.3-10.6. Intact PTH level is elevated from 110 to a recent level of 194. 24-hour urine collection in the past has been elevated as high as 304. Patient has had a normal vitamin D level. He is also undergone bone density scanning with normal results. Patient has had no prior head or neck surgery. There is no family history of parathyroid disease or other endocrine neoplasm. 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  Review of Systems: A complete review of systems was obtained from the patient. I have reviewed this information and discussed as appropriate with the patient. See HPI as well for other ROS.  Review of Systems  Constitutional: Negative.  HENT: Negative.  Eyes: Negative.  Respiratory: Negative.  Cardiovascular: Negative.  Gastrointestinal: Negative.  Genitourinary: Negative.  Musculoskeletal: Positive for joint pain and myalgias.  Skin: Negative.  Neurological: Positive for weakness.  Endo/Heme/Allergies: Negative.  Psychiatric/Behavioral: Negative.    Medical History: History reviewed. No pertinent past medical history.  Patient Active Problem List  Diagnosis   Primary hyperparathyroidism (CMS-HCC)   History of nephrolithiasis   History  reviewed. No pertinent surgical history.   Allergies  Allergen Reactions   Lisinopril Cough   Shellfish Containing Products Rash, Nausea, Nausea And Vomiting and Hives   No current outpatient medications on file prior to visit.   No current facility-administered medications on file prior to visit.   History reviewed. No pertinent family history.   Social History   Tobacco Use  Smoking Status Not on file  Smokeless Tobacco Not on file    Social History   Socioeconomic History   Marital status: Married   Objective:   Vitals:  BP: 120/78  Pulse: 88  Temp: 36.8 C (98.2 F)  SpO2: 99%  Weight: 98 kg (216 lb)  Height: 175.3 cm (5\' 9" )   Body mass index is 31.9 kg/m.  Physical Exam   GENERAL APPEARANCE Development: normal Nutritional status: normal Gross deformities: none  SKIN Rash, lesions, ulcers: none Induration, erythema: none Nodules: none palpable  EYES Conjunctiva and lids: normal Pupils: equal and reactive Iris: normal bilaterally  EARS, NOSE, MOUTH, THROAT External ears: no lesion or deformity External nose: no lesion or deformity Hearing: grossly normal Due to Covid-19 pandemic, patient is wearing a mask.  NECK Symmetric: yes Trachea: midline Thyroid: no palpable nodules in the thyroid bed  CHEST Respiratory effort: normal Retraction or accessory muscle use: no Breath sounds: normal bilaterally Rales, rhonchi, wheeze: none  CARDIOVASCULAR Auscultation: regular rhythm, normal rate Murmurs: none Pulses: radial pulse 2+ palpable Lower extremity edema: none  MUSCULOSKELETAL Station and gait: normal Digits and nails: no clubbing or cyanosis Muscle strength: grossly normal all extremities Range of motion: grossly normal all extremities Deformity: none  LYMPHATIC Cervical: none palpable Supraclavicular: none palpable  PSYCHIATRIC Oriented to person, place, and time: yes Mood and affect: normal for situation Judgment and  insight: appropriate for situation  Assessment and Plan:  Diagnoses and all orders for this visit:  Primary hyperparathyroidism (CMS-HCC)  History of nephrolithiasis   Patient is referred for surgical evaluation of suspected hyperparathyroidism.  Patient provided with a copy of "Parathyroid Surgery: Treatment for Your Parathyroid Gland Problem", published by Krames, 12 pages. Book reviewed and explained to patient during visit today.  Patient has biochemical evidence of primary hyperparathyroidism. He does have complications including nephrolithiasis, myalgias, and weakness. Patient has not had any imaging studies performed. I have recommended proceeding with imaging studies to include an ultrasound examination of the neck as well as a nuclear medicine parathyroid scan. We discussed these studies. Hopefully these studies will confirm the diagnosis of primary hyperparathyroidism and localize the parathyroid adenoma. If so, the patient will be a good candidate for minimally invasive outpatient surgery which was discussed during today's visit. If the studies fail to localize a parathyroid adenoma, then I would proceed with a 4D-CT scan of the neck in hopes of identifying the parathyroid adenoma. The patient understands the rationale for the studies and agrees to proceed.  We will contact the patient with the results of his studies when they are available and make a decision on further management at that time.  Armandina Gemma, MD [facility] A [place] practice Office: [phone]

## 2021-09-20 ENCOUNTER — Encounter (HOSPITAL_COMMUNITY)
Admission: RE | Disposition: A | Discharge status: Home / Self Care | Payer: Self-pay | Source: Ambulatory Visit | Attending: Surgery

## 2021-09-20 ENCOUNTER — Ambulatory Visit (HOSPITAL_COMMUNITY): Payer: Medicare Other | Admitting: Anesthesiology

## 2021-09-20 ENCOUNTER — Ambulatory Visit (HOSPITAL_COMMUNITY)
Admission: RE | Admit: 2021-09-20 | Discharge: 2021-09-20 | Disposition: A | Discharge status: Home / Self Care | Payer: Medicare Other | Source: Ambulatory Visit | Attending: Surgery | Admitting: Surgery

## 2021-09-20 ENCOUNTER — Encounter (HOSPITAL_COMMUNITY): Payer: Self-pay | Admitting: Surgery

## 2021-09-20 DIAGNOSIS — E213 Hyperparathyroidism, unspecified: Secondary | ICD-10-CM | POA: Diagnosis present

## 2021-09-20 DIAGNOSIS — N189 Chronic kidney disease, unspecified: Secondary | ICD-10-CM | POA: Diagnosis not present

## 2021-09-20 DIAGNOSIS — Z91013 Allergy to seafood: Secondary | ICD-10-CM | POA: Diagnosis not present

## 2021-09-20 DIAGNOSIS — Z888 Allergy status to other drugs, medicaments and biological substances status: Secondary | ICD-10-CM | POA: Diagnosis not present

## 2021-09-20 DIAGNOSIS — E785 Hyperlipidemia, unspecified: Secondary | ICD-10-CM | POA: Diagnosis not present

## 2021-09-20 DIAGNOSIS — K219 Gastro-esophageal reflux disease without esophagitis: Secondary | ICD-10-CM | POA: Diagnosis not present

## 2021-09-20 DIAGNOSIS — E210 Primary hyperparathyroidism: Secondary | ICD-10-CM | POA: Diagnosis not present

## 2021-09-20 DIAGNOSIS — I129 Hypertensive chronic kidney disease with stage 1 through stage 4 chronic kidney disease, or unspecified chronic kidney disease: Secondary | ICD-10-CM | POA: Insufficient documentation

## 2021-09-20 HISTORY — PX: PARATHYROIDECTOMY: SHX19

## 2021-09-20 SURGERY — PARATHYROIDECTOMY
Anesthesia: General | Site: Neck | Laterality: Right

## 2021-09-20 MED ORDER — MIDAZOLAM HCL 2 MG/2ML IJ SOLN
INTRAMUSCULAR | Status: DC | PRN
Start: 2021-09-20 — End: 2021-09-20
  Administered 2021-09-20 (×2): 1 mg via INTRAVENOUS

## 2021-09-20 MED ORDER — PHENYLEPHRINE HCL (PRESSORS) 10 MG/ML IV SOLN
INTRAVENOUS | Status: AC
  Filled 2021-09-20: qty 1

## 2021-09-20 MED ORDER — PHENYLEPHRINE HCL (PRESSORS) 10 MG/ML IV SOLN
INTRAVENOUS | Status: AC
  Filled 2021-09-20: qty 2

## 2021-09-20 MED ORDER — ROCURONIUM BROMIDE 10 MG/ML (PF) SYRINGE
PREFILLED_SYRINGE | INTRAVENOUS | Status: DC | PRN
  Administered 2021-09-20: 60 mg via INTRAVENOUS

## 2021-09-20 MED ORDER — PHENYLEPHRINE 40 MCG/ML (10ML) SYRINGE FOR IV PUSH (FOR BLOOD PRESSURE SUPPORT)
PREFILLED_SYRINGE | INTRAVENOUS | Status: DC | PRN
  Administered 2021-09-20: 80 ug via INTRAVENOUS
  Administered 2021-09-20: 120 ug via INTRAVENOUS

## 2021-09-20 MED ORDER — FENTANYL CITRATE PF 50 MCG/ML IJ SOSY: 25.0000 ug | PREFILLED_SYRINGE | INTRAMUSCULAR | Status: DC | PRN

## 2021-09-20 MED ORDER — CHLORHEXIDINE GLUCONATE CLOTH 2 % EX PADS: 6.0000 | MEDICATED_PAD | Freq: Once | CUTANEOUS | Status: DC

## 2021-09-20 MED ORDER — MIDAZOLAM HCL 2 MG/2ML IJ SOLN
INTRAMUSCULAR | Status: AC
  Filled 2021-09-20: qty 2

## 2021-09-20 MED ORDER — BUPIVACAINE HCL (PF) 0.25 % IJ SOLN
INTRAMUSCULAR | Status: AC
  Filled 2021-09-20: qty 30

## 2021-09-20 MED ORDER — 0.9 % SODIUM CHLORIDE (POUR BTL) OPTIME
TOPICAL | Status: DC | PRN
  Administered 2021-09-20: 1000 mL

## 2021-09-20 MED ORDER — OXYCODONE HCL 5 MG/5ML PO SOLN: 5.0000 mg | Freq: Once | ORAL | Status: DC | PRN

## 2021-09-20 MED ORDER — PHENYLEPHRINE HCL-NACL 20-0.9 MG/250ML-% IV SOLN
INTRAVENOUS | Status: DC | PRN
  Administered 2021-09-20: 75 ug/min via INTRAVENOUS

## 2021-09-20 MED ORDER — TRAMADOL HCL 50 MG PO TABS: 50.0000 mg | ORAL_TABLET | Freq: Four times a day (QID) | ORAL | 0 refills | Status: DC | PRN

## 2021-09-20 MED ORDER — ORAL CARE MOUTH RINSE
15.0000 mL | Freq: Once | OROMUCOSAL | Status: AC
Start: 2021-09-20 — End: 2021-09-20

## 2021-09-20 MED ORDER — ALBUMIN HUMAN 5 % IV SOLN: INTRAVENOUS | Status: DC | PRN

## 2021-09-20 MED ORDER — PROPOFOL 10 MG/ML IV BOLUS
INTRAVENOUS | Status: AC
  Filled 2021-09-20: qty 20

## 2021-09-20 MED ORDER — PROPOFOL 10 MG/ML IV BOLUS
INTRAVENOUS | Status: DC | PRN
  Administered 2021-09-20: 170 mg via INTRAVENOUS
  Administered 2021-09-20: 30 mg via INTRAVENOUS

## 2021-09-20 MED ORDER — CHLORHEXIDINE GLUCONATE 0.12 % MT SOLN
15.0000 mL | Freq: Once | OROMUCOSAL | Status: AC
  Administered 2021-09-20: 15 mL via OROMUCOSAL

## 2021-09-20 MED ORDER — SUGAMMADEX SODIUM 500 MG/5ML IV SOLN
INTRAVENOUS | Status: DC | PRN
  Administered 2021-09-20: 300 mg via INTRAVENOUS

## 2021-09-20 MED ORDER — OXYCODONE HCL 5 MG PO TABS: 5.0000 mg | ORAL_TABLET | Freq: Once | ORAL | Status: DC | PRN

## 2021-09-20 MED ORDER — EPHEDRINE SULFATE-NACL 50-0.9 MG/10ML-% IV SOSY
PREFILLED_SYRINGE | INTRAVENOUS | Status: DC | PRN
  Administered 2021-09-20 (×3): 10 mg via INTRAVENOUS
  Administered 2021-09-20 (×2): 15 mg via INTRAVENOUS
  Administered 2021-09-20: 5 mg via INTRAVENOUS
  Administered 2021-09-20: 10 mg via INTRAVENOUS
  Administered 2021-09-20: 5 mg via INTRAVENOUS
  Administered 2021-09-20: 10 mg via INTRAVENOUS

## 2021-09-20 MED ORDER — BUPIVACAINE HCL 0.25 % IJ SOLN
INTRAMUSCULAR | Status: DC | PRN
  Administered 2021-09-20: 10 mL

## 2021-09-20 MED ORDER — LIDOCAINE 2% (20 MG/ML) 5 ML SYRINGE
INTRAMUSCULAR | Status: DC | PRN
  Administered 2021-09-20: 100 mg via INTRAVENOUS

## 2021-09-20 MED ORDER — FENTANYL CITRATE (PF) 250 MCG/5ML IJ SOLN
INTRAMUSCULAR | Status: AC
  Filled 2021-09-20: qty 5

## 2021-09-20 MED ORDER — LACTATED RINGERS IV SOLN: INTRAVENOUS | Status: DC

## 2021-09-20 MED ORDER — CEFAZOLIN SODIUM-DEXTROSE 2-4 GM/100ML-% IV SOLN
2.0000 g | INTRAVENOUS | Status: AC
  Administered 2021-09-20: 2 g via INTRAVENOUS
  Filled 2021-09-20: qty 100

## 2021-09-20 MED ORDER — FENTANYL CITRATE (PF) 250 MCG/5ML IJ SOLN
INTRAMUSCULAR | Status: DC | PRN
  Administered 2021-09-20 (×5): 50 ug via INTRAVENOUS

## 2021-09-20 MED ORDER — ACETAMINOPHEN 500 MG PO TABS
1000.0000 mg | ORAL_TABLET | Freq: Once | ORAL | Status: AC
  Administered 2021-09-20: 1000 mg via ORAL
  Filled 2021-09-20: qty 2

## 2021-09-20 SURGICAL SUPPLY — 30 items
ATTRACTOMAT 16X20 MAGNETIC DRP (DRAPES) ×2 IMPLANT
BAG COUNTER SPONGE SURGICOUNT (BAG) ×2 IMPLANT
BLADE SURG 15 STRL LF DISP TIS (BLADE) ×1 IMPLANT
BLADE SURG 15 STRL SS (BLADE) ×1
CHLORAPREP W/TINT 26 (MISCELLANEOUS) ×2 IMPLANT
CLIP TI MEDIUM 6 (CLIP) ×4 IMPLANT
CLIP TI WIDE RED SMALL 6 (CLIP) ×4 IMPLANT
COVER SURGICAL LIGHT HANDLE (MISCELLANEOUS) ×2 IMPLANT
DERMABOND ADVANCED (GAUZE/BANDAGES/DRESSINGS) ×1
DERMABOND ADVANCED .7 DNX12 (GAUZE/BANDAGES/DRESSINGS) ×1 IMPLANT
DRAPE LAPAROTOMY T 98X78 PEDS (DRAPES) ×2 IMPLANT
DRAPE UTILITY XL STRL (DRAPES) ×2 IMPLANT
ELECT REM PT RETURN 15FT ADLT (MISCELLANEOUS) ×2 IMPLANT
GAUZE 4X4 16PLY ~~LOC~~+RFID DBL (SPONGE) ×2 IMPLANT
GLOVE SURG SYN 7.5  E (GLOVE) ×2
GLOVE SURG SYN 7.5 E (GLOVE) ×2 IMPLANT
GOWN STRL REUS W/TWL XL LVL3 (GOWN DISPOSABLE) ×6 IMPLANT
HEMOSTAT SURGICEL 2X4 FIBR (HEMOSTASIS) ×2 IMPLANT
ILLUMINATOR WAVEGUIDE N/F (MISCELLANEOUS) IMPLANT
KIT BASIN OR (CUSTOM PROCEDURE TRAY) ×2 IMPLANT
NEEDLE HYPO 25X1 1.5 SAFETY (NEEDLE) ×2 IMPLANT
PACK BASIC VI WITH GOWN DISP (CUSTOM PROCEDURE TRAY) ×2 IMPLANT
PENCIL SMOKE EVACUATOR (MISCELLANEOUS) ×2 IMPLANT
SUT MNCRL AB 4-0 PS2 18 (SUTURE) ×2 IMPLANT
SUT VIC AB 3-0 SH 18 (SUTURE) ×2 IMPLANT
SYR BULB IRRIG 60ML STRL (SYRINGE) ×2 IMPLANT
SYR CONTROL 10ML LL (SYRINGE) ×2 IMPLANT
TOWEL OR 17X26 10 PK STRL BLUE (TOWEL DISPOSABLE) ×2 IMPLANT
TOWEL OR NON WOVEN STRL DISP B (DISPOSABLE) ×2 IMPLANT
TUBING CONNECTING 10 (TUBING) ×2 IMPLANT

## 2021-09-20 NOTE — Anesthesia Procedure Notes (Signed)
Procedure Name: Intubation Date/Time: 09/20/2021 10:26 AM Performed by: Cynda Familia, CRNA Pre-anesthesia Checklist: Patient identified, Emergency Drugs available, Suction available and Patient being monitored Patient Re-evaluated:Patient Re-evaluated prior to induction Oxygen Delivery Method: Circle System Utilized Preoxygenation: Pre-oxygenation with 100% oxygen Induction Type: IV induction Ventilation: Mask ventilation without difficulty Laryngoscope Size: Miller and 2 Grade View: Grade I Tube type: Oral Number of attempts: 1 Airway Equipment and Method: Stylet Placement Confirmation: ETT inserted through vocal cords under direct vision, positive ETCO2 and breath sounds checked- equal and bilateral Secured at: 22 cm Tube secured with: Tape Dental Injury: Teeth and Oropharynx as per pre-operative assessment  Comments: Smooth IV induction Howze- intubation AM CRNA - atraumatic- teeth and mouth as preop - left front tooth with chipping- pt did not report preop- unchanged with laryngosopy- bilat BS Howze

## 2021-09-20 NOTE — Discharge Instructions (Signed)
[facility] - Dr. Danae Oland  THYROID & PARATHYROID SURGERY:  POST-OP INSTRUCTIONS  Always review the instruction sheet provided by the hospital nurse at discharge.  A prescription for pain medication may be sent to your pharmacy at the time of discharge.  Take your pain medication as prescribed.  If narcotic pain medicine is not needed, then you may take acetaminophen (Tylenol) or ibuprofen (Advil) as needed for pain or soreness.  Take your normal home medications as prescribed unless otherwise directed.  If you need a refill on your pain medication, please contact the office during regular business hours.  Prescriptions will not be processed by the office after 5:00PM or on weekends.  Start with a light diet upon arrival home, such as soup and crackers or toast.  Be sure to drink plenty of fluids.  Resume your normal diet the day after surgery.  Most patients will experience some swelling and bruising on the chest and neck area.  Ice packs will help for the first 48 hours after arriving home.  Swelling and bruising will take several days to resolve.   It is common to experience some constipation after surgery.  Increasing fluid intake and taking a stool softener (Colace) will usually help to prevent this problem.  A mild laxative (Milk of Magnesia or Miralax) should be taken according to package directions if there has been no bowel movement after 48 hours.  Dermabond glue covers your incision. This seals the wound and you may shower at any time. The Dermabond will remain in place for about a week.  You may gradually remove the glue when it loosens around the edges.  If you need to loosen the Dermabond for removal, apply a layer of Vaseline to the wound for 15 minutes and then remove with a Kleenex. Your sutures are under the skin and will not show - they will dissolve on their own.  You may resume light daily activities beginning the day after discharge (such as self-care,  walking, climbing stairs), gradually increasing activities as tolerated. You may have sexual intercourse when it is comfortable. Refrain from any heavy lifting or straining until approved by your doctor. You may drive when you no longer are taking prescription pain medication, you can comfortably wear a seatbelt, and you can safely maneuver your car and apply the brakes.  You will see your doctor in the office for a follow-up appointment approximately three weeks after your surgery.  Make sure that you call for this appointment within a day or two after you arrive home to insure a convenient appointment time. Please have any requested laboratory tests performed a few days prior to your office visit so that the results will be available at your follow up appointment.  WHEN TO CALL THE [facility] OFFICE: -- Fever greater than 101.5 -- Inability to urinate -- Nausea and/or vomiting - persistent -- Extreme swelling or bruising -- Continued bleeding from incision -- Increased pain, redness, or drainage from the incision -- Difficulty swallowing or breathing -- Muscle cramping or spasms -- Numbness or tingling in hands or around lips  The clinic staff is available to answer your questions during regular business hours.  Please don't hesitate to call and ask to speak to one of the nurses if you have concerns.  [facility] OFFICE: [phone] (24 hours)  Please sign up for MyChart accounts. This will allow you to communicate directly with my nurse or myself without having to call the office. It will also allow you   to view your test results. You will need to enroll in MyChart for my office ([facility]) and for the hospital ([place]).  Marckus Hanover, MD [facility] practice 

## 2021-09-20 NOTE — Anesthesia Procedure Notes (Signed)
Date/Time: 09/20/2021 11:38 AM Performed by: Cynda Familia, CRNA Oxygen Delivery Method: Simple face mask Placement Confirmation: positive ETCO2 and breath sounds checked- equal and bilateral Dental Injury: Teeth and Oropharynx as per pre-operative assessment

## 2021-09-20 NOTE — Transfer of Care (Signed)
Immediate Anesthesia Transfer of Care Note  Patient: Hector [name]  Procedure(s) Performed: RIGHT INFERIOR PARATHYROIDECTOMY (Right: Neck)  Patient Location: PACU  Anesthesia Type:General  Level of Consciousness: sedated  Airway & Oxygen Therapy: Patient Spontanous Breathing and Patient connected to face mask oxygen  Post-op Assessment: Report given to RN and Post -op Vital signs reviewed and stable  Post vital signs: Reviewed and stable  Last Vitals:  Vitals Value Taken Time  BP 110/68 09/20/21 1145  Temp    Pulse 64 09/20/21 1148  Resp 11 09/20/21 1148  SpO2 100 % 09/20/21 1148  Vitals shown include unvalidated device data.  Last Pain:  Vitals:   09/20/21 0819  TempSrc: Oral         Complications: No notable events documented.

## 2021-09-20 NOTE — Interval H&P Note (Signed)
History and Physical Interval Note:  09/20/2021 9:08 AM  Hector [name]  has presented today for surgery, with the diagnosis of PRIMARY HYPERPARATHYROIDISM.  The various methods of treatment have been discussed with the patient and family. After consideration of risks, benefits and other options for treatment, the patient has consented to    Procedure(s) with comments: RIGHT INFERIOR PARATHYROIDECTOMY (Right) - 77 as a surgical intervention.    The patient's history has been reviewed, patient examined, no change in status, stable for surgery.  I have reviewed the patient's chart and labs.  Questions were answered to the patient's satisfaction.    Armandina Gemma, [facility] [place] practice Office: [place]

## 2021-09-20 NOTE — Anesthesia Postprocedure Evaluation (Signed)
Anesthesia Post Note  Patient: Hector [name]  Procedure(s) Performed: RIGHT INFERIOR PARATHYROIDECTOMY (Right: Neck)     Patient location during evaluation: PACU Anesthesia Type: General Level of consciousness: awake and alert and oriented Pain management: pain level controlled Vital Signs Assessment: post-procedure vital signs reviewed and stable Respiratory status: spontaneous breathing, nonlabored ventilation and respiratory function stable Cardiovascular status: blood pressure returned to baseline Postop Assessment: no apparent nausea or vomiting Anesthetic complications: no   No notable events documented.  Last Vitals:  Vitals:   09/20/21 1200 09/20/21 1215  BP: 106/68 110/70  Pulse: 67 71  Resp: 13 15  Temp:    SpO2: 99% 93%    Last Pain:  Vitals:   09/20/21 1215  TempSrc:   PainSc: 2                  Marthenia Rolling

## 2021-09-20 NOTE — Op Note (Signed)
Operative Note  Pre-operative Diagnosis:  primary hyperparathyroidism   Post-operative Diagnosis:  same  Surgeon:  Armandina Gemma, MD  Assistant:  none   Procedure:  right parathyroidectomy (2 glands)  Anesthesia:  general  Estimated Blood Loss:  minimal  Drains: none         Specimen: An enlarged parathyroid gland representing adenoma from the right inferior para-esophageal location.  A normal appearing parathyroid gland adjacent to the inferior pole of the right lobe of the thyroid.  Indications:  Patient is referred from the [facility] for surgical evaluation and management of suspected primary hyperparathyroidism. Patient's primary care physician is Dr. Pricilla Holm. Patient's endocrinologist is Dr. Renato Shin. Patient had first been noted to have hypercalcemia approximately 3 to 4 years ago. Complications have included nephrolithiasis and bone and joint discomfort and muscle discomfort and muscle weakness. He denies any significant fatigue. He has had no recent fractures. Laboratory studies demonstrate an elevated calcium level ranging from 10.3-10.6. Intact PTH level is elevated from 110 to a recent level of 194. 24-hour urine collection in the past has been elevated as high as 304. Patient has had a normal vitamin D level. He is also undergone bone density scanning with normal results.  Patient underwent nuclear medicine parathyroid scan which localized a right inferior parathyroid adenoma.  Patient now comes to surgery for parathyroidectomy.  Procedure:  The patient was seen in the pre-op holding area. The risks, benefits, complications, treatment options, and expected outcomes were previously discussed with the patient. The patient agreed with the proposed plan and has signed the informed consent form.  The patient was brought to the operating room by the surgical team, identified as Lissa Morales and the procedure verified. A "time out" was  completed and the above information confirmed.  Following induction of general anesthesia the patient was positioned and then prepped and draped in the usual aseptic fashion.  After ascertaining that an adequate level of anesthesia been achieved, a right anterior neck incision is made with a #15 blade.  Dissection is carried through subcutaneous tissues and platysma.  Skin flaps are developed circumferentially and a Weitlander retractor placed for exposure.  Strap muscles are incised in the midline and reflected to the right exposing a normal-appearing right thyroid lobe.  Exploration reveals a glandular structure at the inferior pole of the right thyroid lobe which appears to be parathyroid tissue.  It is minimally enlarged and slightly lobular.  It is gently dissected out.  Vascular structures are divided between ligaclips and the gland is excised and placed in saline on the back table.  Further exploration is carried more deeply into the space adjacent to the esophagus.  Dissection is carried back to the cervical fascia.  Exploration is carried cephalad and caudad.  Inferiorly there is a enlarged glandular tissue mass consistent with a parathyroid adenoma.  This is mobilized.  Care is taken to avoid the adjacent recurrent nerve.  Vascular pedicle was dissected out and divided between ligaclips.  The gland is excised.  Both specimens are submitted to pathology.  The larger gland from adjacent to the esophagus proves to be a parathyroid adenoma.  The small gland from the inferior pole of the thyroid lobe appears to be normal parathyroid tissue.  Neck is irrigated with warm saline.  Good hemostasis is noted throughout.  Fibrillar is placed throughout the operative field.  Strap muscles are reapproximated in the midline of interrupted 3-0 Vicryl sutures.  Platysma is closed with interrupted  3-0 Vicryl sutures.  Skin is anesthetized with local anesthetic.  Skin edges are closed with a running 4-0 Monocryl  subcuticular suture.  Wound is washed and dried and Dermabond is applied as dressing.  Patient is awakened from anesthesia and transferred to the recovery room in stable condition.  The patient tolerated the procedure well.   Armandina Gemma, [facility] [facility] Office: [phone]

## 2021-09-21 ENCOUNTER — Encounter (HOSPITAL_COMMUNITY): Payer: Self-pay | Admitting: Surgery

## 2021-09-21 LAB — SURGICAL PATHOLOGY

## 2021-09-23 DIAGNOSIS — S098XXA Other specified injuries of head, initial encounter: Secondary | ICD-10-CM | POA: Diagnosis not present

## 2021-09-23 DIAGNOSIS — Y838 Other surgical procedures as the cause of abnormal reaction of the patient, or of later complication, without mention of misadventure at the time of the procedure: Secondary | ICD-10-CM | POA: Diagnosis not present

## 2021-09-23 DIAGNOSIS — Q386 Other congenital malformations of mouth: Secondary | ICD-10-CM | POA: Diagnosis not present

## 2021-10-12 DIAGNOSIS — E892 Postprocedural hypoparathyroidism: Secondary | ICD-10-CM | POA: Diagnosis not present

## 2021-10-26 ENCOUNTER — Ambulatory Visit: Payer: Medicare Other | Admitting: Internal Medicine

## 2021-11-25 ENCOUNTER — Other Ambulatory Visit: Payer: Self-pay

## 2021-11-25 ENCOUNTER — Ambulatory Visit: Payer: Medicare Other

## 2021-12-06 ENCOUNTER — Ambulatory Visit (INDEPENDENT_AMBULATORY_CARE_PROVIDER_SITE_OTHER): Payer: Medicare Other

## 2021-12-06 ENCOUNTER — Other Ambulatory Visit: Payer: Self-pay

## 2021-12-06 DIAGNOSIS — Z0000 Encounter for general adult medical examination without abnormal findings: Secondary | ICD-10-CM | POA: Diagnosis not present

## 2021-12-06 NOTE — Progress Notes (Signed)
I connected with Hector [name] today by telephone and verified that I am speaking with the correct person using two identifiers. Location patient: home Location provider: work Persons participating in the virtual visit: patient, provider.   I discussed the limitations, risks, security and privacy concerns of performing an evaluation and management service by telephone and the availability of in person appointments. I also discussed with the patient that there may be a patient responsible charge related to this service. The patient expressed understanding and verbally consented to this telephonic visit.    Interactive audio and video telecommunications were attempted between this provider and patient, however failed, due to patient having technical difficulties OR patient did not have access to video capability.  We continued and completed visit with audio only.  Some vital signs may be absent or patient reported.   Time Spent with patient on telephone encounter: 40 minutes  Subjective:   Hector [name] is a 71 y.o. male who presents for Medicare Annual/Subsequent preventive examination.  Review of Systems     Cardiac Risk Factors include: advanced age (>23men, >26 women);dyslipidemia;family history of premature cardiovascular disease;hypertension;male gender     Objective:    There were no vitals filed for this visit. There is no height or weight on file to calculate BMI.  Advanced Directives 12/06/2021 09/20/2021 09/16/2021 12/03/2020 09/16/2020 09/13/2020 03/07/2018  Does Patient Have a Medical Advance Directive? Yes Yes Yes No - Yes No  Type of Advance Directive Living will;Healthcare Power of [place];Living will [place];Living will - - [facility];Living will -  Does patient want to make changes to medical advance directive? No - Patient declined - - - - - -  Copy of [place] in Chart? No - copy  requested No - copy requested No - copy requested - - - -  Would patient like information on creating a medical advance directive? - - - No - Patient declined No - Patient declined - -    Current Medications (verified) Outpatient Encounter Medications as of 12/06/2021  Medication Sig   amLODipine (NORVASC) 5 MG tablet Take 5 mg by mouth daily.   aspirin EC 81 MG tablet Take 81 mg by mouth daily. Swallow whole.   cetirizine (ZYRTEC) 10 MG tablet Take 10 mg by mouth daily.   cholecalciferol (VITAMIN D3) 25 MCG (1000 UNIT) tablet Take 1,000 Units by mouth daily.   fluticasone (FLONASE) 50 MCG/ACT nasal spray Place 2 sprays into both nostrils daily.   furosemide (LASIX) 40 MG tablet Take 1 tablet (40 mg total) by mouth daily. (Patient taking differently: Take 40 mg by mouth at bedtime.)   loratadine (CLARITIN) 10 MG tablet Take 10 mg by mouth daily.   losartan (COZAAR) 100 MG tablet TAKE 1 TABLET DAILY (Patient taking differently: Take 100 mg by mouth at bedtime.)   lovastatin (MEVACOR) 20 MG tablet Take 20 mg by mouth at bedtime.   meloxicam (MOBIC) 15 MG tablet Take 15 mg by mouth daily.    pantoprazole (PROTONIX) 40 MG tablet TAKE 1 TABLET DAILY   tamsulosin (FLOMAX) 0.4 MG CAPS capsule Take 1 capsule (0.4 mg total) by mouth daily. (Patient not taking: Reported on 09/13/2021)   terazosin (HYTRIN) 10 MG capsule Take 10 mg by mouth at bedtime.    triamcinolone cream (KENALOG) 0.1 % APPLY TOPICALLY TWICE A DAY ([place]. MUST SEE PROVIDER FOR FUTURE REFILLS) (Patient taking differently: Apply 1 application topically  daily as needed ([place]).)   [DISCONTINUED] traMADol (ULTRAM) 50 MG tablet Take 1-2 tablets (50-100 mg total) by mouth every 6 (six) hours as needed for moderate pain. (Patient not taking: Reported on 12/06/2021)   No facility-administered encounter medications on file as of 12/06/2021.    Allergies (verified) Lisinopril and Shellfish allergy   History: Past  Medical History:  Diagnosis Date   Adhesive capsulitis 07/31/2018   Allergy    Arthritis    Bladder cancer ([place])    Chronic kidney disease    kidney stones   GERD (gastroesophageal reflux disease)    History of kidney stones    Hyperlipidemia    Hyperparathyroidism ([place])    Hypertension    Past Surgical History:  Procedure Laterality Date   COLONOSCOPY     CYSTOSCOPY N/A 09/16/2020   Procedure: CYSTOSCOPY;  Surgeon: Franchot Gallo, MD;  Location: [facility];  Service: Urology;  Laterality: N/A;   PARATHYROIDECTOMY Right 09/20/2021   Procedure: RIGHT INFERIOR PARATHYROIDECTOMY;  Surgeon: Armandina Gemma, MD;  Location: [facility];  Service: General;  Laterality: Right;  77   POLYPECTOMY     PROSTATE SURGERY  12/2012   no cancer   ROTATOR CUFF REPAIR Right 2019   TRANSURETHRAL RESECTION OF BLADDER NECK N/A 09/16/2020   Procedure: TRANSURETHRAL INCISION OF BLADDER NECK CONTRACTURE;  Surgeon: Franchot Gallo, MD;  Location: [facility];  Service: Urology;  Laterality: N/A;  57 MINS   TRANSURETHRAL RESECTION OF BLADDER TUMOR WITH MITOMYCIN-C N/A 09/16/2020   Procedure: TRANSURETHRAL RESECTION OF BLADDER TUMOR WITH post op  GEMCITABINE;  Surgeon: Franchot Gallo, MD;  Location: [facility];  Service: Urology;  Laterality: N/A;   WISDOM TOOTH EXTRACTION     Family History  Problem Relation Age of Onset   Arthritis Mother    Hyperlipidemia Mother    Heart disease Mother    Stomach cancer Mother    Colon polyps Mother    Arthritis Father    Hyperlipidemia Father    Heart disease Father    Kidney disease Father    Breast cancer Sister    Hypertension Sister    Ovarian cancer Sister    Gallbladder disease Sister        x 6   Colon cancer Neg Hx    Liver cancer Neg Hx    Rectal cancer Neg Hx    Esophageal cancer Neg Hx    Social History   Socioeconomic History   Marital status: Married    Spouse name: Not on file   Number  of children: 2   Years of education: 16   Highest education level: Not on file  Occupational History   Occupation: retired  Tobacco Use   Smoking status: Never   Smokeless tobacco: Never  Vaping Use   Vaping Use: Never used  Substance and Sexual Activity   Alcohol use: No    Alcohol/week: 0.0 standard drinks   Drug use: No   Sexual activity: Not on file  Other Topics Concern   Not on file  Social History Narrative   Not on file   Social Determinants of Health   Financial Resource Strain: Low Risk    Difficulty of Paying Living Expenses: Not hard at all  Food Insecurity: No Food Insecurity   Worried About Charity fundraiser in the Last Year: Never true   Ran Out of Food in the Last Year: Never true  Transportation Needs: No Transportation Needs   Lack  of Transportation (Medical): No   Lack of Transportation (Non-Medical): No  Physical Activity: Sufficiently Active   Days of Exercise per Week: 7 days   Minutes of Exercise per Session: 30 min  Stress: No Stress Concern Present   Feeling of Stress : Not at all  Social Connections: Socially Integrated   Frequency of Communication with Friends and Family: More than three times a week   Frequency of Social Gatherings with Friends and Family: More than three times a week   Attends Religious Services: More than 4 times per year   Active Member of [employer] or Organizations: Yes   Attends Music therapist: More than 4 times per year   Marital Status: Married    Tobacco Counseling Counseling given: Not Answered   Clinical Intake:  Pre-visit preparation completed: Yes  Pain : No/denies pain     Nutritional Risks: Other (Comment) Diabetes: No  How often do you need to have someone help you when you read instructions, pamphlets, or other written materials from your doctor or pharmacy?: 1 - Never What is the last grade level you completed in school?: Master's Degree  Diabetic? no  Interpreter Needed?:  No  Information entered by :: Lisette Abu, LPN   Activities of Daily Living In your present state of health, do you have any difficulty performing the following activities: 12/06/2021 09/16/2021  Hearing? N N  Vision? N N  Difficulty concentrating or making decisions? N N  Walking or climbing stairs? N N  Dressing or bathing? N N  Doing errands, shopping? N N  Preparing Food and eating ? N -  Using the Toilet? N -  In the past six months, have you accidently leaked urine? N -  Do you have problems with loss of bowel control? N -  Managing your Medications? N -  Managing your Finances? N -  Housekeeping or managing your Housekeeping? N -  Some recent data might be hidden    Patient Care Team: Hoyt Koch, MD as PCP - General (Internal Medicine)  Indicate any recent Medical Services you may have received from other than [facility] providers in the past year (date may be approximate).     Assessment:   This is a routine wellness examination for [facility].  Hearing/Vision screen Hearing Screening - Comments:: Patient denied any hearing difficulty.   No hearing aids.  Vision Screening - Comments:: Patient wears corrective glasses/contacts.  Eye exam done annually by: Marshall Cork, MD.  Dietary issues and exercise activities discussed: Current Exercise Habits: Home exercise routine, Type of exercise: walking;strength training/weights;stretching;treadmill, Time (Minutes): 30, Frequency (Times/Week): 7, Weekly Exercise (Minutes/Week): 210, Intensity: Moderate, Exercise limited by: None identified   Goals Addressed               This Visit's Progress     Patient Stated (pt-stated)        To maintain my current health status by continuing to eat healthy, stay physically active and socially active.      Depression Screen PHQ 2/9 Scores 12/06/2021 10/25/2020 05/15/2019 08/19/2018 09/13/2017  PHQ - 2 Score 0 0 0 0 0    Fall Risk Fall Risk  12/06/2021 10/25/2020  08/19/2018  Falls in the past year? 0 0 No  Number falls in past yr: 0 0 -  Injury with Fall? 0 0 -  Risk for fall due to : No Fall Risks - -  Follow up Falls evaluation completed - -    FALL RISK PREVENTION  PERTAINING TO THE HOME:  Any stairs in or around the home? Yes  If so, are there any without handrails? No  Home free of loose throw rugs in walkways, pet beds, electrical cords, etc? Yes  Adequate lighting in your home to reduce risk of falls? Yes   ASSISTIVE DEVICES UTILIZED TO PREVENT FALLS:  Life alert? No  Use of a cane, walker or w/c? No  Grab bars in the bathroom? No  Shower chair or bench in shower? Yes  Elevated toilet seat or a handicapped toilet? Yes   TIMED UP AND GO:  Was the test performed? No .  Length of time to ambulate 10 feet: n/a sec.   Gait steady and fast without use of assistive device (per patient)  Cognitive Function: Normal cognitive status assessed by direct observation by this Nurse Health Advisor. No abnormalities found.          Immunizations Immunization History  Administered Date(s) Administered   Fluad Quad(high Dose 65+) 08/21/2019, 10/07/2020, 09/07/2021   Influenza Split 09/03/2014, 08/04/2016, 09/03/2018   Influenza Whole 11/02/1998, 09/17/2014   Influenza, High Dose Seasonal PF 08/19/2018   Influenza,inj,Quad PF,6+ Mos 09/03/2015, 10/03/2016, 09/13/2017   Influenza-Unspecified 10/02/1997, 01/26/2005, 08/04/2005, 09/03/2005, 08/08/2019, 09/03/2020   Meningococcal Polysaccharide 03/31/1999   PFIZER(Purple Top)SARS-COV-2 Vaccination 01/06/2020, 01/29/2020, 08/10/2020, 08/28/2020   PPD Test 01/11/2015   Pneumococcal Conjugate-13 08/19/2018   Pneumococcal Polysaccharide-23 09/03/2020, 10/07/2020   Td 12/05/1995, 03/14/2007   Tdap 12/04/2008, 06/20/2019   Tetanus 12/04/2005   Typhoid Parenteral 03/31/1999   Yellow Fever 07/24/1997   Zoster Recombinat (Shingrix) 10/02/2017, 12/11/2017, 01/02/2019, 12/12/2019   Zoster, Live  11/06/2014    TDAP status: Up to date  Flu Vaccine status: Up to date  Pneumococcal vaccine status: Up to date  Covid-19 vaccine status: Completed vaccines  Qualifies for Shingles Vaccine? Yes   Zostavax completed Yes   Shingrix Completed?: Yes  Screening Tests Health Maintenance  Topic Date Due   COVID-19 Vaccine (5 - Booster) 10/23/2020   COLONOSCOPY (Pts 45-32yrs Insurance coverage will need to be confirmed)  03/07/2028   TETANUS/TDAP  06/19/2029   Pneumonia Vaccine 110+ Years old  Completed   INFLUENZA VACCINE  Completed   Hepatitis C Screening  Completed   Zoster Vaccines- Shingrix  Completed   HPV VACCINES  Aged Out    Health Maintenance  Health Maintenance Due  Topic Date Due   COVID-19 Vaccine (5 - Booster) 10/23/2020    Colorectal cancer screening: Type of screening: Colonoscopy. Completed 03/07/2018. Repeat every 10 years  Lung Cancer Screening: (Low Dose CT Chest recommended if Age 63-80 years, 30 pack-year currently smoking OR have quit w/in 15years.) does not qualify.   Lung Cancer Screening Referral: No  Additional Screening:  Hepatitis C Screening: does qualify; Completed: yes  Vision Screening: Recommended annual ophthalmology exams for early detection of glaucoma and other disorders of the eye. Is the patient up to date with their annual eye exam?  Yes  Who is the provider or what is the name of the office in which the patient attends annual eye exams? Marshall Cork, MD. If pt is not established with a provider, would they like to be referred to a provider to establish care? No .   Dental Screening: Recommended annual dental exams for proper oral hygiene  Community Resource Referral / Chronic Care Management: CRR required this visit?  No   CCM required this visit?  No      Plan:     I have personally  reviewed and noted the following in the patients chart:   Medical and social history Use of alcohol, tobacco or illicit drugs   Current medications and supplements including opioid prescriptions. Patient is not currently taking opioid prescriptions. Functional ability and status Nutritional status Physical activity Advanced directives List of other physicians Hospitalizations, surgeries, and ER visits in previous 12 months Vitals Screenings to include cognitive, depression, and falls Referrals and appointments  In addition, I have reviewed and discussed with patient certain preventive protocols, quality metrics, and best practice recommendations. A written personalized care plan for preventive services as well as general preventive health recommendations were provided to patient.     Sheral Flow, LPN   04/06/6502   Nurse Notes:  Patient is cogitatively intact. There were no vitals filed for this visit. There is no height or weight on file to calculate BMI.

## 2022-01-09 DIAGNOSIS — N401 Enlarged prostate with lower urinary tract symptoms: Secondary | ICD-10-CM | POA: Diagnosis not present

## 2022-01-09 DIAGNOSIS — R3914 Feeling of incomplete bladder emptying: Secondary | ICD-10-CM | POA: Diagnosis not present

## 2022-01-09 DIAGNOSIS — Z8551 Personal history of malignant neoplasm of bladder: Secondary | ICD-10-CM | POA: Diagnosis not present

## 2022-02-23 ENCOUNTER — Encounter: Payer: Self-pay | Admitting: Internal Medicine

## 2022-02-23 ENCOUNTER — Other Ambulatory Visit: Payer: Self-pay | Admitting: Internal Medicine

## 2022-03-06 ENCOUNTER — Encounter: Payer: Self-pay | Admitting: Internal Medicine

## 2022-03-09 DIAGNOSIS — N5089 Other specified disorders of the male genital organs: Secondary | ICD-10-CM | POA: Diagnosis not present

## 2022-03-22 ENCOUNTER — Encounter: Payer: Self-pay | Admitting: Internal Medicine

## 2022-03-23 ENCOUNTER — Ambulatory Visit: Payer: Medicare Other | Admitting: Family Medicine

## 2022-03-27 ENCOUNTER — Ambulatory Visit (INDEPENDENT_AMBULATORY_CARE_PROVIDER_SITE_OTHER): Payer: Medicare Other | Admitting: Internal Medicine

## 2022-03-27 ENCOUNTER — Encounter: Payer: Self-pay | Admitting: Internal Medicine

## 2022-03-27 VITALS — BP 114/80 | HR 62 | Resp 18 | Ht 68.0 in | Wt 208.0 lb

## 2022-03-27 DIAGNOSIS — I10 Essential (primary) hypertension: Secondary | ICD-10-CM | POA: Diagnosis not present

## 2022-03-27 DIAGNOSIS — K219 Gastro-esophageal reflux disease without esophagitis: Secondary | ICD-10-CM | POA: Diagnosis not present

## 2022-03-27 DIAGNOSIS — E782 Mixed hyperlipidemia: Secondary | ICD-10-CM

## 2022-03-27 DIAGNOSIS — R7301 Impaired fasting glucose: Secondary | ICD-10-CM | POA: Diagnosis not present

## 2022-03-27 DIAGNOSIS — E213 Hyperparathyroidism, unspecified: Secondary | ICD-10-CM

## 2022-03-27 LAB — COMPREHENSIVE METABOLIC PANEL
ALT: 25 U/L (ref 0–53)
AST: 22 U/L (ref 0–37)
Albumin: 4.2 g/dL (ref 3.5–5.2)
Alkaline Phosphatase: 67 U/L (ref 39–117)
BUN: 16 mg/dL (ref 6–23)
CO2: 31 mEq/L (ref 19–32)
Calcium: 9.1 mg/dL (ref 8.4–10.5)
Chloride: 103 mEq/L (ref 96–112)
Creatinine, Ser: 1.08 mg/dL (ref 0.40–1.50)
GFR: 70.15 mL/min (ref >60.00)
Glucose, Bld: 94 mg/dL (ref 70–99)
Potassium: 3.6 mEq/L (ref 3.5–5.1)
Sodium: 141 mEq/L (ref 135–145)
Total Bilirubin: 0.7 mg/dL (ref 0.2–1.2)
Total Protein: 7.2 g/dL (ref 6.0–8.3)

## 2022-03-27 LAB — CBC
HCT: 42.9 % (ref 39.0–52.0)
Hemoglobin: 14.1 g/dL (ref 13.0–17.0)
MCHC: 32.9 g/dL (ref 30.0–36.0)
MCV: 84.7 fl (ref 78.0–100.0)
Platelets: 180 10*3/uL (ref 150.0–400.0)
RBC: 5.06 Mil/uL (ref 4.22–5.81)
RDW: 14.8 % (ref 11.5–15.5)
WBC: 2.8 10*3/uL — ABNORMAL LOW (ref 4.0–10.5)

## 2022-03-27 LAB — LIPID PANEL
Cholesterol: 131 mg/dL (ref 0–200)
HDL: 48.6 mg/dL (ref >39.00)
LDL Cholesterol: 72 mg/dL (ref 0–99)
NonHDL: 82.78
Total CHOL/HDL Ratio: 3
Triglycerides: 52 mg/dL (ref 0.0–149.0)
VLDL: 10.4 mg/dL (ref 0.0–40.0)

## 2022-03-27 LAB — HEMOGLOBIN A1C: Hgb A1c MFr Bld: 5.9 % (ref 4.6–6.5)

## 2022-03-27 NOTE — Assessment & Plan Note (Signed)
BP at goal on amlodipine 5 mg daily and losartan 100 mg daily and lasix 40 mg daily. Checking CMP and adjust as needed.  ?

## 2022-03-27 NOTE — Assessment & Plan Note (Signed)
Checking CMP to assess calcium today. He is s/p surgery Oct 2022. Treat as appropriate.  ?

## 2022-03-27 NOTE — Assessment & Plan Note (Signed)
Taking protonix 40 mg daily and will continue. Symptoms are controlled.  ?

## 2022-03-27 NOTE — Progress Notes (Signed)
? ?  Subjective:  ? ?Patient ID: Hector [name], male    DOB: 1951/10/24, 71 y.o.   MRN: [id number] ? ?HPI ?The patient is a 71 YO man coming in for follow up. ? ?Review of Systems  ?Constitutional: Negative.   ?HENT: Negative.    ?Eyes: Negative.   ?Respiratory:  Negative for cough, chest tightness and shortness of breath.   ?Cardiovascular:  Negative for chest pain, palpitations and leg swelling.  ?Gastrointestinal:  Negative for abdominal distention, abdominal pain, constipation, diarrhea, nausea and vomiting.  ?Musculoskeletal: Negative.   ?Skin: Negative.   ?Neurological: Negative.   ?Psychiatric/Behavioral: Negative.    ? ?Objective:  ?Physical Exam ?Constitutional:   ?   Appearance: He is well-developed.  ?HENT:  ?   Head: Normocephalic and atraumatic.  ?Cardiovascular:  ?   Rate and Rhythm: Normal rate and regular rhythm.  ?Pulmonary:  ?   Effort: Pulmonary effort is normal. No respiratory distress.  ?   Breath sounds: Normal breath sounds. No wheezing or rales.  ?Abdominal:  ?   General: Bowel sounds are normal. There is no distension.  ?   Palpations: Abdomen is soft.  ?   Tenderness: There is no abdominal tenderness. There is no rebound.  ?Musculoskeletal:  ?   Cervical back: Normal range of motion.  ?Skin: ?   General: Skin is warm and dry.  ?Neurological:  ?   Mental Status: He is alert and oriented to person, place, and time.  ?   Coordination: Coordination normal.  ? ? ?Vitals:  ? 03/27/22 0913  ?BP: 114/80  ?Pulse: 62  ?Resp: 18  ?SpO2: 99%  ?Weight: 208 lb (94.3 kg)  ?Height: '5\' 8"'$  (1.727 m)  ? ? ?This visit occurred during the SARS-CoV-2 public health emergency.  Safety protocols were in place, including screening questions prior to the visit, additional usage of staff PPE, and extensive cleaning of exam room while observing appropriate contact time as indicated for disinfecting solutions.  ? ?Assessment & Plan:  ? ?

## 2022-03-27 NOTE — Patient Instructions (Signed)
We will check the labs today. 

## 2022-03-27 NOTE — Assessment & Plan Note (Signed)
Checking lipid panel and is taking lovastatin 20 mg daily. Adjust as needed for goal <100 LDL. ?

## 2022-05-14 ENCOUNTER — Other Ambulatory Visit: Payer: Self-pay | Admitting: Internal Medicine

## 2022-06-30 ENCOUNTER — Encounter: Payer: Self-pay | Admitting: Internal Medicine

## 2022-06-30 ENCOUNTER — Ambulatory Visit (INDEPENDENT_AMBULATORY_CARE_PROVIDER_SITE_OTHER)
Admission: RE | Admit: 2022-06-30 | Discharge: 2022-06-30 | Disposition: A | Discharge status: Home / Self Care | Payer: Medicare Other | Source: Ambulatory Visit | Attending: Internal Medicine | Admitting: Internal Medicine

## 2022-06-30 ENCOUNTER — Ambulatory Visit (INDEPENDENT_AMBULATORY_CARE_PROVIDER_SITE_OTHER): Payer: Medicare Other | Admitting: Internal Medicine

## 2022-06-30 VITALS — BP 126/84 | HR 69 | Resp 18 | Ht 68.0 in | Wt 213.8 lb

## 2022-06-30 DIAGNOSIS — G8929 Other chronic pain: Secondary | ICD-10-CM | POA: Diagnosis not present

## 2022-06-30 DIAGNOSIS — M79672 Pain in left foot: Secondary | ICD-10-CM

## 2022-06-30 DIAGNOSIS — M5442 Lumbago with sciatica, left side: Secondary | ICD-10-CM

## 2022-06-30 DIAGNOSIS — M1612 Unilateral primary osteoarthritis, left hip: Secondary | ICD-10-CM | POA: Diagnosis not present

## 2022-06-30 DIAGNOSIS — M25512 Pain in left shoulder: Secondary | ICD-10-CM

## 2022-06-30 DIAGNOSIS — M25562 Pain in left knee: Secondary | ICD-10-CM | POA: Diagnosis not present

## 2022-06-30 DIAGNOSIS — M545 Low back pain, unspecified: Secondary | ICD-10-CM | POA: Diagnosis not present

## 2022-06-30 MED ORDER — PREDNISONE 20 MG PO TABS: 40.0000 mg | ORAL_TABLET | Freq: Every day | ORAL | 0 refills | Status: DC

## 2022-06-30 NOTE — Assessment & Plan Note (Signed)
Prior x-ray reviewed with him. 2 recent falls and he did catch himself with arm. Could be strain or sprain of rotator cuff tendon. X-ray would not be helpful today referred to sports medicine for ultrasound guided evaluation and treatment as needed. Rx prednisone 5 day course to see if this helps.

## 2022-06-30 NOTE — Assessment & Plan Note (Signed)
With recent injury and some pain in the 1st toe. Ordered x-ray to rule out fracture.

## 2022-06-30 NOTE — Patient Instructions (Signed)
We will check the x-ray today of the hip, low back and foot. We will get you in with sports medicine about the shoulder.   We will try prednisone 5 day course to see if this helps temporarily

## 2022-06-30 NOTE — Progress Notes (Signed)
   Subjective:   Patient ID: Hector [name], male    DOB: [birth year], 71 y.o.   MRN: [id number]  HPI The patient is a 71 YO man coming in for multiple concerns.  Review of Systems  Constitutional: Negative.   HENT: Negative.    Eyes: Negative.   Respiratory:  Negative for cough, chest tightness and shortness of breath.   Cardiovascular:  Negative for chest pain, palpitations and leg swelling.  Gastrointestinal:  Negative for abdominal distention, abdominal pain, constipation, diarrhea, nausea and vomiting.  Musculoskeletal:  Positive for arthralgias, gait problem and myalgias.  Skin: Negative.   Neurological:  Positive for numbness.  Psychiatric/Behavioral: Negative.      Objective:  Physical Exam Constitutional:      Appearance: He is well-developed.  HENT:     Head: Normocephalic and atraumatic.  Cardiovascular:     Rate and Rhythm: Normal rate and regular rhythm.  Pulmonary:     Effort: Pulmonary effort is normal. No respiratory distress.     Breath sounds: Normal breath sounds. No wheezing or rales.  Abdominal:     General: Bowel sounds are normal. There is no distension.     Palpations: Abdomen is soft.     Tenderness: There is no abdominal tenderness. There is no rebound.  Musculoskeletal:        General: Tenderness present.     Cervical back: Normal range of motion.  Skin:    General: Skin is warm and dry.  Neurological:     Mental Status: He is alert and oriented to person, place, and time.     Coordination: Coordination normal.     Vitals:   06/30/22 1053  BP: 126/84  Pulse: 69  Resp: 18  SpO2: 94%  Weight: 213 lb 12.8 oz (97 kg)  Height: '5\' 8"'$  (1.727 m)    Assessment & Plan:

## 2022-06-30 NOTE — Assessment & Plan Note (Signed)
Concerning given 2 recent falls and some heaviness in the feet. He has low back pain and numbness with prolonged sitting or standing. This is chronic but worsening lately. X-ray ordered for lumbar and left hip as this is where his pain is located. May need referral to neurosurgeon. Rx prednisone 5 day course to see if this helps.

## 2022-07-05 NOTE — Progress Notes (Unsigned)
   I, Peterson Lombard, LAT, ATC acting as a scribe for Lynne Leader, MD.  Subjective:    CC: L shoulder pain  HPI: Pt is a 71 y/o male c/o L shoulder pain x /. Pt has suffered 2 recent falls catching himself with his L arm. Pt locates pain to   Neck pain: Radiates: UE numbness/tingling: UE weakness: Aggravates: Treatments tried: prednisone,   Dx imaging: 11/11/20 L shoulder XR  ??? Hip, L foot, lspine?  Pertinent review of Systems: ***  Relevant historical information: ***   Objective:   There were no vitals filed for this visit. General: Well Developed, well nourished, and in no acute distress.   MSK: ***  Lab and Radiology Results No results found for this or any previous visit (from the past 72 hour(s)). No results found.    Impression and Recommendations:    Assessment and Plan: 71 y.o. male with ***.  PDMP not reviewed this encounter. No orders of the defined types were placed in this encounter.  No orders of the defined types were placed in this encounter.   Discussed warning signs or symptoms. Please see discharge instructions. Patient expresses understanding.   ***

## 2022-07-06 ENCOUNTER — Ambulatory Visit (INDEPENDENT_AMBULATORY_CARE_PROVIDER_SITE_OTHER): Payer: Medicare Other

## 2022-07-06 ENCOUNTER — Ambulatory Visit: Payer: Medicare Other

## 2022-07-06 ENCOUNTER — Ambulatory Visit (INDEPENDENT_AMBULATORY_CARE_PROVIDER_SITE_OTHER): Payer: Medicare Other | Admitting: Family Medicine

## 2022-07-06 VITALS — BP 142/82 | HR 59 | Ht 68.0 in | Wt 214.0 lb

## 2022-07-06 DIAGNOSIS — G8929 Other chronic pain: Secondary | ICD-10-CM

## 2022-07-06 DIAGNOSIS — M542 Cervicalgia: Secondary | ICD-10-CM | POA: Diagnosis not present

## 2022-07-06 DIAGNOSIS — M19012 Primary osteoarthritis, left shoulder: Secondary | ICD-10-CM | POA: Diagnosis not present

## 2022-07-06 DIAGNOSIS — M25512 Pain in left shoulder: Secondary | ICD-10-CM

## 2022-07-06 DIAGNOSIS — W19XXXA Unspecified fall, initial encounter: Secondary | ICD-10-CM | POA: Diagnosis not present

## 2022-07-06 DIAGNOSIS — R202 Paresthesia of skin: Secondary | ICD-10-CM

## 2022-07-06 MED ORDER — GABAPENTIN 300 MG PO CAPS: 300.0000 mg | ORAL_CAPSULE | Freq: Three times a day (TID) | ORAL | 2 refills | Status: DC

## 2022-07-06 NOTE — Progress Notes (Signed)
Left shoulder x-ray shows some mild arthritis changes without significant change compared to prior x-ray from 2021.

## 2022-07-06 NOTE — Progress Notes (Signed)
Cervical spine x-ray shows arthritis changes.

## 2022-07-06 NOTE — Patient Instructions (Addendum)
Thank you for coming in today.   I've referred you to Physical Therapy.  Let us know if you don't hear from them in one week.   Please get an Xray today before you leave   Try wearing a cubital tunnel brace  I've sent a prescription for Gabapentin to your pharmacy.   Check back in 6 weeks.

## 2022-07-23 ENCOUNTER — Other Ambulatory Visit: Payer: Self-pay | Admitting: Internal Medicine

## 2022-07-24 ENCOUNTER — Other Ambulatory Visit: Payer: Self-pay

## 2022-07-24 ENCOUNTER — Encounter: Payer: Self-pay | Admitting: Rehabilitative and Restorative Service Providers"

## 2022-07-24 ENCOUNTER — Ambulatory Visit (INDEPENDENT_AMBULATORY_CARE_PROVIDER_SITE_OTHER): Payer: Medicare Other | Admitting: Rehabilitative and Restorative Service Providers"

## 2022-07-24 DIAGNOSIS — M5459 Other low back pain: Secondary | ICD-10-CM

## 2022-07-24 DIAGNOSIS — M6281 Muscle weakness (generalized): Secondary | ICD-10-CM

## 2022-07-24 DIAGNOSIS — G8929 Other chronic pain: Secondary | ICD-10-CM | POA: Diagnosis not present

## 2022-07-24 DIAGNOSIS — M25512 Pain in left shoulder: Secondary | ICD-10-CM

## 2022-07-24 DIAGNOSIS — R2689 Other abnormalities of gait and mobility: Secondary | ICD-10-CM | POA: Diagnosis not present

## 2022-07-24 NOTE — Therapy (Addendum)
OUTPATIENT PHYSICAL THERAPY EVALUATION   Patient Name: Hector [name] MRN: [id number] DOB:[birth year], 71 y.o., male Today's Date: 07/24/2022   PT End of Session - 07/24/22 1243     Visit Number 1    Number of Visits 20    Date for PT Re-Evaluation 10/02/22    Progress Note Due on Visit 10    PT Start Time 1300    PT Stop Time 1340    PT Time Calculation (min) 40 min    Activity Tolerance Patient tolerated treatment well    Behavior During Therapy [facility] for tasks assessed/performed             Past Medical History:  Diagnosis Date   Adhesive capsulitis 07/31/2018   Allergy    Arthritis    Bladder cancer ([place])    Chronic kidney disease    kidney stones   GERD (gastroesophageal reflux disease)    History of kidney stones    Hyperlipidemia    Hyperparathyroidism ([place])    Hypertension    Past Surgical History:  Procedure Laterality Date   COLONOSCOPY     CYSTOSCOPY N/A 09/16/2020   Procedure: CYSTOSCOPY;  Surgeon: Franchot Gallo, MD;  Location: [facility];  Service: Urology;  Laterality: N/A;   PARATHYROIDECTOMY Right 09/20/2021   Procedure: RIGHT INFERIOR PARATHYROIDECTOMY;  Surgeon: Armandina Gemma, MD;  Location: [facility];  Service: General;  Laterality: Right;  75   POLYPECTOMY     PROSTATE SURGERY  12/2012   no cancer   ROTATOR CUFF REPAIR Right 2019   TRANSURETHRAL RESECTION OF BLADDER NECK N/A 09/16/2020   Procedure: TRANSURETHRAL INCISION OF BLADDER NECK CONTRACTURE;  Surgeon: Franchot Gallo, MD;  Location: [facility];  Service: Urology;  Laterality: N/A;  69 MINS   TRANSURETHRAL RESECTION OF BLADDER TUMOR WITH MITOMYCIN-C N/A 09/16/2020   Procedure: TRANSURETHRAL RESECTION OF BLADDER TUMOR WITH post op  GEMCITABINE;  Surgeon: Franchot Gallo, MD;  Location: [facility];  Service: Urology;  Laterality: N/A;   WISDOM TOOTH EXTRACTION     Patient Active Problem List   Diagnosis Date Noted   Left foot pain  06/30/2022   Numbness and tingling of both legs 07/25/2021   Chronic left shoulder pain 11/12/2020   Chronic right shoulder pain 10/08/2018   Chronic 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 03/24/2016   Hyperparathyroidism ([place]) 04/13/2015   Vitamin D deficiency 04/09/2015   Osteoarthritis 11/06/2014   Essential hypertension 11/06/2014   Hyperlipidemia 11/06/2014   GERD (gastroesophageal reflux disease) 11/06/2014   Syncope 11/06/2014   Routine general medical examination at a health care facility 11/06/2014    PCP: Hoyt Koch MD  REFERRING PROVIDER: Gregor Hams, MD  REFERRING DIAG: [phone] (ICD-10-CM) - Chronic left shoulder pain  THERAPY DIAG:  Chronic left shoulder pain  Other low back pain  Muscle weakness (generalized)  Other abnormalities of gait and mobility  Rationale for Evaluation and Treatment Rehabilitation  ONSET DATE: April 2023   SUBJECTIVE:  SUBJECTIVE STATEMENT: Recently had two falls with [place] onto Lt arm.   Pt indicated having complaints c lifting, reaching in Lt shoulder currently as well as lifting/grip.     Pt indicated feeling leg numbness and tingling and swelling occurs with prolonged walking.   Pt indicated resting helps after about 30 minutes.  Pt indicated those complaints since April/May.  Can also notice complaints c prolonged sitting/standing as well. History of fracture of tailbone.   Back complaints noted with sitting/standing, history of osteoporosis.   Standing/sitting limited to mins prior to worsening.  Lt leg noted more than Rt.    Pt indicated history of Lt shoulder pain since 1987 and worsened from falls.   Denied numbness/tingling in saddle area, denied bowel symptoms.   PERTINENT HISTORY: History of Adhesive capsulitis Lt  shoulder, Rt shoulder rotator cuff with good results around 2019 (therapy was performed) CKD/stones, hyperlipidemia, HTN  PAIN:  NPRS scale: 8/10 at worst, current 5-6/10 Pain location: Lt shoulder  Pain description: constant ache Aggravating factors: lifting, reaching, grip, sharp pain at times.  Relieving factors: trying to avoid those movements  NPRS scale: 10/10, current 5/10 Pain location: back  Pain description: constant ache/tightness, sharp pain at times Aggravating factors: prolonged standing/walking, sitting Relieving factors: lying down    PRECAUTIONS: None  WEIGHT BEARING RESTRICTIONS No  FALLS:  Has patient fallen in last 6 months? Yes. Number of falls 2 No fear of falling indicated   LIVING ENVIRONMENT: Lives with: lives with their family Lives in: House/apartment Stairs: 2 steps to enter c handrails   OCCUPATION: No work  PLOF:  independent, Rt handed , walking for exercise, light lifting weights.  Yard work  PATIENT GOALS :  No pain, reaching without shoulder trouble, be able to sit /stand for longer periods of time.   OBJECTIVE:   DIAGNOSTIC FINDINGS:  07/24/2022: review of 07/06/2022 MD note:  [facility] Ultrasound of: Left shoulder  Biceps tendon is intact and normal appearing Subscapularis tendon is intact and normal appearing Supraspinatus tendon is intact with mild subacromial bursitis present. Infraspinatus tendon is intact and normal appearing AC joint degenerative appearing Impression: Subacromial bursitis  C-spine: DDD worse at C4-5 and C6-7.  No acute fractures.   Left shoulder: AC DJD mild.  No acute fractures.  No severe glenohumeral DJD.  PATIENT SURVEYS:  07/24/2022 FOTO intake:  56   predicted:  66  COGNITION: 07/24/2022  Overall cognitive status: WFL     SENSATION: 07/24/2022 Plan to check in future (light touch for LE, possible point discrimination)   POSTURE: 07/24/2022 Unremarkable in standing.    LUMBAR: 07/24/2022: Lumbar flexion:  To ankles without pain complaints.  Lumbar extension:  ERP lumbar and into posterior Lt buttock/thigh, 50 %.     Repeated x 5 in standing, improved to 75% c simliar ERP Lumbar lateral flexion Lt to knee joint without pain Lumbar lateral flexion Rt to knee joint without pain   UPPER EXTREMITY ROM:   Active ROM Right 07/24/2022 Left 07/24/2022  Shoulder flexion  WFL in supine.  AROM to approx. 90 % WFL c pain from mid range, seated against gravity  Shoulder extension    Shoulder abduction  WFL in supine  Shoulder adduction    Shoulder internal rotation    Shoulder external rotation  70 deg in 45 deg abduction  Elbow flexion    Elbow extension    Wrist flexion    Wrist extension    Wrist ulnar deviation  Wrist radial deviation    Wrist pronation    Wrist supination    (Blank rows = not tested)  MMT:  MMT Right 07/24/2022 Left 07/24/2022  Shoulder flexion 5/5 3+/5 c pain  Shoulder extension    Shoulder abduction 5/5 3+/5 c pain  Shoulder adduction    Shoulder internal rotation 5/5 4/5  Shoulder external rotation 5/5 4/5 c pain  Middle trapezius    Lower trapezius    Elbow flexion    Elbow extension        Hip flexion    Hip abduction    Hip extension    Knee flexion    Knee extension    Ankle DF    Ankle PF    Ankle inversion    Ankle eversion    (Blank rows = not tested)  SPECIAL TESTS: 07/24/2022  (+) Painful arc Lt, (-) drop arm, (-) crossed SLR bilateral  BALANCE TESTING: 07/24/2022  [facility] PT Assessment - 07/24/22 0001       Standardized Balance Assessment   Standardized Balance Assessment Berg Balance Test      Berg Balance Test   Sit to Stand Able to stand without using hands and stabilize independently    Standing Unsupported Able to stand safely 2 minutes    Sitting with Back Unsupported but Feet Supported on Floor or Stool Able to sit safely and securely 2 minutes    Stand to Sit Sits safely with  minimal use of hands    Transfers Able to transfer safely, minor use of hands    Standing Unsupported with Eyes Closed Able to stand 10 seconds with supervision    Standing Unsupported with Feet Together Able to place feet together independently and stand 1 minute safely    From Standing, Reach Forward with Outstretched Arm Can reach confidently >25 cm (10")    From Standing Position, Pick up Object from Floor Able to pick up shoe safely and easily    From Standing Position, Turn to Look Behind Over each Shoulder Looks behind from both sides and weight shifts well    Turn 360 Degrees Able to turn 360 degrees safely in 4 seconds or less    Standing Unsupported, Alternately Place Feet on Step/Stool Able to stand independently and safely and complete 8 steps in 20 seconds    Standing Unsupported, One Foot in Front Able to take small step independently and hold 30 seconds    Standing on One Leg Able to lift leg independently and hold 5-10 seconds    Total Score 52             JOINT MOBILITY TESTING:  07/24/2022 None performed today  PALPATION:  07/24/2022 Tenderness in Lt upper trap, infraspinatus, anterior jt line/superior Joint Lt shoulder.    TODAY'S TREATMENT:  07/24/2022 Therex:    HEP instruction/performance c cues for techniques, handout provided.  Trial set performed of each for comprehension and symptom assessment.  See below for exercise list    PATIENT EDUCATION: Education details: HEP, POC Person educated: Patient Education method: Explanation, Demonstration, Verbal cues, and Handouts Education comprehension: verbalized understanding, returned demonstration, and verbal cues required    HOME EXERCISE PROGRAM: Access Code: [email or web address] URL: [email or web address] Date: 07/24/2022 Prepared by: Scot Jun  Exercises - Supine Lower Trunk Rotation  - 2-3 x daily - 7 x weekly - 1 sets - 3-5 reps - 15 hold - Standing Lumbar Extension  - 2-3 x daily - 7 x  weekly - 1 sets -  5-10 reps - Standing Shoulder Row with Anchored Resistance  - 1-2 x daily - 7 x weekly - 1-2 sets - 10-15 reps - Shoulder Extension with Resistance  - 1-2 x daily - 7 x weekly - 1-2 sets - 10-15 reps - Shoulder External Rotation and Scapular Retraction with Resistance  - 1-2 x daily - 7 x weekly - 1-2 sets - 10 reps - Tandem Stance  - 1-2 x daily - 7 x weekly - 3 sets - 10 reps  ASSESSMENT:  CLINICAL IMPRESSION: Patient is a 71 y.o. who comes to clinic with complaints of Lt shoulder pain with mobility, strength and movement coordination deficits that impair their ability to perform usual daily and recreational functional activities without increase difficulty/symptoms at this time.  Patient to benefit from skilled PT services to address impairments and limitations to improve to previous level of function without restriction secondary to condition.    OBJECTIVE IMPAIRMENTS decreased activity tolerance, decreased balance, decreased mobility, difficulty walking, decreased ROM, decreased strength, hypomobility, increased fascial restrictions, impaired perceived functional ability, impaired flexibility, impaired UE functional use, improper body mechanics, and pain.   ACTIVITY LIMITATIONS carrying, lifting, bending, sitting, standing, squatting, sleeping, transfers, and locomotion level, reaching overhead  PARTICIPATION LIMITATIONS: interpersonal relationship, community activity, yard work, and exercise  PERSONAL FACTORS Time since onset of injury/illness/exacerbation CKD/stones, hyperlipidemia, HTN, multiple treatment areas are also affecting patient's functional outcome.   REHAB POTENTIAL: Good  CLINICAL DECISION MAKING: Stable/uncomplicated  EVALUATION COMPLEXITY: Low   GOALS: Goals reviewed with patient? Yes  Short term PT Goals (target date for Short term goals are 3 weeks 08/14/2022) Patient will demonstrate independent use of home exercise program to maintain progress  from in clinic treatments. Goal status: New   Long term PT goals (target dates for all long term goals are 10 weeks  10/02/2022 )   1. Patient will demonstrate/report pain at worst less than or equal to 2/10 to facilitate minimal limitation in daily activity secondary to pain symptoms. Goal status: New   2. Patient will demonstrate independent use of home exercise program to facilitate ability to maintain/progress functional gains from skilled physical therapy services. Goal status: New   3. Patient will demonstrate FOTO outcome > or = 66 % to indicate reduced disability due to condition. Goal status: New   4.  Patient will demonstrate Lt UE MMT 5/5 throughout to facilitate lifting, reaching, carrying at [facility] in daily activity.   Goal status: New   5.  Patient will demonstrate Lt Hoffman joint AROM WFL against gravity s symptoms to facilitate usual overhead reaching, self care, dressing at PLOF.     Goal status: New   6.  Patient will demonstrate tandem stance > 30 sec bilateral, SLS > 15 second bilateral for improved stability in ambulation.   Goal status: New       PLAN: PT FREQUENCY: 1-2x/week  PT DURATION: 10 weeks  PLANNED INTERVENTIONS: Therapeutic exercises, Therapeutic activity, Neuro Muscular re-education, Balance training, Gait training, Patient/Family education, Joint mobilization, Stair training, DME instructions, Dry Needling, Electrical stimulation, Cryotherapy, Moist heat, Taping, Traction, Ultrasound, Ionotophoresis '4mg'$ /ml Dexamethasone, and Manual therapy.  All included unless contraindicated   PLAN FOR NEXT SESSION: Review HEP knowledge/results.    Complaint surface balance, UE strengthening.  Check on LE numbness response.     Scot Jun, PT, DPT, OCS, ATC 07/24/22  2:20 PM

## 2022-07-31 DIAGNOSIS — R3914 Feeling of incomplete bladder emptying: Secondary | ICD-10-CM | POA: Diagnosis not present

## 2022-07-31 DIAGNOSIS — N5201 Erectile dysfunction due to arterial insufficiency: Secondary | ICD-10-CM | POA: Diagnosis not present

## 2022-07-31 DIAGNOSIS — N401 Enlarged prostate with lower urinary tract symptoms: Secondary | ICD-10-CM | POA: Diagnosis not present

## 2022-07-31 DIAGNOSIS — Z8551 Personal history of malignant neoplasm of bladder: Secondary | ICD-10-CM | POA: Diagnosis not present

## 2022-08-04 ENCOUNTER — Encounter: Payer: Medicare Other | Admitting: Rehabilitative and Restorative Service Providers"

## 2022-08-04 ENCOUNTER — Ambulatory Visit (INDEPENDENT_AMBULATORY_CARE_PROVIDER_SITE_OTHER): Payer: Medicare Other | Admitting: Rehabilitative and Restorative Service Providers"

## 2022-08-04 ENCOUNTER — Encounter: Payer: Self-pay | Admitting: Rehabilitative and Restorative Service Providers"

## 2022-08-04 DIAGNOSIS — M25512 Pain in left shoulder: Secondary | ICD-10-CM | POA: Diagnosis not present

## 2022-08-04 DIAGNOSIS — R2689 Other abnormalities of gait and mobility: Secondary | ICD-10-CM

## 2022-08-04 DIAGNOSIS — G8929 Other chronic pain: Secondary | ICD-10-CM

## 2022-08-04 DIAGNOSIS — M5459 Other low back pain: Secondary | ICD-10-CM | POA: Diagnosis not present

## 2022-08-04 DIAGNOSIS — M6281 Muscle weakness (generalized): Secondary | ICD-10-CM | POA: Diagnosis not present

## 2022-08-04 NOTE — Therapy (Signed)
OUTPATIENT PHYSICAL THERAPY TREATMENT   Patient Name: Hector [name] MRN: [id number] DOB:[birth year], 71 y.o., male Today's Date: 08/04/2022  END OF SESSION:   PT End of Session - 08/04/22 1007     Visit Number 2    Number of Visits 20    Date for PT Re-Evaluation 10/02/22    Progress Note Due on Visit 10    PT Start Time 2500    PT Stop Time 1101    PT Time Calculation (min) 46 min    Activity Tolerance Patient tolerated treatment well    Behavior During Therapy [facility] for tasks assessed/performed              Past Medical History:  Diagnosis Date   Adhesive capsulitis 07/31/2018   Allergy    Arthritis    Bladder cancer ([place])    Chronic kidney disease    kidney stones   GERD (gastroesophageal reflux disease)    History of kidney stones    Hyperlipidemia    Hyperparathyroidism ([place])    Hypertension    Past Surgical History:  Procedure Laterality Date   COLONOSCOPY     CYSTOSCOPY N/A 09/16/2020   Procedure: CYSTOSCOPY;  Surgeon: Franchot Gallo, MD;  Location: [facility];  Service: Urology;  Laterality: N/A;   PARATHYROIDECTOMY Right 09/20/2021   Procedure: RIGHT INFERIOR PARATHYROIDECTOMY;  Surgeon: Armandina Gemma, MD;  Location: [facility];  Service: General;  Laterality: Right;  75   POLYPECTOMY     PROSTATE SURGERY  12/2012   no cancer   ROTATOR CUFF REPAIR Right 2019   TRANSURETHRAL RESECTION OF BLADDER NECK N/A 09/16/2020   Procedure: TRANSURETHRAL INCISION OF BLADDER NECK CONTRACTURE;  Surgeon: Franchot Gallo, MD;  Location: [facility];  Service: Urology;  Laterality: N/A;  75 MINS   TRANSURETHRAL RESECTION OF BLADDER TUMOR WITH MITOMYCIN-C N/A 09/16/2020   Procedure: TRANSURETHRAL RESECTION OF BLADDER TUMOR WITH post op  GEMCITABINE;  Surgeon: Franchot Gallo, MD;  Location: [facility];  Service: Urology;  Laterality: N/A;   WISDOM TOOTH EXTRACTION     Patient Active Problem List   Diagnosis Date Noted    Left foot pain 06/30/2022   Numbness and tingling of both legs 07/25/2021   Chronic left shoulder pain 11/12/2020   Chronic right shoulder pain 10/08/2018   Chronic 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 03/24/2016   Hyperparathyroidism ([place]) 04/13/2015   Vitamin D deficiency 04/09/2015   Osteoarthritis 11/06/2014   Essential hypertension 11/06/2014   Hyperlipidemia 11/06/2014   GERD (gastroesophageal reflux disease) 11/06/2014   Syncope 11/06/2014   Routine general medical examination at a health care facility 11/06/2014    PCP: Hoyt Koch MD  REFERRING PROVIDER: Gregor Hams, MD  REFERRING DIAG: [phone] (ICD-10-CM) - Chronic left shoulder pain  THERAPY DIAG:  Chronic left shoulder pain  Other low back pain  Muscle weakness (generalized)  Other abnormalities of gait and mobility  Rationale for Evaluation and Treatment Rehabilitation  ONSET DATE: April 2023   SUBJECTIVE:  SUBJECTIVE STATEMENT: Pt indicated some hip and legs throbbing upon arrival today.  Pt indicated shoulder not as bad today but still can come down into finger.  Walked this morning several miles so numbness occurred.  Reported short term improvements with symptoms.   PERTINENT HISTORY: History of Adhesive capsulitis Lt shoulder, Rt shoulder rotator cuff with good results around 2019 (therapy was performed) CKD/stones, hyperlipidemia, HTN  PAIN:  NPRS scale: 3/10 Pain location: Lt shoulder  Pain description: constant ache Aggravating factors: lifting, reaching, grip, sharp pain at times.  Relieving factors: trying to avoid those movements  NPRS scale: 7/10 upon arrival Pain location: back /hip Pain description: constant ache/tightness, sharp pain at times Aggravating factors: prolonged  standing/walking, sitting Relieving factors: lying down    PRECAUTIONS: None  WEIGHT BEARING RESTRICTIONS No  FALLS:  Has patient fallen in last 6 months? Yes. Number of falls 2 No fear of falling indicated   LIVING ENVIRONMENT: Lives with: lives with their family Lives in: House/apartment Stairs: 2 steps to enter c handrails   OCCUPATION: No work  PLOF:  independent, Rt handed , walking for exercise, light lifting weights.  Yard work  PATIENT GOALS :  No pain, reaching without shoulder trouble, be able to sit /stand for longer periods of time.   OBJECTIVE:   DIAGNOSTIC FINDINGS:  07/24/2022: review of 07/06/2022 MD note:  [facility] Ultrasound of: Left shoulder  Biceps tendon is intact and normal appearing Subscapularis tendon is intact and normal appearing Supraspinatus tendon is intact with mild subacromial bursitis present. Infraspinatus tendon is intact and normal appearing AC joint degenerative appearing Impression: Subacromial bursitis  C-spine: DDD worse at C4-5 and C6-7.  No acute fractures.   Left shoulder: AC DJD mild.  No acute fractures.  No severe glenohumeral DJD.  PATIENT SURVEYS:  07/24/2022 FOTO intake:  56   predicted:  66  COGNITION: 07/24/2022  Overall cognitive status: WFL     SENSATION: 07/24/2022 Plan to check in future (light touch for LE, possible point discrimination)   POSTURE: 07/24/2022 Unremarkable in standing.   LUMBAR: 08/04/2022:  Lumbar extension 100 % WFL c mild end range symptoms  07/24/2022: Lumbar flexion:  To ankles without pain complaints.  Lumbar extension:  ERP lumbar and into posterior Lt buttock/thigh, 50 %.     Repeated x 5 in standing, improved to 75% c simliar ERP Lumbar lateral flexion Lt to knee joint without pain Lumbar lateral flexion Rt to knee joint without pain   UPPER EXTREMITY ROM:   Active ROM Right 07/24/2022 Left 07/24/2022  Shoulder flexion  WFL in supine.  AROM to approx. 90 % WFL  c pain from mid range, seated against gravity  Shoulder extension    Shoulder abduction  WFL in supine  Shoulder adduction    Shoulder internal rotation    Shoulder external rotation  70 deg in 45 deg abduction  Elbow flexion    Elbow extension    Wrist flexion    Wrist extension    Wrist ulnar deviation    Wrist radial deviation    Wrist pronation    Wrist supination    (Blank rows = not tested)  MMT:  MMT Right 07/24/2022 Left 07/24/2022 Left 08/04/2022  Shoulder flexion 5/5 3+/5 c pain   Shoulder extension     Shoulder abduction 5/5 3+/5 c pain   Shoulder adduction     Shoulder internal rotation 5/5 4/5   Shoulder external rotation 5/5 4/5 c pain 4/5  Middle  trapezius     Lower trapezius     Elbow flexion     Elbow extension          Hip flexion     Hip abduction     Hip extension     Knee flexion     Knee extension     Ankle DF     Ankle PF     Ankle inversion     Ankle eversion     (Blank rows = not tested)  SPECIAL TESTS: 08/04/2022:    (+) ULTT 2A Lt arm, positive improvement c cervical distraction, (-) spurling Lt and Rt for radicular symptoms.   07/24/2022  (+) Painful arc Lt, (-) drop arm, (-) crossed SLR bilateral  BALANCE TESTING: 07/24/2022   [facility] PT Assessment - 07/24/22 0001       Standardized Balance Assessment   Standardized Balance Assessment Berg Balance Test      Berg Balance Test   Sit to Stand Able to stand without using hands and stabilize independently    Standing Unsupported Able to stand safely 2 minutes    Sitting with Back Unsupported but Feet Supported on Floor or Stool Able to sit safely and securely 2 minutes    Stand to Sit Sits safely with minimal use of hands    Transfers Able to transfer safely, minor use of hands    Standing Unsupported with Eyes Closed Able to stand 10 seconds with supervision    Standing Unsupported with Feet Together Able to place feet together independently and stand 1 minute safely    From Standing,  Reach Forward with Outstretched Arm Can reach confidently >25 cm (10")    From Standing Position, Pick up Object from Floor Able to pick up shoe safely and easily    From Standing Position, Turn to Look Behind Over each Shoulder Looks behind from both sides and weight shifts well    Turn 360 Degrees Able to turn 360 degrees safely in 4 seconds or less    Standing Unsupported, Alternately Place Feet on Step/Stool Able to stand independently and safely and complete 8 steps in 20 seconds    Standing Unsupported, One Foot in Front Able to take small step independently and hold 30 seconds    Standing on One Leg Able to lift leg independently and hold 5-10 seconds    Total Score 52             JOINT MOBILITY TESTING:  07/24/2022 None performed today  PALPATION:  07/24/2022 Tenderness in Lt upper trap, infraspinatus, anterior jt line/superior Joint Lt shoulder.    TODAY'S TREATMENT:  08/04/2022: Therex: Nustep Lvl 6 6 mins UE/LE Upper limb nerve flossing for median nerve (server to arm extension looking at palm Lt arm) x 10 (cues for home use)  Manual: Upper limb nerve flossing ulnar in ULTT 2a Lt arm positioning Cervical distraction manually    Neuro Re-ed Tandem ambulation on foam in bars fwd 6 ft x 10  Standing   Mechanical Traction 18/15 lbs intermittent 60/20 sec holds 10 mins   07/24/2022 Therex:    HEP instruction/performance c cues for techniques, handout provided.  Trial set performed of each for comprehension and symptom assessment.  See below for exercise list   PATIENT EDUCATION: 07/24/2022 Education details: HEP, POC Person educated: Patient Education method: Explanation, Demonstration, Verbal cues, and Handouts Education comprehension: verbalized understanding, returned demonstration, and verbal cues required   HOME EXERCISE PROGRAM: Access Code: [email or web address] URL: [email or web address] Date:  07/24/2022 Prepared by: Scot Jun  Exercises -  Supine Lower Trunk Rotation  - 2-3 x daily - 7 x weekly - 1 sets - 3-5 reps - 15 hold - Standing Lumbar Extension  - 2-3 x daily - 7 x weekly - 1 sets - 5-10 reps - Standing Shoulder Row with Anchored Resistance  - 1-2 x daily - 7 x weekly - 1-2 sets - 10-15 reps - Shoulder Extension with Resistance  - 1-2 x daily - 7 x weekly - 1-2 sets - 10-15 reps - Shoulder External Rotation and Scapular Retraction with Resistance  - 1-2 x daily - 7 x weekly - 1-2 sets - 10 reps - Tandem Stance  - 1-2 x daily - 7 x weekly - 3 sets - 10 reps  ASSESSMENT:  CLINICAL IMPRESSION: Assessment today included possible involvement of cervical compression on nerve as well as upper limb tension testing positive in relationship to Lt arm symptoms.  Pt indicated cervical distraction was positive for improvement for neck and back related complaints so performed today and will reassess for use in future.  Screening of balance seemed to implicate proprioceptive limitations vs. Visual/vestibular as head positioning didn't cause increased difficulty.   Continued skilled PT services indicated at this time.    OBJECTIVE IMPAIRMENTS decreased activity tolerance, decreased balance, decreased mobility, difficulty walking, decreased ROM, decreased strength, hypomobility, increased fascial restrictions, impaired perceived functional ability, impaired flexibility, impaired UE functional use, improper body mechanics, and pain.   ACTIVITY LIMITATIONS carrying, lifting, bending, sitting, standing, squatting, sleeping, transfers, and locomotion level, reaching overhead  PARTICIPATION LIMITATIONS: interpersonal relationship, community activity, yard work, and exercise  PERSONAL FACTORS Time since onset of injury/illness/exacerbation CKD/stones, hyperlipidemia, HTN, multiple treatment areas are also affecting patient's functional outcome.   REHAB POTENTIAL: Good  CLINICAL DECISION MAKING: Stable/uncomplicated  EVALUATION COMPLEXITY:  Low   GOALS: Goals reviewed with patient? Yes  Short term PT Goals (target date for Short term goals are 3 weeks 08/14/2022) Patient will demonstrate independent use of home exercise program to maintain progress from in clinic treatments. Goal status: on going - assessed 08/04/2022   Long term PT goals (target dates for all long term goals are 10 weeks  10/02/2022 )   1. Patient will demonstrate/report pain at worst less than or equal to 2/10 to facilitate minimal limitation in daily activity secondary to pain symptoms. Goal status: New   2. Patient will demonstrate independent use of home exercise program to facilitate ability to maintain/progress functional gains from skilled physical therapy services. Goal status: New   3. Patient will demonstrate FOTO outcome > or = 66 % to indicate reduced disability due to condition. Goal status: New   4.  Patient will demonstrate Lt UE MMT 5/5 throughout to facilitate lifting, reaching, carrying at [facility] in daily activity.   Goal status: New   5.  Patient will demonstrate Lt Reserve joint AROM WFL against gravity s symptoms to facilitate usual overhead reaching, self care, dressing at PLOF.     Goal status: New   6.  Patient will demonstrate tandem stance > 30 sec bilateral, SLS > 15 second bilateral for improved stability in ambulation.   Goal status: New       PLAN: PT FREQUENCY: 1-2x/week  PT DURATION: 10 weeks  PLANNED INTERVENTIONS: Therapeutic exercises, Therapeutic activity, Neuro Muscular re-education, Balance training, Gait training, Patient/Family education, Joint mobilization, Stair training, DME instructions, Dry Needling, Electrical stimulation, Cryotherapy, Moist heat, Taping, Traction, Ultrasound, Ionotophoresis '4mg'$ /ml Dexamethasone, and Manual  therapy.  All included unless contraindicated   PLAN FOR NEXT SESSION: Check on traction results and use of limb flossing.  Focus on back improvement/treatment based off symptoms.     Scot Jun, PT, DPT, OCS, ATC 08/04/22  11:11 AM

## 2022-08-04 NOTE — Addendum Note (Signed)
Addended by: Scot Jun B on: 08/04/2022 11:13 AM   Modules accepted: Orders

## 2022-08-08 ENCOUNTER — Encounter: Payer: Self-pay | Admitting: Rehabilitative and Restorative Service Providers"

## 2022-08-08 ENCOUNTER — Ambulatory Visit (INDEPENDENT_AMBULATORY_CARE_PROVIDER_SITE_OTHER): Payer: Medicare Other | Admitting: Rehabilitative and Restorative Service Providers"

## 2022-08-08 DIAGNOSIS — R2689 Other abnormalities of gait and mobility: Secondary | ICD-10-CM | POA: Diagnosis not present

## 2022-08-08 DIAGNOSIS — M6281 Muscle weakness (generalized): Secondary | ICD-10-CM | POA: Diagnosis not present

## 2022-08-08 DIAGNOSIS — M25512 Pain in left shoulder: Secondary | ICD-10-CM

## 2022-08-08 DIAGNOSIS — M5459 Other low back pain: Secondary | ICD-10-CM | POA: Diagnosis not present

## 2022-08-08 DIAGNOSIS — G8929 Other chronic pain: Secondary | ICD-10-CM

## 2022-08-08 NOTE — Therapy (Signed)
OUTPATIENT PHYSICAL THERAPY TREATMENT   Patient Name: Hector [name] MRN: [id number] DOB:[birth year], 71 y.o., male Today's Date: 08/08/2022  END OF SESSION:   PT End of Session - 08/08/22 1125     Visit Number 3    Number of Visits 20    Date for PT Re-Evaluation 10/02/22    Progress Note Due on Visit 10    PT Start Time 1101    PT Stop Time 1141    PT Time Calculation (min) 40 min    Activity Tolerance Patient tolerated treatment well    Behavior During Therapy [facility] for tasks assessed/performed               Past Medical History:  Diagnosis Date   Adhesive capsulitis 07/31/2018   Allergy    Arthritis    Bladder cancer ([place])    Chronic kidney disease    kidney stones   GERD (gastroesophageal reflux disease)    History of kidney stones    Hyperlipidemia    Hyperparathyroidism ([place])    Hypertension    Past Surgical History:  Procedure Laterality Date   COLONOSCOPY     CYSTOSCOPY N/A 09/16/2020   Procedure: CYSTOSCOPY;  Surgeon: Franchot Gallo, MD;  Location: [facility];  Service: Urology;  Laterality: N/A;   PARATHYROIDECTOMY Right 09/20/2021   Procedure: RIGHT INFERIOR PARATHYROIDECTOMY;  Surgeon: Armandina Gemma, MD;  Location: [facility];  Service: General;  Laterality: Right;  75   POLYPECTOMY     PROSTATE SURGERY  12/2012   no cancer   ROTATOR CUFF REPAIR Right 2019   TRANSURETHRAL RESECTION OF BLADDER NECK N/A 09/16/2020   Procedure: TRANSURETHRAL INCISION OF BLADDER NECK CONTRACTURE;  Surgeon: Franchot Gallo, MD;  Location: [facility];  Service: Urology;  Laterality: N/A;  75 MINS   TRANSURETHRAL RESECTION OF BLADDER TUMOR WITH MITOMYCIN-C N/A 09/16/2020   Procedure: TRANSURETHRAL RESECTION OF BLADDER TUMOR WITH post op  GEMCITABINE;  Surgeon: Franchot Gallo, MD;  Location: [facility];  Service: Urology;  Laterality: N/A;   WISDOM TOOTH EXTRACTION     Patient Active Problem List   Diagnosis Date  Noted   Left foot pain 06/30/2022   Numbness and tingling of both legs 07/25/2021   Chronic left shoulder pain 11/12/2020   Chronic right shoulder pain 10/08/2018   Chronic 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 03/24/2016   Hyperparathyroidism ([place]) 04/13/2015   Vitamin D deficiency 04/09/2015   Osteoarthritis 11/06/2014   Essential hypertension 11/06/2014   Hyperlipidemia 11/06/2014   GERD (gastroesophageal reflux disease) 11/06/2014   Syncope 11/06/2014   Routine general medical examination at a health care facility 11/06/2014    PCP: Hoyt Koch MD  REFERRING PROVIDER: Gregor Hams, MD  REFERRING DIAG: [phone] (ICD-10-CM) - Chronic left shoulder pain  THERAPY DIAG:  Chronic left shoulder pain  Other low back pain  Muscle weakness (generalized)  Other abnormalities of gait and mobility  Rationale for Evaluation and Treatment Rehabilitation  ONSET DATE: April 2023   SUBJECTIVE:  SUBJECTIVE STATEMENT: Pt indicated back was feeling better.  Still has complaints in legs after a mile or so of walking.  Also reported minimal change in Lt arm symptoms.  That was only complaint upon arrival today.   PERTINENT HISTORY: History of Adhesive capsulitis Lt shoulder, Rt shoulder rotator cuff with good results around 2019 (therapy was performed) CKD/stones, hyperlipidemia, HTN  PAIN:  NPRS scale: 0/10 Pain location: Lt shoulder  Pain description: constant ache Aggravating factors: lifting, reaching, grip, sharp pain at times.  Relieving factors: trying to avoid those movements  NPRS scale: 0/10 Pain location: back /hip Pain description: constant ache/tightness, sharp pain at times Aggravating factors: prolonged standing/walking, sitting Relieving factors: lying  down    PRECAUTIONS: None  WEIGHT BEARING RESTRICTIONS No  FALLS:  Has patient fallen in last 6 months? Yes. Number of falls 2 No fear of falling indicated   LIVING ENVIRONMENT: Lives with: lives with their family Lives in: House/apartment Stairs: 2 steps to enter c handrails   OCCUPATION: No work  PLOF:  independent, Rt handed , walking for exercise, light lifting weights.  Yard work  PATIENT GOALS :  No pain, reaching without shoulder trouble, be able to sit /stand for longer periods of time.   OBJECTIVE:   DIAGNOSTIC FINDINGS:  07/24/2022: review of 07/06/2022 MD note:  [facility] Ultrasound of: Left shoulder  Biceps tendon is intact and normal appearing Subscapularis tendon is intact and normal appearing Supraspinatus tendon is intact with mild subacromial bursitis present. Infraspinatus tendon is intact and normal appearing AC joint degenerative appearing Impression: Subacromial bursitis  C-spine: DDD worse at C4-5 and C6-7.  No acute fractures.   Left shoulder: AC DJD mild.  No acute fractures.  No severe glenohumeral DJD.  PATIENT SURVEYS:  07/24/2022 FOTO intake:  56   predicted:  66  COGNITION: 07/24/2022  Overall cognitive status: WFL     SENSATION: 07/24/2022 Plan to check in future (light touch for LE, possible point discrimination)   POSTURE: 07/24/2022 Unremarkable in standing.   LUMBAR: 08/04/2022:  Lumbar extension 100 % WFL c mild end range symptoms  07/24/2022: Lumbar flexion:  To ankles without pain complaints.  Lumbar extension:  ERP lumbar and into posterior Lt buttock/thigh, 50 %.     Repeated x 5 in standing, improved to 75% c simliar ERP Lumbar lateral flexion Lt to knee joint without pain Lumbar lateral flexion Rt to knee joint without pain   UPPER EXTREMITY ROM:   Active ROM Right 07/24/2022 Left 07/24/2022 Left 08/08/2022  Shoulder flexion  WFL in supine.  AROM to approx. 90 % WFL c pain from mid range, seated  against gravity   Shoulder extension     Shoulder abduction  WFL in supine   Shoulder adduction     Shoulder internal rotation     Shoulder external rotation  70 deg in 45 deg abduction 78 in 45 deg abduction  Elbow flexion     Elbow extension     Wrist flexion     Wrist extension     Wrist ulnar deviation     Wrist radial deviation     Wrist pronation     Wrist supination     (Blank rows = not tested)  MMT:  MMT Right 07/24/2022 Left 07/24/2022 Left 08/04/2022  Shoulder flexion 5/5 3+/5 c pain   Shoulder extension     Shoulder abduction 5/5 3+/5 c pain   Shoulder adduction     Shoulder internal rotation 5/5  4/5   Shoulder external rotation 5/5 4/5 c pain 4/5  Middle trapezius     Lower trapezius     Elbow flexion     Elbow extension          Hip flexion     Hip abduction     Hip extension     Knee flexion     Knee extension     Ankle DF     Ankle PF     Ankle inversion     Ankle eversion     (Blank rows = not tested)  SPECIAL TESTS: 08/04/2022:    (+) ULTT 2A Lt arm, positive improvement c cervical distraction, (-) spurling Lt and Rt for radicular symptoms.   07/24/2022  (+) Painful arc Lt, (-) drop arm, (-) crossed SLR bilateral  BALANCE TESTING: 07/24/2022   [facility] PT Assessment - 07/24/22 0001       Standardized Balance Assessment   Standardized Balance Assessment Berg Balance Test      Berg Balance Test   Sit to Stand Able to stand without using hands and stabilize independently    Standing Unsupported Able to stand safely 2 minutes    Sitting with Back Unsupported but Feet Supported on Floor or Stool Able to sit safely and securely 2 minutes    Stand to Sit Sits safely with minimal use of hands    Transfers Able to transfer safely, minor use of hands    Standing Unsupported with Eyes Closed Able to stand 10 seconds with supervision    Standing Unsupported with Feet Together Able to place feet together independently and stand 1 minute safely    From  Standing, Reach Forward with Outstretched Arm Can reach confidently >25 cm (10")    From Standing Position, Pick up Object from Floor Able to pick up shoe safely and easily    From Standing Position, Turn to Look Behind Over each Shoulder Looks behind from both sides and weight shifts well    Turn 360 Degrees Able to turn 360 degrees safely in 4 seconds or less    Standing Unsupported, Alternately Place Feet on Step/Stool Able to stand independently and safely and complete 8 steps in 20 seconds    Standing Unsupported, One Foot in Front Able to take small step independently and hold 30 seconds    Standing on One Leg Able to lift leg independently and hold 5-10 seconds    Total Score 52             JOINT MOBILITY TESTING:  07/24/2022 None performed today  PALPATION:  08/08/2022:  palpation to trigger points in Lt infraspinatus revealed concordant UE symptoms.   07/24/2022 Tenderness in Lt upper trap, infraspinatus, anterior jt line/superior Joint Lt shoulder.    TODAY'S TREATMENT:  08/08/2022: Therex: Standing blue band rows c scapular retraction x 20 Standing blue band Gh ext x 20 Standing blue band ER slow control focus 2 x 10 c towel at side bilateral   Manual: Upper limb nerve flossing ulnar in ULTT 2a Lt arm positioning Compression c movement to Lt infraspinatus for trigger point release  Mechanical Traction 18/13 lbs intermittent 60/20 sec holds 15 mins   08/04/2022: Therex: Nustep Lvl 6 6 mins UE/LE Upper limb nerve flossing for median nerve (server to arm extension looking at palm Lt arm) x 10 (cues for home use)  Manual: Upper limb nerve flossing ulnar in ULTT 2a Lt arm positioning Cervical distraction manually    Neuro Re-ed  Tandem ambulation on foam in bars fwd 6 ft x 10  Standing   Mechanical Traction 18/15 lbs intermittent 60/20 sec holds 10 mins   07/24/2022 Therex:    HEP instruction/performance c cues for techniques, handout provided.  Trial set  performed of each for comprehension and symptom assessment.  See below for exercise list   PATIENT EDUCATION: 07/24/2022 Education details: HEP, POC Person educated: Patient Education method: Explanation, Demonstration, Verbal cues, and Handouts Education comprehension: verbalized understanding, returned demonstration, and verbal cues required   HOME EXERCISE PROGRAM: Access Code: [email or web address] URL: [email or web address] Date: 07/24/2022 Prepared by: Scot Jun  Exercises - Supine Lower Trunk Rotation  - 2-3 x daily - 7 x weekly - 1 sets - 3-5 reps - 15 hold - Standing Lumbar Extension  - 2-3 x daily - 7 x weekly - 1 sets - 5-10 reps - Standing Shoulder Row with Anchored Resistance  - 1-2 x daily - 7 x weekly - 1-2 sets - 10-15 reps - Shoulder Extension with Resistance  - 1-2 x daily - 7 x weekly - 1-2 sets - 10-15 reps - Shoulder External Rotation and Scapular Retraction with Resistance  - 1-2 x daily - 7 x weekly - 1-2 sets - 10 reps - Tandem Stance  - 1-2 x daily - 7 x weekly - 3 sets - 10 reps  ASSESSMENT:  CLINICAL IMPRESSION: Positive reports on back pain from last visit to today.  Limb tension testing improved today c reduced symptoms noted in 2A testing for Lt arm.  Trigger points in Lt infraspinatus are likely part of presentation of Lt arm symptoms as well.  Treated today with trigger point release compression per Pt request with positive reduction in symptoms post interveniton.    OBJECTIVE IMPAIRMENTS decreased activity tolerance, decreased balance, decreased mobility, difficulty walking, decreased ROM, decreased strength, hypomobility, increased fascial restrictions, impaired perceived functional ability, impaired flexibility, impaired UE functional use, improper body mechanics, and pain.   ACTIVITY LIMITATIONS carrying, lifting, bending, sitting, standing, squatting, sleeping, transfers, and locomotion level, reaching overhead  PARTICIPATION LIMITATIONS:  interpersonal relationship, community activity, yard work, and exercise  PERSONAL FACTORS Time since onset of injury/illness/exacerbation CKD/stones, hyperlipidemia, HTN, multiple treatment areas are also affecting patient's functional outcome.   REHAB POTENTIAL: Good  CLINICAL DECISION MAKING: Stable/uncomplicated  EVALUATION COMPLEXITY: Low   GOALS: Goals reviewed with patient? Yes  Short term PT Goals (target date for Short term goals are 3 weeks 08/14/2022) Patient will demonstrate independent use of home exercise program to maintain progress from in clinic treatments. Goal status: on going - assessed 08/04/2022   Long term PT goals (target dates for all long term goals are 10 weeks  10/02/2022 )   1. Patient will demonstrate/report pain at worst less than or equal to 2/10 to facilitate minimal limitation in daily activity secondary to pain symptoms. Goal status: New   2. Patient will demonstrate independent use of home exercise program to facilitate ability to maintain/progress functional gains from skilled physical therapy services. Goal status: New   3. Patient will demonstrate FOTO outcome > or = 66 % to indicate reduced disability due to condition. Goal status: New   4.  Patient will demonstrate Lt UE MMT 5/5 throughout to facilitate lifting, reaching, carrying at [facility] in daily activity.   Goal status: New   5.  Patient will demonstrate Lt [place] joint AROM WFL against gravity s symptoms to facilitate usual overhead reaching, self care, dressing at PLOF.     Goal  status: New   6.  Patient will demonstrate tandem stance > 30 sec bilateral, SLS > 15 second bilateral for improved stability in ambulation.   Goal status: New       PLAN: PT FREQUENCY: 1-2x/week  PT DURATION: 10 weeks  PLANNED INTERVENTIONS: Therapeutic exercises, Therapeutic activity, Neuro Muscular re-education, Balance training, Gait training, Patient/Family education, Joint mobilization, Stair  training, DME instructions, Dry Needling, Electrical stimulation, Cryotherapy, Moist heat, Taping, Traction, Ultrasound, Ionotophoresis '4mg'$ /ml Dexamethasone, and Manual therapy.  All included unless contraindicated   PLAN FOR NEXT SESSION: Traction as desired.  Trigger point release techniques /follow up .    Scot Jun, PT, DPT, OCS, ATC 08/08/22  11:41 AM

## 2022-08-15 ENCOUNTER — Encounter: Payer: Medicare Other | Admitting: Rehabilitative and Restorative Service Providers"

## 2022-08-16 ENCOUNTER — Other Ambulatory Visit: Payer: Self-pay | Admitting: Internal Medicine

## 2022-08-16 ENCOUNTER — Ambulatory Visit (INDEPENDENT_AMBULATORY_CARE_PROVIDER_SITE_OTHER): Payer: Medicare Other

## 2022-08-16 DIAGNOSIS — Z23 Encounter for immunization: Secondary | ICD-10-CM

## 2022-08-16 NOTE — Progress Notes (Signed)
After obtaining consent, and per orders of Dr. Sharlet Salina, injection of Flu given by Marrian Salvage. Patient instructed to remain in clinic for 20 minutes afterwards, and to report any adverse reaction to me immediately.

## 2022-08-16 NOTE — Progress Notes (Signed)
   I, Peterson Lombard, LAT, ATC acting as a scribe for Lynne Leader, MD.  Hector [name] is a 71 y.o. male who presents to [place] at [facility] today for f/u L shoulder pain w/ distal paresthesias ongoing since the end of June. Pt has suffered 2 recent falls catching himself with his L arm, FOOSH mechanism. Pt reports the he is not picking his feet up when he's walking, which has caused the falls. Pt was last seen by Dr. Georgina Snell on 07/06/22 and was referred to PT to work on his gait and L shoulder pain, completing 2 visits. Today, pt reports L shoulder is feeling much better, 100% better. Pt notes great AROM and no more radicular symptoms.   Dx imaging: 07/06/22  L shoulder & c-spine XR 11/11/20 L shoulder XR  Pertinent review of systems: No fevers or chills  Relevant historical information: Hypertension   Exam:  BP (!) 162/88   Pulse (!) 52   Ht '5\' 8"'$  (1.727 m)   Wt 216 lb (98 kg)   SpO2 97%   BMI 32.84 kg/m  General: Well Developed, well nourished, and in no acute distress.   MSK: Left shoulder: Normal-appearing Normal shoulder motion and strength. Negative impingement testing.      Assessment and Plan: 71 y.o. male with left shoulder pain improved with physical therapy.  The upper extremity paresthesias involve little improved.  He has 1 more physical therapy session scheduled for September 19.  He should probably keep that session and focus more on home exercise program planning.  Reviewed potential next steps in treatment plans and options.  Recheck back as needed.    Discussed warning signs or symptoms. Please see discharge instructions. Patient expresses understanding.   The above documentation has been reviewed and is accurate and complete Lynne Leader, M.D.

## 2022-08-17 ENCOUNTER — Ambulatory Visit (INDEPENDENT_AMBULATORY_CARE_PROVIDER_SITE_OTHER): Payer: Medicare Other | Admitting: Family Medicine

## 2022-08-17 VITALS — BP 162/88 | HR 52 | Ht 68.0 in | Wt 216.0 lb

## 2022-08-17 DIAGNOSIS — G8929 Other chronic pain: Secondary | ICD-10-CM

## 2022-08-17 DIAGNOSIS — M25512 Pain in left shoulder: Secondary | ICD-10-CM

## 2022-08-17 NOTE — Patient Instructions (Addendum)
Thank you for coming in today.   Recheck as needed.   Let me know if you need more PT in the future.

## 2022-08-22 ENCOUNTER — Ambulatory Visit (INDEPENDENT_AMBULATORY_CARE_PROVIDER_SITE_OTHER): Payer: Medicare Other | Admitting: Rehabilitative and Restorative Service Providers"

## 2022-08-22 ENCOUNTER — Encounter: Payer: Self-pay | Admitting: Rehabilitative and Restorative Service Providers"

## 2022-08-22 DIAGNOSIS — M6281 Muscle weakness (generalized): Secondary | ICD-10-CM | POA: Diagnosis not present

## 2022-08-22 DIAGNOSIS — G8929 Other chronic pain: Secondary | ICD-10-CM | POA: Diagnosis not present

## 2022-08-22 DIAGNOSIS — R2689 Other abnormalities of gait and mobility: Secondary | ICD-10-CM

## 2022-08-22 DIAGNOSIS — M5459 Other low back pain: Secondary | ICD-10-CM

## 2022-08-22 DIAGNOSIS — M25512 Pain in left shoulder: Secondary | ICD-10-CM | POA: Diagnosis not present

## 2022-08-22 NOTE — Therapy (Signed)
OUTPATIENT PHYSICAL THERAPY TREATMENT [place]   Patient Name: Hector [name] MRN: [id number] DOB:1951/02/19, 71 y.o., male Today's Date: 08/22/2022  PHYSICAL THERAPY DISCHARGE SUMMARY  Visits from Start of Care: 4  Current functional level related to goals / functional outcomes: See note   Remaining deficits: See note   Education / Equipment: HEP  Patient goals were met. Patient is being discharged due to being pleased with the current functional level.    END OF SESSION:   PT End of Session - 08/22/22 1108     Visit Number 4    Number of Visits 20    Date for PT Re-Evaluation 10/02/22    Progress Note Due on Visit 10    PT Start Time 1100    PT Stop Time 1125    PT Time Calculation (min) 25 min    Activity Tolerance Patient tolerated treatment well    Behavior During Therapy [facility] for tasks assessed/performed                Past Medical History:  Diagnosis Date   Adhesive capsulitis 07/31/2018   Allergy    Arthritis    Bladder cancer ([place])    Chronic kidney disease    kidney stones   GERD (gastroesophageal reflux disease)    History of kidney stones    Hyperlipidemia    Hyperparathyroidism ([place])    Hypertension    Past Surgical History:  Procedure Laterality Date   COLONOSCOPY     CYSTOSCOPY N/A 09/16/2020   Procedure: CYSTOSCOPY;  Surgeon: Franchot Gallo, MD;  Location: [facility];  Service: Urology;  Laterality: N/A;   PARATHYROIDECTOMY Right 09/20/2021   Procedure: RIGHT INFERIOR PARATHYROIDECTOMY;  Surgeon: Armandina Gemma, MD;  Location: [facility];  Service: General;  Laterality: Right;  75   POLYPECTOMY     PROSTATE SURGERY  12/2012   no cancer   ROTATOR CUFF REPAIR Right 2019   TRANSURETHRAL RESECTION OF BLADDER NECK N/A 09/16/2020   Procedure: TRANSURETHRAL INCISION OF BLADDER NECK CONTRACTURE;  Surgeon: Franchot Gallo, MD;  Location: [facility];  Service: Urology;  Laterality: N/A;  62 MINS    TRANSURETHRAL RESECTION OF BLADDER TUMOR WITH MITOMYCIN-C N/A 09/16/2020   Procedure: TRANSURETHRAL RESECTION OF BLADDER TUMOR WITH post op  GEMCITABINE;  Surgeon: Franchot Gallo, MD;  Location: [facility];  Service: Urology;  Laterality: N/A;   WISDOM TOOTH EXTRACTION     Patient Active Problem List   Diagnosis Date Noted   Left foot pain 06/30/2022   Numbness and tingling of both legs 07/25/2021   Chronic left shoulder pain 11/12/2020   Chronic 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 03/24/2016   Hyperparathyroidism ([place]) 04/13/2015   Vitamin D deficiency 04/09/2015   Osteoarthritis 11/06/2014   Essential hypertension 11/06/2014   Hyperlipidemia 11/06/2014   GERD (gastroesophageal reflux disease) 11/06/2014   Syncope 11/06/2014   Routine general medical examination at a health care facility 11/06/2014    PCP: Hoyt Koch MD  REFERRING PROVIDER: Gregor Hams, MD  REFERRING DIAG: [phone] (ICD-10-CM) - Chronic left shoulder pain  THERAPY DIAG:  Chronic left shoulder pain  Other low back pain  Muscle weakness (generalized)  Other abnormalities of gait and mobility  Rationale for Evaluation and Treatment Rehabilitation  ONSET DATE: April 2023   SUBJECTIVE:  SUBJECTIVE STATEMENT: Pt indicated good improvement overall , at 90 %.  Felt comfortable for HEP trial.   PERTINENT HISTORY: History of Adhesive capsulitis Lt shoulder, Rt shoulder rotator cuff with good results around 2019 (therapy was performed) CKD/stones, hyperlipidemia, HTN  PAIN:  NPRS scale: 2-3/10 at worst  Pain location: Lt shoulder  Pain description: constant ache Aggravating factors: lifting, reaching, grip, sharp pain at times.  Relieving factors: trying to avoid those  movements  NPRS scale: 0/10 Pain location: back /hip Pain description: constant ache/tightness, sharp pain at times Aggravating factors: prolonged standing/walking, sitting Relieving factors: lying down   PRECAUTIONS: None  WEIGHT BEARING RESTRICTIONS No  FALLS:  Has patient fallen in last 6 months? Yes. Number of falls 2 No fear of falling indicated   LIVING ENVIRONMENT: Lives with: lives with their family Lives in: House/apartment Stairs: 2 steps to enter c handrails   OCCUPATION: No work  PLOF:  independent, Rt handed , walking for exercise, light lifting weights.  Yard work  PATIENT GOALS :  No pain, reaching without shoulder trouble, be able to sit /stand for longer periods of time.   OBJECTIVE:   DIAGNOSTIC FINDINGS:  07/24/2022: review of 07/06/2022 MD note:  [facility] Ultrasound of: Left shoulder  Biceps tendon is intact and normal appearing Subscapularis tendon is intact and normal appearing Supraspinatus tendon is intact with mild subacromial bursitis present. Infraspinatus tendon is intact and normal appearing AC joint degenerative appearing Impression: Subacromial bursitis  C-spine: DDD worse at C4-5 and C6-7.  No acute fractures.   Left shoulder: AC DJD mild.  No acute fractures.  No severe glenohumeral DJD.  PATIENT SURVEYS:  08/22/2022 Update 83  07/24/2022 FOTO intake:  56   predicted:  66  COGNITION: 07/24/2022  Overall cognitive status: WFL     SENSATION: 07/24/2022 Plan to check in future (light touch for LE, possible point discrimination)   POSTURE: 07/24/2022 Unremarkable in standing.   LUMBAR: 08/04/2022:  Lumbar extension 100 % WFL c mild end range symptoms  07/24/2022: Lumbar flexion:  To ankles without pain complaints.  Lumbar extension:  ERP lumbar and into posterior Lt buttock/thigh, 50 %.     Repeated x 5 in standing, improved to 75% c simliar ERP Lumbar lateral flexion Lt to knee joint without pain Lumbar lateral  flexion Rt to knee joint without pain   UPPER EXTREMITY ROM:   Active ROM Right 07/24/2022 Left 07/24/2022 Left 08/08/2022  Shoulder flexion  WFL in supine.  AROM to approx. 90 % WFL c pain from mid range, seated against gravity   Shoulder extension     Shoulder abduction  WFL in supine   Shoulder adduction     Shoulder internal rotation     Shoulder external rotation  70 deg in 45 deg abduction 78 in 45 deg abduction  Elbow flexion     Elbow extension     Wrist flexion     Wrist extension     Wrist ulnar deviation     Wrist radial deviation     Wrist pronation     Wrist supination     (Blank rows = not tested)  MMT:  MMT Right 07/24/2022 Left 07/24/2022 Left 08/04/2022 Left 08/22/2022  Shoulder flexion 5/5 3+/5 c pain  5/5  Shoulder extension      Shoulder abduction 5/5 3+/5 c pain  5/5  Shoulder adduction      Shoulder internal rotation 5/5 4/5  5/5  Shoulder external rotation 5/5 4/5  c pain 4/5 5/5  Middle trapezius      Lower trapezius      Elbow flexion      Elbow extension            Hip flexion      Hip abduction      Hip extension      Knee flexion      Knee extension      Ankle DF      Ankle PF      Ankle inversion      Ankle eversion      (Blank rows = not tested)  SPECIAL TESTS: 08/04/2022:    (+) ULTT 2A Lt arm, positive improvement c cervical distraction, (-) spurling Lt and Rt for radicular symptoms.   07/24/2022  (+) Painful arc Lt, (-) drop arm, (-) crossed SLR bilateral  BALANCE TESTING: 08/22/2022:  Tandem able to hold 30 sec bilateral, SLS 15 seconds bilateral  07/24/2022   [facility] PT Assessment - 07/24/22 0001       Standardized Balance Assessment   Standardized Balance Assessment Berg Balance Test      Berg Balance Test   Sit to Stand Able to stand without using hands and stabilize independently    Standing Unsupported Able to stand safely 2 minutes    Sitting with Back Unsupported but Feet Supported on Floor or Stool Able to sit safely  and securely 2 minutes    Stand to Sit Sits safely with minimal use of hands    Transfers Able to transfer safely, minor use of hands    Standing Unsupported with Eyes Closed Able to stand 10 seconds with supervision    Standing Unsupported with Feet Together Able to place feet together independently and stand 1 minute safely    From Standing, Reach Forward with Outstretched Arm Can reach confidently >25 cm (10")    From Standing Position, Pick up Object from Floor Able to pick up shoe safely and easily    From Standing Position, Turn to Look Behind Over each Shoulder Looks behind from both sides and weight shifts well    Turn 360 Degrees Able to turn 360 degrees safely in 4 seconds or less    Standing Unsupported, Alternately Place Feet on Step/Stool Able to stand independently and safely and complete 8 steps in 20 seconds    Standing Unsupported, One Foot in Front Able to take small step independently and hold 30 seconds    Standing on One Leg Able to lift leg independently and hold 5-10 seconds    Total Score 52             JOINT MOBILITY TESTING:  07/24/2022 None performed today  PALPATION:  08/08/2022:  palpation to trigger points in Lt infraspinatus revealed concordant UE symptoms.   07/24/2022 Tenderness in Lt upper trap, infraspinatus, anterior jt line/superior Joint Lt shoulder.    TODAY'S TREATMENT:  08/22/2022: Therex: Nustep lvl 6 10 mins UE/LE Machine rows x 15 35 lbs Machine lat pull down x 15 35 lbs Machine chest press x 15 35 lbs  Review of existing HEP c advice/cues on routine, frequency and goals for long term use.   08/08/2022: Therex: Standing blue band rows c scapular retraction x 20 Standing blue band Gh ext x 20 Standing blue band ER slow control focus 2 x 10 c towel at side bilateral   Manual: Upper limb nerve flossing ulnar in ULTT 2a Lt arm positioning Compression c movement to Lt infraspinatus  for trigger point release  Mechanical Traction 18/13  lbs intermittent 60/20 sec holds 15 mins   08/04/2022: Therex: Nustep Lvl 6 6 mins UE/LE Upper limb nerve flossing for median nerve (server to arm extension looking at palm Lt arm) x 10 (cues for home use)  Manual: Upper limb nerve flossing ulnar in ULTT 2a Lt arm positioning Cervical distraction manually    Neuro Re-ed Tandem ambulation on foam in bars fwd 6 ft x 10  Standing   Mechanical Traction 18/15 lbs intermittent 60/20 sec holds 10 mins   PATIENT EDUCATION: 07/24/2022 Education details: HEP, POC Person educated: Patient Education method: Consulting civil engineer, Demonstration, Verbal cues, and Handouts Education comprehension: verbalized understanding, returned demonstration, and verbal cues required   HOME EXERCISE PROGRAM: Access Code: [email or web address] URL: [email or web address] Date: 07/24/2022 Prepared by: Scot Jun  Exercises - Supine Lower Trunk Rotation  - 2-3 x daily - 7 x weekly - 1 sets - 3-5 reps - 15 hold - Standing Lumbar Extension  - 2-3 x daily - 7 x weekly - 1 sets - 5-10 reps - Standing Shoulder Row with Anchored Resistance  - 1-2 x daily - 7 x weekly - 1-2 sets - 10-15 reps - Shoulder Extension with Resistance  - 1-2 x daily - 7 x weekly - 1-2 sets - 10-15 reps - Shoulder External Rotation and Scapular Retraction with Resistance  - 1-2 x daily - 7 x weekly - 1-2 sets - 10 reps - Tandem Stance  - 1-2 x daily - 7 x weekly - 3 sets - 10 reps  ASSESSMENT:  CLINICAL IMPRESSION: Pt has attended 4 visits overall during course of treatment, reporting 90% overall improvement.  See objective data for updated information.  Good improvement in most areas noted.  Recommend discharge to HEP at this time c return in future as necessary with new evaluation.    OBJECTIVE IMPAIRMENTS decreased activity tolerance, decreased balance, decreased mobility, difficulty walking, decreased ROM, decreased strength, hypomobility, increased fascial restrictions, impaired  perceived functional ability, impaired flexibility, impaired UE functional use, improper body mechanics, and pain.   ACTIVITY LIMITATIONS carrying, lifting, bending, sitting, standing, squatting, sleeping, transfers, and locomotion level, reaching overhead  PARTICIPATION LIMITATIONS: interpersonal relationship, community activity, yard work, and exercise  PERSONAL FACTORS Time since onset of injury/illness/exacerbation CKD/stones, hyperlipidemia, HTN, multiple treatment areas are also affecting patient's functional outcome.   REHAB POTENTIAL: Good  CLINICAL DECISION MAKING: Stable/uncomplicated  EVALUATION COMPLEXITY: Low   GOALS: Goals reviewed with patient? Yes  Short term PT Goals (target date for Short term goals are 3 weeks 08/14/2022) Patient will demonstrate independent use of home exercise program to maintain progress from in clinic treatments. Goal status: on going - assessed 08/04/2022   Long term PT goals (target dates for all long term goals are 10 weeks  10/02/2022 )   1. Patient will demonstrate/report pain at worst less than or equal to 2/10 to facilitate minimal limitation in daily activity secondary to pain symptoms. Goal status: Mostly met - 08/22/2022   2. Patient will demonstrate independent use of home exercise program to facilitate ability to maintain/progress functional gains from skilled physical therapy services. Goal status: Met - 08/22/2022   3. Patient will demonstrate FOTO outcome > or = 66 % to indicate reduced disability due to condition. Goal status: Met - 08/22/2022   4.  Patient will demonstrate Lt UE MMT 5/5 throughout to facilitate lifting, reaching, carrying at [facility] in daily activity.   Goal status: Met - 08/22/2022  5.  Patient will demonstrate Lt [place] joint AROM WFL against gravity s symptoms to facilitate usual overhead reaching, self care, dressing at PLOF.     Goal status: Met - 08/22/2022   6.  Patient will demonstrate tandem stance > 30 sec  bilateral, SLS > 15 second bilateral for improved stability in ambulation.   Goal status: Met - 08/22/2022    PLAN: PT FREQUENCY: 1-2x/week  PT DURATION: 10 weeks  PLANNED INTERVENTIONS: Therapeutic exercises, Therapeutic activity, Neuro Muscular re-education, Balance training, Gait training, Patient/Family education, Joint mobilization, Stair training, DME instructions, Dry Needling, Electrical stimulation, Cryotherapy, Moist heat, Taping, Traction, Ultrasound, Ionotophoresis 16m/ml Dexamethasone, and Manual therapy.  All included unless contraindicated   PLAN FOR NEXT SESSION: Discharge.  Return for eval in future if necessary.    MScot Jun PT, DPT, OCS, ATC 08/22/22  11:25 AM

## 2022-09-11 ENCOUNTER — Encounter: Payer: Self-pay | Admitting: Internal Medicine

## 2022-09-13 ENCOUNTER — Encounter: Payer: Self-pay | Admitting: Internal Medicine

## 2022-09-15 MED ORDER — BENZONATATE 200 MG PO CAPS: 200.0000 mg | ORAL_CAPSULE | Freq: Three times a day (TID) | ORAL | 0 refills | Status: DC | PRN

## 2022-09-21 ENCOUNTER — Ambulatory Visit: Payer: Medicare Other | Admitting: Internal Medicine

## 2022-11-04 DIAGNOSIS — R197 Diarrhea, unspecified: Secondary | ICD-10-CM | POA: Diagnosis not present

## 2022-11-20 ENCOUNTER — Encounter: Payer: Self-pay | Admitting: Internal Medicine

## 2022-11-20 ENCOUNTER — Telehealth (INDEPENDENT_AMBULATORY_CARE_PROVIDER_SITE_OTHER): Payer: Medicare Other | Admitting: Internal Medicine

## 2022-11-20 DIAGNOSIS — A084 Viral intestinal infection, unspecified: Secondary | ICD-10-CM

## 2022-11-20 NOTE — Progress Notes (Unsigned)
Virtual Visit via Video Note  I connected with Hector [name] on 11/20/22 at  3:40 PM EST by a video enabled telemedicine application and verified that I am speaking with the correct person using two identifiers.  The patient and the provider were at separate locations throughout the entire encounter. Patient location: home, Provider location: work   I discussed the limitations of evaluation and management by telemedicine and the availability of in person appointments. The patient expressed understanding and agreed to proceed. The patient and the provider were the only parties present for the visit unless noted in HPI below.  History of Present Illness: The patient is a 71 y.o. {} with visit for {}. Started {}. Has {}. Denies {}. Overall it is {}. Has tried {}  Observations/Objective: Appearance: {}, breathing appears {}, {} grooming, abdomen {} appear distended, throat {}, memory {}, mental status is {}  Assessment and Plan: See problem oriented charting  Follow Up Instructions: {}  I discussed the assessment and treatment plan with the patient. The patient was provided an opportunity to ask questions and all were answered. The patient agreed with the plan and demonstrated an understanding of the instructions.   The patient was advised to call back or seek an in-person evaluation if the symptoms worsen or if the condition fails to improve as anticipated.  Hoyt Koch, MD

## 2022-11-21 DIAGNOSIS — A084 Viral intestinal infection, unspecified: Secondary | ICD-10-CM | POA: Insufficient documentation

## 2022-11-21 NOTE — Assessment & Plan Note (Signed)
No diarrhea in 24 hours and last imodium Saturday. Encouraged to continue brat diet. Add probiotic. If recurrence we will order GI profile PCR stool to check for pathogens. He did have symptoms about 1 week ago, improvement for a few days then worsening again which is less typical but still compatible with viral etiology. No recent antibiotics or hospital stay making low risk for C dif.

## 2022-11-23 ENCOUNTER — Encounter: Payer: Self-pay | Admitting: Internal Medicine

## 2022-11-24 ENCOUNTER — Encounter: Payer: Self-pay | Admitting: Internal Medicine

## 2022-12-04 ENCOUNTER — Telehealth: Payer: Medicare Other | Admitting: Physician Assistant

## 2022-12-04 DIAGNOSIS — R053 Chronic cough: Secondary | ICD-10-CM

## 2022-12-04 NOTE — Progress Notes (Signed)
Because of ongoing and persistent cough over the past several months, I feel your condition warrants further evaluation and I recommend that you be seen in a face to face visit. This is above the scope of the evaluation that can be done via an e-visit. Please call your PCP first thing tomorrow morning for further evaluation and management.    NOTE: There will be NO CHARGE for this eVisit   If you are having a true medical emergency please call 911.      For an urgent face to face visit, [place] has seven urgent care centers for your convenience:     [facility] [place] [facility] [place] Get Driving Directions [phone] [address] [place], [place] [address]    [place] [facility]) Get Driving Directions [phone] [place], [place] [address]  [place] [facility] [place] [facility]) Get Driving Directions [phone] [address] [place],  [place]  [address]  [place] [facility] [place] [facility] at [address] Get Driving Directions  [phone] [address] [place],  [place] [address]   [facility] Get Driving Directions [phone] [address] [place], [place], [place] [address]   [place] Get Driving Directions  [phone] [address] [place], [place] [address]   [place] [facility] [place] Get Driving Directions [phone] [address] [place], [place] [address]  Your MyChart E-visit questionnaire answers were reviewed by a board certified advanced clinical practitioner to complete your personal care plan based on your specific symptoms.  Thank you for using e-Visits.

## 2022-12-05 ENCOUNTER — Ambulatory Visit
Admission: RE | Admit: 2022-12-05 | Discharge: 2022-12-05 | Disposition: A | Discharge status: Home / Self Care | Payer: Medicare Other | Source: Ambulatory Visit | Attending: Family Medicine | Admitting: Family Medicine

## 2022-12-05 VITALS — BP 159/92 | HR 60 | Temp 98.3°F | Resp 20

## 2022-12-05 DIAGNOSIS — R052 Subacute cough: Secondary | ICD-10-CM | POA: Diagnosis not present

## 2022-12-05 DIAGNOSIS — J9801 Acute bronchospasm: Secondary | ICD-10-CM | POA: Diagnosis not present

## 2022-12-05 MED ORDER — ALBUTEROL SULFATE HFA 108 (90 BASE) MCG/ACT IN AERS: 2.0000 | INHALATION_SPRAY | RESPIRATORY_TRACT | 0 refills | Status: DC | PRN

## 2022-12-05 MED ORDER — BENZONATATE 100 MG PO CAPS: 100.0000 mg | ORAL_CAPSULE | Freq: Three times a day (TID) | ORAL | 0 refills | Status: DC | PRN

## 2022-12-05 MED ORDER — PREDNISONE 20 MG PO TABS: 40.0000 mg | ORAL_TABLET | Freq: Every day | ORAL | 0 refills | Status: AC

## 2022-12-05 NOTE — Telephone Encounter (Signed)
Patient was seen at an urgent care for cough.

## 2022-12-05 NOTE — ED Provider Notes (Signed)
Inhaler.  Nor has he ever used an inhaler [facility]    CSN: [id number] Arrival date & time: 12/05/22  0801      History   Chief Complaint Chief Complaint  Patient presents with   Cough    This symptom goes back to a positive Covid test on October 7th.  Symptoms included respiratory difficulty, vomiting, diarrhea, fever, then chronic coughing to now spasmic cough with night times being more frequent.  Treatments hav included OTC meds. - Entered by patient    HPI Hector [name] is a 72 y.o. male.    Cough  Here for cough that's been going on about 3-4 weeks.   In late October he was positive for COVID and that cough lasted 2 or 3 weeks.  He then had a GI bug right before Thanksgiving, and that resolved.  Then early December he had a new upper respiratory infection with some nasal congestion.  The nasal congestion and rhinorrhea have improved, but he is left with a dry cough.  He is not producing mucus.  He has not really felt short of breath.  No recent fever  He has never been known to have asthma nor has he ever used an inhaler  PMH: htn, no DM  Past Medical History:  Diagnosis Date   Adhesive capsulitis 07/31/2018   Allergy    Arthritis    Bladder cancer ([place])    Chronic kidney disease    kidney stones   GERD (gastroesophageal reflux disease)    History of kidney stones    Hyperlipidemia    Hyperparathyroidism ([place])    Hypertension     Patient Active Problem List   Diagnosis Date Noted   Viral gastroenteritis 11/21/2022   Left foot pain 06/30/2022   Numbness and tingling of both legs 07/25/2021   Chronic left shoulder pain 11/12/2020   Chronic 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 03/24/2016   Hyperparathyroidism ([place]) 04/13/2015   Vitamin D deficiency 04/09/2015   Osteoarthritis 11/06/2014   Essential hypertension 11/06/2014   Hyperlipidemia 11/06/2014   GERD (gastroesophageal reflux disease) 11/06/2014   Syncope 11/06/2014    Routine general medical examination at a health care facility 11/06/2014    Past Surgical History:  Procedure Laterality Date   COLONOSCOPY     CYSTOSCOPY N/A 09/16/2020   Procedure: CYSTOSCOPY;  Surgeon: Franchot Gallo, MD;  Location: [facility];  Service: Urology;  Laterality: N/A;   PARATHYROIDECTOMY Right 09/20/2021   Procedure: RIGHT INFERIOR PARATHYROIDECTOMY;  Surgeon: Armandina Gemma, MD;  Location: [facility];  Service: General;  Laterality: Right;  77   POLYPECTOMY     PROSTATE SURGERY  12/2012   no cancer   ROTATOR CUFF REPAIR Right 2019   TRANSURETHRAL RESECTION OF BLADDER NECK N/A 09/16/2020   Procedure: TRANSURETHRAL INCISION OF BLADDER NECK CONTRACTURE;  Surgeon: Franchot Gallo, MD;  Location: [facility];  Service: Urology;  Laterality: N/A;  74 MINS   TRANSURETHRAL RESECTION OF BLADDER TUMOR WITH MITOMYCIN-C N/A 09/16/2020   Procedure: TRANSURETHRAL RESECTION OF BLADDER TUMOR WITH post op  GEMCITABINE;  Surgeon: Franchot Gallo, MD;  Location: [facility];  Service: Urology;  Laterality: N/A;   WISDOM TOOTH EXTRACTION         Home Medications    Prior to Admission medications   Medication Sig Start Date End Date Taking? Authorizing Provider  albuterol (VENTOLIN HFA) 108 (90 Base) MCG/ACT inhaler Inhale 2 puffs into the lungs every 4 (four)  hours as needed for wheezing or shortness of breath. 12/05/22  Yes Keandria Berrocal, Gwenlyn Perking, MD  benzonatate (TESSALON) 100 MG capsule Take 1 capsule (100 mg total) by mouth 3 (three) times daily as needed for cough. 12/05/22  Yes Barrett Henle, MD  predniSONE (DELTASONE) 20 MG tablet Take 2 tablets (40 mg total) by mouth daily with breakfast for 5 days. 12/05/22 12/10/22 Yes Brison Fiumara, Gwenlyn Perking, MD  amLODipine (NORVASC) 5 MG tablet Take 5 mg by mouth daily.    [provider]  aspirin EC 81 MG tablet Take 81 mg by mouth daily. Swallow whole.    [provider]  cetirizine  (ZYRTEC) 10 MG tablet Take 10 mg by mouth daily.    [provider]  cholecalciferol (VITAMIN D3) 25 MCG (1000 UNIT) tablet Take 1,000 Units by mouth daily.    [provider]  fluticasone Asencion Islam) 50 MCG/ACT nasal spray USE 2 SPRAYS IN [facility] NOSTRIL DAILY 05/16/22   Hoyt Koch, MD  furosemide (LASIX) 40 MG tablet Take 1 tablet (40 mg total) by mouth daily. Patient taking differently: Take 40 mg by mouth at bedtime. 08/11/21   Renato Shin, MD  gabapentin (NEURONTIN) 300 MG capsule Take 1 capsule (300 mg total) by mouth 3 (three) times daily. 07/06/22   Gregor Hams, MD  loratadine (CLARITIN) 10 MG tablet Take 10 mg by mouth daily.    [provider]  losartan (COZAAR) 100 MG tablet TAKE 1 TABLET DAILY 02/27/22   Hoyt Koch, MD  lovastatin (MEVACOR) 20 MG tablet Take 20 mg by mouth at bedtime.    [provider]  meloxicam (MOBIC) 15 MG tablet Take 15 mg by mouth daily.     [provider]  pantoprazole (PROTONIX) 40 MG tablet TAKE 1 TABLET DAILY 08/04/22   Hoyt Koch, MD  tamsulosin (FLOMAX) 0.4 MG CAPS capsule Take 1 capsule (0.4 mg total) by mouth daily. 11/15/20   Veryl Speak, MD  terazosin (HYTRIN) 10 MG capsule Take 10 mg by mouth at bedtime.     [provider]  triamcinolone cream (KENALOG) 0.1 % Apply topically twice a day. 08/22/22   Hoyt Koch, MD    Family History Family History  Problem Relation Age of Onset   Arthritis Mother    Hyperlipidemia Mother    Heart disease Mother    Stomach cancer Mother    Colon polyps Mother    Arthritis Father    Hyperlipidemia Father    Heart disease Father    Kidney disease Father    Breast cancer Sister    Hypertension Sister    Ovarian cancer Sister    Gallbladder disease Sister        x 6   Colon cancer Neg Hx    Liver cancer Neg Hx    Rectal cancer Neg Hx    Esophageal cancer Neg Hx     Social History Social History   Tobacco Use    Smoking status: Never   Smokeless tobacco: Never  Vaping Use   Vaping Use: Never used  Substance Use Topics   Alcohol use: No    Alcohol/week: 0.0 standard drinks of alcohol   Drug use: No     Allergies   Lisinopril and Shellfish allergy   Review of Systems Review of Systems  Respiratory:  Positive for cough.      Physical Exam Triage Vital Signs ED Triage Vitals [12/05/22 0818]  Enc Vitals Group  BP (!) 159/92     Pulse Rate 60     Resp 20     Temp 98.3 F (36.8 C)     Temp Source Oral     SpO2 96 %     Weight      Height      Head Circumference      Peak Flow      Pain Score 0     Pain Loc      Pain Edu?      Excl. in [place]?    No data found.  Updated Vital Signs BP (!) 159/92 (BP Location: Right Arm)   Pulse 60   Temp 98.3 F (36.8 C) (Oral)   Resp 20   SpO2 96%   Visual Acuity Right Eye Distance:   Left Eye Distance:   Bilateral Distance:    Right Eye Near:   Left Eye Near:    Bilateral Near:     Physical Exam Vitals reviewed.  Constitutional:      General: He is not in acute distress.    Appearance: He is not toxic-appearing.  HENT:     Right Ear: Tympanic membrane and ear canal normal.     Left Ear: Tympanic membrane and ear canal normal.     Nose: Nose normal.     Mouth/Throat:     Mouth: Mucous membranes are moist.     Pharynx: No oropharyngeal exudate or posterior oropharyngeal erythema.  Eyes:     Extraocular Movements: Extraocular movements intact.     Conjunctiva/sclera: Conjunctivae normal.     Pupils: Pupils are equal, round, and reactive to light.  Cardiovascular:     Rate and Rhythm: Normal rate and regular rhythm.     Heart sounds: No murmur heard. Pulmonary:     Effort: No respiratory distress.     Breath sounds: No stridor. No rhonchi or rales.     Comments: There is no wheeze on exam except when he coughs there is a slight wheeze right after.  Otherwise lungs are clear without rales or rhonchi Chest:     Chest  wall: No tenderness.  Musculoskeletal:     Cervical back: Neck supple.  Lymphadenopathy:     Cervical: No cervical adenopathy.  Skin:    Capillary Refill: Capillary refill takes less than 2 seconds.     Coloration: Skin is not jaundiced or pale.  Neurological:     General: No focal deficit present.     Mental Status: He is alert and oriented to person, place, and time.  Psychiatric:        Behavior: Behavior normal.      UC Treatments / Results  Labs (all labs ordered are listed, but only abnormal results are displayed) Labs Reviewed - No data to display  EKG   Radiology No results found.  Procedures Procedures (including critical care time)  Medications Ordered in UC Medications - No data to display  Initial Impression / Assessment and Plan / UC Course  I have reviewed the triage vital signs and the nursing notes.  Pertinent labs & imaging results that were available during my care of the patient were reviewed by me and considered in my medical decision making (see chart for details).        I am going to treat for possible bronchospasm and asthma exacerbation.  He will follow-up with his primary care.  Imaging is not done today as his lungs are clear other than the wheeze with  cough. Final Clinical Impressions(s) / UC Diagnoses   Final diagnoses:  Subacute cough  Bronchospasm     Discharge Instructions      Albuterol inhaler--do 2 puffs every 4 hours as needed for shortness of breath or wheezing  Take prednisone 20 mg--2 daily for 5 days  Take benzonatate 100 mg, 1 tab every 8 hours as needed for cough.  Please follow-up with your primary care about this issue, especially if not improving completely.     ED Prescriptions     Medication Sig Dispense Auth. Provider   albuterol (VENTOLIN HFA) 108 (90 Base) MCG/ACT inhaler Inhale 2 puffs into the lungs every 4 (four) hours as needed for wheezing or shortness of breath. 1 each Barrett Henle, MD    benzonatate (TESSALON) 100 MG capsule Take 1 capsule (100 mg total) by mouth 3 (three) times daily as needed for cough. 21 capsule Barrett Henle, MD   predniSONE (DELTASONE) 20 MG tablet Take 2 tablets (40 mg total) by mouth daily with breakfast for 5 days. 10 tablet Windy Carina Gwenlyn Perking, MD      PDMP not reviewed this encounter.   Barrett Henle, MD 12/05/22 [phone]

## 2022-12-05 NOTE — Discharge Instructions (Signed)
Albuterol inhaler--do 2 puffs every 4 hours as needed for shortness of breath or wheezing  Take prednisone 20 mg--2 daily for 5 days  Take benzonatate 100 mg, 1 tab every 8 hours as needed for cough.  Please follow-up with your primary care about this issue, especially if not improving completely.

## 2022-12-05 NOTE — ED Triage Notes (Signed)
Pt c/o cough x 2-3 weeks-states he has note felt well since having covid in Oct-NAD-steady gait

## 2022-12-07 ENCOUNTER — Ambulatory Visit (INDEPENDENT_AMBULATORY_CARE_PROVIDER_SITE_OTHER): Payer: Medicare Other

## 2022-12-07 VITALS — Ht 68.0 in | Wt 212.0 lb

## 2022-12-07 DIAGNOSIS — Z0000 Encounter for general adult medical examination without abnormal findings: Secondary | ICD-10-CM | POA: Diagnosis not present

## 2022-12-07 NOTE — Patient Instructions (Addendum)
Hector [name] , Thank you for taking time to come for your Medicare Wellness Visit. I appreciate your ongoing commitment to your health goals. Please review the following plan we discussed and let me know if I can assist you in the future.   These are the goals we discussed:  Goals       Patient Stated (pt-stated)      To maintain my current health status by continuing to eat healthy, stay physically active and socially active.        This is a list of the screening recommended for you and due dates:  Health Maintenance  Topic Date Due   COVID-19 Vaccine (7 - 2023-24 season) 08/04/2022   Medicare Annual Wellness Visit  12/08/2023   Colon Cancer Screening  03/07/2028   DTaP/Tdap/Td vaccine (6 - Td or Tdap) 06/19/2029   Pneumonia Vaccine  Completed   Flu Shot  Completed   Hepatitis C Screening: USPSTF Recommendation to screen - Ages 18-79 yo.  Completed   Zoster (Shingles) Vaccine  Completed   HPV Vaccine  Aged Out    Advanced directives: Yes; documents on file.  Conditions/risks identified: Yes  Next appointment: Follow up in one year for your annual wellness visit.   Preventive Care 68 Years and Older, Male  Preventive care refers to lifestyle choices and visits with your health care provider that can promote health and wellness. What does preventive care include? A yearly physical exam. This is also called an annual well check. Dental exams once or twice a year. Routine eye exams. Ask your health care provider how often you should have your eyes checked. Personal lifestyle choices, including: Daily care of your teeth and gums. Regular physical activity. Eating a healthy diet. Avoiding tobacco and drug use. Limiting alcohol use. Practicing safe sex. Taking low doses of aspirin every day. Taking vitamin and mineral supplements as recommended by your health care provider. What happens during an annual well check? The services and screenings done by your health care  provider during your annual well check will depend on your age, overall health, lifestyle risk factors, and family history of disease. Counseling  Your health care provider may ask you questions about your: Alcohol use. Tobacco use. Drug use. Emotional well-being. Home and relationship well-being. Sexual activity. Eating habits. History of falls. Memory and ability to understand (cognition). Work and work Statistician. Screening  You may have the following tests or measurements: Height, weight, and BMI. Blood pressure. Lipid and cholesterol levels. These may be checked every 5 years, or more frequently if you are over 65 years old. Skin check. Lung cancer screening. You may have this screening every year starting at age 69 if you have a 30-pack-year history of smoking and currently smoke or have quit within the past 15 years. Fecal occult blood test (FOBT) of the stool. You may have this test every year starting at age 40. Flexible sigmoidoscopy or colonoscopy. You may have a sigmoidoscopy every 5 years or a colonoscopy every 10 years starting at age 103. Prostate cancer screening. Recommendations will vary depending on your family history and other risks. Hepatitis C blood test. Hepatitis B blood test. Sexually transmitted disease (STD) testing. Diabetes screening. This is done by checking your blood sugar (glucose) after you have not eaten for a while (fasting). You may have this done every 1-3 years. Abdominal aortic aneurysm (AAA) screening. You may need this if you are a current or former smoker. Osteoporosis. You may be screened starting at  age 59 if you are at high risk. Talk with your health care provider about your test results, treatment options, and if necessary, the need for more tests. Vaccines  Your health care provider may recommend certain vaccines, such as: Influenza vaccine. This is recommended every year. Tetanus, diphtheria, and acellular pertussis (Tdap, Td)  vaccine. You may need a Td booster every 10 years. Zoster vaccine. You may need this after age 30. Pneumococcal 13-valent conjugate (PCV13) vaccine. One dose is recommended after age 13. Pneumococcal polysaccharide (PPSV23) vaccine. One dose is recommended after age 65. Talk to your health care provider about which screenings and vaccines you need and how often you need them. This information is not intended to replace advice given to you by your health care provider. Make sure you discuss any questions you have with your health care provider. Document Released: 12/17/2015 Document Revised: 08/09/2016 Document Reviewed: 09/21/2015 Elsevier Interactive Patient Education  2017 [employer] Prevention in the Home Falls can cause injuries. They can happen to people of all ages. There are many things you can do to make your home safe and to help prevent falls. What can I do on the outside of my home? Regularly fix the edges of walkways and driveways and fix any cracks. Remove anything that might make you trip as you walk through a door, such as a raised step or threshold. Trim any bushes or trees on the path to your home. Use bright outdoor lighting. Clear any walking paths of anything that might make someone trip, such as rocks or tools. Regularly check to see if handrails are loose or broken. Make sure that both sides of any steps have handrails. Any raised decks and porches should have guardrails on the edges. Have any leaves, snow, or ice cleared regularly. Use sand or salt on walking paths during winter. Clean up any spills in your garage right away. This includes oil or grease spills. What can I do in the bathroom? Use night lights. Install grab bars by the toilet and in the tub and shower. Do not use towel bars as grab bars. Use non-skid mats or decals in the tub or shower. If you need to sit down in the shower, use a plastic, non-slip stool. Keep the floor dry. Clean up any  water that spills on the floor as soon as it happens. Remove soap buildup in the tub or shower regularly. Attach bath mats securely with double-sided non-slip rug tape. Do not have throw rugs and other things on the floor that can make you trip. What can I do in the bedroom? Use night lights. Make sure that you have a light by your bed that is easy to reach. Do not use any sheets or blankets that are too big for your bed. They should not hang down onto the floor. Have a firm chair that has side arms. You can use this for support while you get dressed. Do not have throw rugs and other things on the floor that can make you trip. What can I do in the kitchen? Clean up any spills right away. Avoid walking on wet floors. Keep items that you use a lot in easy-to-reach places. If you need to reach something above you, use a strong step stool that has a grab bar. Keep electrical cords out of the way. Do not use floor polish or wax that makes floors slippery. If you must use wax, use non-skid floor wax. Do not have throw rugs and  other things on the floor that can make you trip. What can I do with my stairs? Do not leave any items on the stairs. Make sure that there are handrails on both sides of the stairs and use them. Fix handrails that are broken or loose. Make sure that handrails are as long as the stairways. Check any carpeting to make sure that it is firmly attached to the stairs. Fix any carpet that is loose or worn. Avoid having throw rugs at the top or bottom of the stairs. If you do have throw rugs, attach them to the floor with carpet tape. Make sure that you have a light switch at the top of the stairs and the bottom of the stairs. If you do not have them, ask someone to add them for you. What else can I do to help prevent falls? Wear shoes that: Do not have high heels. Have rubber bottoms. Are comfortable and fit you well. Are closed at the toe. Do not wear sandals. If you use a  stepladder: Make sure that it is fully opened. Do not climb a closed stepladder. Make sure that both sides of the stepladder are locked into place. Ask someone to hold it for you, if possible. Clearly mark and make sure that you can see: Any grab bars or handrails. First and last steps. Where the edge of each step is. Use tools that help you move around (mobility aids) if they are needed. These include: Canes. Walkers. Scooters. Crutches. Turn on the lights when you go into a dark area. Replace any light bulbs as soon as they burn out. Set up your furniture so you have a clear path. Avoid moving your furniture around. If any of your floors are uneven, fix them. If there are any pets around you, be aware of where they are. Review your medicines with your doctor. Some medicines can make you feel dizzy. This can increase your chance of falling. Ask your doctor what other things that you can do to help prevent falls. This information is not intended to replace advice given to you by your health care provider. Make sure you discuss any questions you have with your health care provider. Document Released: 09/16/2009 Document Revised: 04/27/2016 Document Reviewed: 12/25/2014 Elsevier Interactive Patient Education  2017 [employer].

## 2022-12-07 NOTE — Progress Notes (Signed)
Virtual Visit via Telephone Note  I connected with  Hector [name] on 12/07/22 at  8:45 AM EST by telephone and verified that I am speaking with the correct person using two identifiers.  Location: Patient: Home Provider: [place] Persons participating in the virtual visit: [place]   I discussed the limitations, risks, security and privacy concerns of performing an evaluation and management service by telephone and the availability of in person appointments. The patient expressed understanding and agreed to proceed.  Interactive audio and video telecommunications were attempted between this nurse and patient, however failed, due to patient having technical difficulties OR patient did not have access to video capability.  We continued and completed visit with audio only.  Some vital signs may be absent or patient reported.   Sheral Flow, LPN  Subjective:   VESTER [name] is a 72 y.o. male who presents for Medicare Annual/Subsequent preventive examination.  Review of Systems     Cardiac Risk Factors include: advanced age (>57mn, >>69women);dyslipidemia;family history of premature cardiovascular disease;hypertension;male gender;obesity (BMI >30kg/m2)     Objective:    Today's Vitals   12/07/22 0846  Weight: 212 lb (96.2 kg)  Height: '5\' 8"'$  (1.727 m)  PainSc: 0-No pain   Body mass index is 32.23 kg/m.     12/07/2022    9:02 AM 12/07/2022    8:48 AM 07/24/2022    1:05 PM 12/06/2021    8:46 AM 09/20/2021    8:39 AM 09/16/2021    8:03 AM 12/03/2020    9:03 AM  Advanced Directives  Does Patient Have a Medical Advance Directive? Yes Yes Yes Yes Yes Yes No  Type of AParamed[place]of [place] will [place] will Living will;Healthcare Power of Attorney Living will;Healthcare Power of [place] will   Does patient want  to make changes to medical advance directive? No - Patient declined  No - Patient declined No - Patient declined     Copy of [place] Chart? Yes - validated most recent copy scanned in chart (See row information) No - copy requested  No - copy requested No - copy requested No - copy requested   Would patient like information on creating a medical advance directive?       No - Patient declined    Current Medications (verified) Outpatient Encounter Medications as of 12/07/2022  Medication Sig   albuterol (VENTOLIN HFA) 108 (90 Base) MCG/ACT inhaler Inhale 2 puffs into the lungs every 4 (four) hours as needed for wheezing or shortness of breath.   amLODipine (NORVASC) 5 MG tablet Take 5 mg by mouth daily.   aspirin EC 81 MG tablet Take 81 mg by mouth daily. Swallow whole.   benzonatate (TESSALON) 100 MG capsule Take 1 capsule (100 mg total) by mouth 3 (three) times daily as needed for cough.   cetirizine (ZYRTEC) 10 MG tablet Take 10 mg by mouth daily.   cholecalciferol (VITAMIN D3) 25 MCG (1000 UNIT) tablet Take 1,000 Units by mouth daily.   fluticasone (FLONASE) 50 MCG/ACT nasal spray USE 2 SPRAYS IN EACH NOSTRIL DAILY   gabapentin (NEURONTIN) 300 MG capsule Take 1 capsule (300 mg total) by mouth 3 (three) times daily.   loratadine (CLARITIN) 10 MG tablet Take 10 mg by mouth daily.   losartan (COZAAR) 100 MG tablet TAKE 1 TABLET DAILY   lovastatin (MEVACOR) 20 MG tablet Take  20 mg by mouth at bedtime.   meloxicam (MOBIC) 15 MG tablet Take 15 mg by mouth daily.    pantoprazole (PROTONIX) 40 MG tablet TAKE 1 TABLET DAILY   predniSONE (DELTASONE) 20 MG tablet Take 2 tablets (40 mg total) by mouth daily with breakfast for 5 days.   tamsulosin (FLOMAX) 0.4 MG CAPS capsule Take 1 capsule (0.4 mg total) by mouth daily.   terazosin (HYTRIN) 10 MG capsule Take 10 mg by mouth at bedtime.    triamcinolone cream (KENALOG) 0.1 % Apply topically twice a day.   [DISCONTINUED] furosemide  (LASIX) 40 MG tablet Take 1 tablet (40 mg total) by mouth daily. (Patient taking differently: Take 40 mg by mouth at bedtime.)   No facility-administered encounter medications on file as of 12/07/2022.    Allergies (verified) Lisinopril and Shellfish allergy   History: Past Medical History:  Diagnosis Date   Adhesive capsulitis 07/31/2018   Allergy    Arthritis    Bladder cancer ([place])    Chronic kidney disease    kidney stones   GERD (gastroesophageal reflux disease)    History of kidney stones    Hyperlipidemia    Hyperparathyroidism ([place])    Hypertension    Past Surgical History:  Procedure Laterality Date   COLONOSCOPY     CYSTOSCOPY N/A 09/16/2020   Procedure: CYSTOSCOPY;  Surgeon: Franchot Gallo, MD;  Location: [facility];  Service: Urology;  Laterality: N/A;   PARATHYROIDECTOMY Right 09/20/2021   Procedure: RIGHT INFERIOR PARATHYROIDECTOMY;  Surgeon: Armandina Gemma, MD;  Location: [facility];  Service: General;  Laterality: Right;  77   POLYPECTOMY     PROSTATE SURGERY  12/2012   no cancer   ROTATOR CUFF REPAIR Right 2019   TRANSURETHRAL RESECTION OF BLADDER NECK N/A 09/16/2020   Procedure: TRANSURETHRAL INCISION OF BLADDER NECK CONTRACTURE;  Surgeon: Franchot Gallo, MD;  Location: [facility];  Service: Urology;  Laterality: N/A;  75 MINS   TRANSURETHRAL RESECTION OF BLADDER TUMOR WITH MITOMYCIN-C N/A 09/16/2020   Procedure: TRANSURETHRAL RESECTION OF BLADDER TUMOR WITH post op  GEMCITABINE;  Surgeon: Franchot Gallo, MD;  Location: [facility];  Service: Urology;  Laterality: N/A;   WISDOM TOOTH EXTRACTION     Family History  Problem Relation Age of Onset   Arthritis Mother    Hyperlipidemia Mother    Heart disease Mother    Stomach cancer Mother    Colon polyps Mother    Arthritis Father    Hyperlipidemia Father    Heart disease Father    Kidney disease Father    Breast cancer Sister    Hypertension Sister     Ovarian cancer Sister    Gallbladder disease Sister        x 6   Colon cancer Neg Hx    Liver cancer Neg Hx    Rectal cancer Neg Hx    Esophageal cancer Neg Hx    Social History   Socioeconomic History   Marital status: Married    Spouse name: Not on file   Number of children: 2   Years of education: 27   Highest education level: Not on file  Occupational History   Occupation: retired  Tobacco Use   Smoking status: Never   Smokeless tobacco: Never  Vaping Use   Vaping Use: Never used  Substance and Sexual Activity   Alcohol use: No    Alcohol/week: 0.0 standard drinks of alcohol   Drug use: No  Sexual activity: Not on file  Other Topics Concern   Not on file  Social History Narrative   Not on file   Social Determinants of Health   Financial Resource Strain: Low Risk  (12/07/2022)   Overall Financial Resource Strain (CARDIA)    Difficulty of Paying Living Expenses: Not hard at all  Food Insecurity: No Food Insecurity (12/07/2022)   Hunger Vital Sign    Worried About Running Out of Food in the Last Year: Never true    Ran Out of Food in the Last Year: Never true  Transportation Needs: No Transportation Needs (12/07/2022)   PRAPARE - Hydrologist (Medical): No    Lack of Transportation (Non-Medical): No  Physical Activity: Sufficiently Active (12/07/2022)   Exercise Vital Sign    Days of Exercise per Week: 5 days    Minutes of Exercise per Session: 30 min  Stress: No Stress Concern Present (12/07/2022)   [place]    Feeling of Stress : Not at all  Social Connections: [place] (12/07/2022)   Social Connection and Isolation Panel [NHANES]    Frequency of Communication with Friends and Family: More than three times a week    Frequency of Social Gatherings with Friends and Family: More than three times a week    Attends Religious Services: More than 4 times per year     Active Member of [employer] or Organizations: Yes    Attends Music therapist: More than 4 times per year    Marital Status: Married    Tobacco Counseling Counseling given: Not Answered   Clinical Intake:  Pre-visit preparation completed: Yes  Pain : No/denies pain Pain Score: 0-No pain     BMI - recorded: 32.23 Nutritional Status: BMI > 30  Obese Nutritional Risks: None Diabetes: No  How often do you need to have someone help you when you read instructions, pamphlets, or other written materials from your doctor or pharmacy?: 1 - Never What is the last grade level you completed in school?: HSG;  2 Master's Degree  Diabetic? no  Interpreter Needed?: No  Information entered by :: Lisette Abu, LPN.   Activities of Daily Living    12/07/2022    8:51 AM  In your present state of health, do you have any difficulty performing the following activities:  Hearing? 0  Vision? 0  Difficulty concentrating or making decisions? 0  Walking or climbing stairs? 0  Dressing or bathing? 0  Doing errands, shopping? 0  Preparing Food and eating ? N  Using the Toilet? N  In the past six months, have you accidently leaked urine? N  Do you have problems with loss of bowel control? N  Managing your Medications? N  Managing your Finances? N  Housekeeping or managing your Housekeeping? N    Patient Care Team: Hoyt Koch, MD as PCP - General (Internal Medicine)  Indicate any recent Medical Services you may have received from other than [facility] providers in the past year (date may be approximate).     Assessment:   This is a routine wellness examination for [facility].  Hearing/Vision screen Hearing Screening - Comments:: Denies hearing difficulties   Vision Screening - Comments:: Wears rx glasses - up to date with routine eye exams with [facility] and [facility]   Dietary issues and exercise activities discussed: Current Exercise Habits: Home  exercise routine, Type of exercise: walking, Time (Minutes): 30, Frequency (  Times/Week): 5, Weekly Exercise (Minutes/Week): 150, Intensity: Mild, Exercise limited by: orthopedic condition(s)   Goals Addressed   None   Depression Screen    12/07/2022    8:51 AM 06/30/2022   10:59 AM 12/06/2021    8:50 AM 10/25/2020    9:56 AM 05/15/2019   11:23 AM 08/19/2018    8:06 AM 09/13/2017    9:06 AM  PHQ 2/9 Scores  PHQ - 2 Score 0 0 0 0 0 0 0  PHQ- 9 Score  0         Fall Risk    12/07/2022    8:49 AM 06/30/2022   10:55 AM 12/06/2021    8:52 AM 10/25/2020    9:56 AM 08/19/2018    8:06 AM  Fall Risk   Falls in the past year? 1 1 0 0 No  Number falls in past yr: 1 1 0 0   Injury with Fall? 0 1 0 0   Risk for fall due to : History of fall(s);Impaired balance/gait;Orthopedic patient  No Fall Risks    Follow up Education provided;Falls prevention discussed  Falls evaluation completed      FALL RISK PREVENTION PERTAINING TO THE HOME:  Any stairs in or around the home? No  If so, are there any without handrails? No  Home free of loose throw rugs in walkways, pet beds, electrical cords, etc? Yes  Adequate lighting in your home to reduce risk of falls? Yes   ASSISTIVE DEVICES UTILIZED TO PREVENT FALLS:  Life alert? No  Use of a cane, walker or w/c? No  Grab bars in the bathroom? No  Shower chair or bench in shower? Yes  Elevated toilet seat or a handicapped toilet? Yes   TIMED UP AND GO:  Was the test performed? No . Phone Visit  Cognitive Function:        12/07/2022    8:51 AM  6CIT Screen  What Year? 0 points  What month? 0 points  What time? 0 points  Count back from 20 0 points  Months in reverse 0 points  Repeat phrase 0 points  Total Score 0 points    Immunizations Immunization History  Administered Date(s) Administered   Fluad Quad(high Dose 65+) 08/21/2019, 10/07/2020, 09/07/2021, 08/16/2022   Influenza Split 09/03/2014, 08/04/2016, 09/03/2018   Influenza Whole  11/02/1998, 09/17/2014   Influenza, High Dose Seasonal PF 08/19/2018   Influenza,inj,Quad PF,6+ Mos 09/03/2015, 10/03/2016, 09/13/2017   Influenza-Unspecified 10/02/1997, 01/26/2005, 08/04/2005, 09/03/2005, 08/08/2019, 09/03/2020   Meningococcal Polysaccharide 03/31/1999   PFIZER(Purple Top)SARS-COV-2 Vaccination 01/06/2020, 01/29/2020, 08/10/2020, 08/28/2020   PPD Test 01/11/2015   Pfizer Covid-19 Vaccine Bivalent Booster 9yr & up 08/22/2021   Pneumococcal Conjugate-13 08/19/2018   Pneumococcal Polysaccharide-23 09/03/2020, 10/07/2020   Td 12/05/1995, 03/14/2007   Tdap 12/04/2008, 06/20/2019   Tetanus 12/04/2005   Typhoid Parenteral 03/31/1999   Yellow Fever 07/24/1997   Zoster Recombinat (Shingrix) 10/02/2017, 12/11/2017, 01/02/2019, 12/12/2019   Zoster, Live 11/06/2014    TDAP status: Up to date  Flu Vaccine status: Up to date  Pneumococcal vaccine status: Up to date  Covid-19 vaccine status: Completed vaccines  Qualifies for Shingles Vaccine? Yes   Zostavax completed Yes   Shingrix Completed?: Yes  Screening Tests Health Maintenance  Topic Date Due   COVID-19 Vaccine (7 - 2023-24 season) 08/04/2022   Medicare Annual Wellness (AWV)  12/08/2023   COLONOSCOPY (Pts 45-480yrInsurance coverage will need to be confirmed)  03/07/2028   DTaP/Tdap/Td (6 - Td or Tdap) 06/19/2029  Pneumonia Vaccine 55+ Years old  Completed   INFLUENZA VACCINE  Completed   Hepatitis C Screening  Completed   Zoster Vaccines- Shingrix  Completed   HPV VACCINES  Aged Out    Health Maintenance  Health Maintenance Due  Topic Date Due   COVID-19 Vaccine (7 - 2023-24 season) 08/04/2022    Colorectal cancer screening: Type of screening: Colonoscopy. Completed 03/07/2018. Repeat every 10 years  Lung Cancer Screening: (Low Dose CT Chest recommended if Age 28-80 years, 30 pack-year currently smoking OR have quit w/in 15years.) does not qualify.   Lung Cancer Screening Referral: no  Additional  Screening:  Hepatitis C Screening: does qualify; Completed 06/09/2016  Vision Screening: Recommended annual ophthalmology exams for early detection of glaucoma and other disorders of the eye. Is the patient up to date with their annual eye exam?  Yes  Who is the provider or what is the name of the office in which the patient attends annual eye exams? [facility] and [facility] If pt is not established with a provider, would they like to be referred to a provider to establish care? No .   Dental Screening: Recommended annual dental exams for proper oral hygiene  Community Resource Referral / Chronic Care Management: CRR required this visit?  No   CCM required this visit?  No      Plan:     I have personally reviewed and noted the following in the patient's chart:   Medical and social history Use of alcohol, tobacco or illicit drugs  Current medications and supplements including opioid prescriptions. Patient is not currently taking opioid prescriptions. Functional ability and status Nutritional status Physical activity Advanced directives List of other physicians Hospitalizations, surgeries, and ER visits in previous 12 months Vitals Screenings to include cognitive, depression, and falls Referrals and appointments  In addition, I have reviewed and discussed with patient certain preventive protocols, quality metrics, and best practice recommendations. A written personalized care plan for preventive services as well as general preventive health recommendations were provided to patient.     Sheral Flow, LPN   08/12/2425   Nurse Notes: N/A

## 2022-12-13 ENCOUNTER — Ambulatory Visit (INDEPENDENT_AMBULATORY_CARE_PROVIDER_SITE_OTHER): Payer: Medicare Other | Admitting: Internal Medicine

## 2022-12-13 ENCOUNTER — Encounter: Payer: Self-pay | Admitting: Internal Medicine

## 2022-12-13 VITALS — BP 140/82 | HR 59 | Temp 98.0°F | Ht 68.0 in | Wt 223.0 lb

## 2022-12-13 DIAGNOSIS — M79672 Pain in left foot: Secondary | ICD-10-CM | POA: Diagnosis not present

## 2022-12-13 DIAGNOSIS — A084 Viral intestinal infection, unspecified: Secondary | ICD-10-CM

## 2022-12-13 NOTE — Patient Instructions (Signed)
We will get you in with the podiatrist.

## 2022-12-13 NOTE — Assessment & Plan Note (Signed)
Overall not improving. Suspect some gait changes since low back pain and left leg intermittent weakness. May need orthotic and referral done to podiatry. He has also recently gotten injection in low spine which hopefully will help resolve left leg issues.

## 2022-12-13 NOTE — Assessment & Plan Note (Signed)
No recurrence will resolve.

## 2022-12-13 NOTE — Progress Notes (Signed)
   Subjective:   Patient ID: Hector [name], male    DOB: 1950/12/22, 72 y.o.   MRN: [id number]  HPI The patient is a 72 YO man coming in for follow up urgent care. Had illness December with URI and cough persistent. Urgent care gave prednisone and albuterol and tessalon perles. Doing well. Left foot pain still.   Review of Systems  Constitutional: Negative.   HENT: Negative.    Eyes: Negative.   Respiratory:  Negative for cough, chest tightness and shortness of breath.   Cardiovascular:  Negative for chest pain, palpitations and leg swelling.  Gastrointestinal:  Negative for abdominal distention, abdominal pain, constipation, diarrhea, nausea and vomiting.  Musculoskeletal: Negative.   Skin: Negative.   Neurological: Negative.   Psychiatric/Behavioral: Negative.      Objective:  Physical Exam Constitutional:      Appearance: He is well-developed.  HENT:     Head: Normocephalic and atraumatic.  Cardiovascular:     Rate and Rhythm: Normal rate and regular rhythm.  Pulmonary:     Effort: Pulmonary effort is normal. No respiratory distress.     Breath sounds: Normal breath sounds. No wheezing or rales.  Abdominal:     General: Bowel sounds are normal. There is no distension.     Palpations: Abdomen is soft.     Tenderness: There is no abdominal tenderness. There is no rebound.  Musculoskeletal:        General: Tenderness present.     Cervical back: Normal range of motion.     Comments: Left foot tender along the midfoot  Skin:    General: Skin is warm and dry.  Neurological:     Mental Status: He is alert and oriented to person, place, and time.     Coordination: Coordination normal.     Vitals:   12/13/22 0843  BP: (!) 140/82  Pulse: (!) 59  Temp: 98 F (36.7 C)  TempSrc: Oral  SpO2: 99%  Weight: 223 lb (101.2 kg)  Height: '5\' 8"'$  (1.727 m)    Assessment & Plan:

## 2022-12-25 ENCOUNTER — Ambulatory Visit (INDEPENDENT_AMBULATORY_CARE_PROVIDER_SITE_OTHER): Payer: Medicare Other

## 2022-12-25 ENCOUNTER — Encounter: Payer: Self-pay | Admitting: Podiatry

## 2022-12-25 ENCOUNTER — Ambulatory Visit (INDEPENDENT_AMBULATORY_CARE_PROVIDER_SITE_OTHER): Payer: Medicare Other | Admitting: Podiatry

## 2022-12-25 DIAGNOSIS — M79672 Pain in left foot: Secondary | ICD-10-CM | POA: Diagnosis not present

## 2022-12-25 DIAGNOSIS — M76812 Anterior tibial syndrome, left leg: Secondary | ICD-10-CM | POA: Diagnosis not present

## 2022-12-25 MED ORDER — TRIAMCINOLONE ACETONIDE 10 MG/ML IJ SUSP
10.0000 mg | Freq: Once | INTRAMUSCULAR | Status: AC
  Administered 2022-12-25: 10 mg

## 2022-12-25 NOTE — Progress Notes (Signed)
Subjective:   Patient ID: Hector [name], male   DOB: 72 y.o.   MRN: [id number]   HPI Patient presents stating he has had around 6 months intense discomfort on the medial side of the left foot and states that it is making it hard for him to be active or do active activities that he likes.  Patient has not noted swelling does not smoke and does like to be active   Review of Systems  All other systems reviewed and are negative.       Objective:  Physical Exam Vitals and nursing note reviewed.  Constitutional:      Appearance: He is well-developed.  Pulmonary:     Effort: Pulmonary effort is normal.  Musculoskeletal:        General: Normal range of motion.  Skin:    General: Skin is warm.  Neurological:     Mental Status: He is alert.     Neurovascular status intact muscle strength adequate range of motion adequate with patient noted to have exquisite discomfort at the anterior tibial insertion left with inflammation pain that is been present for around 6 months.  No indication muscle strength loss or damage with patient found to have good digital perfusion well-oriented x 3     Assessment:  Appears to be acute anterior tibial tendinitis left but also somewhat chronic due to 6 months duration     Plan:  H&P reviewed condition went ahead today did sterile prep and did careful injection of the medial side 3 mg dexamethasone Kenalog 5 mg Xylocaine after extensive Splane the chances for rupture.  Applied air fracture walker to completely immobilize advised on ice therapy and patient will be seen back to recheck again in 3 weeks  X-rays were negative for signs of bony injury around this area does indicate moderate flatfoot deformity and indications of possible subtalar joint issues

## 2022-12-26 ENCOUNTER — Other Ambulatory Visit: Payer: Self-pay | Admitting: Podiatry

## 2022-12-26 DIAGNOSIS — M76812 Anterior tibial syndrome, left leg: Secondary | ICD-10-CM

## 2022-12-26 DIAGNOSIS — M79672 Pain in left foot: Secondary | ICD-10-CM

## 2023-01-15 ENCOUNTER — Ambulatory Visit (INDEPENDENT_AMBULATORY_CARE_PROVIDER_SITE_OTHER): Payer: Medicare Other | Admitting: Podiatry

## 2023-01-15 DIAGNOSIS — M205X2 Other deformities of toe(s) (acquired), left foot: Secondary | ICD-10-CM

## 2023-01-15 NOTE — Progress Notes (Signed)
Subjective:   Patient ID: Hector [name], male   DOB: 72 y.o.   MRN: [id number]   HPI Patient presents stating the midfoot feels much better but he is having a lot of problems with his big toe joint left stating it has been there for a number of years and he just did not think to talk about it as he was so focused on the other problem.  States he has trouble walking with it and it gets sore with ambulation   ROS      Objective:  Physical Exam  Neurovascular status intact muscle strength found to be adequate range of motion adequate with patient found to have left reduction of discomfort around the anterior tibial tendon but does have reduced range of motion with mild crepitus in the first MPJ left and it is sore when palpated and has been present for a number of years and is causing change in his gait     Assessment:  Improvement anterior tibial tendinitis left with hallux limitus with the possibility for cartilage damage of the first MPJ left foot with reduced motion     Plan:  H&P reviewed both conditions for anterior tibialis do not recommend further treatment but I did discuss the continued usage of boot as needed.  For the other due to the chronic nature of the problem and after reviewing x-rays I do think that we could consider anti-inflammatories or we could consider surgery and I did discuss with him what would be necessary.  He is opted for surgery I do think this would do well for him and I discussed possible osteotomy with removal of bone spurs or if I find that the cartilage is in bad shape but will require implant procedure.  He wants procedure I allowed him to read and signed consent form after reviewing alternative treatments complications and after reviewing patient's side understands total recovery can take upwards of 6 months with no long-term guarantees and he will wear the boot most likely 3 to 4 weeks after the procedure

## 2023-01-17 ENCOUNTER — Other Ambulatory Visit: Payer: Self-pay | Admitting: Internal Medicine

## 2023-01-29 ENCOUNTER — Telehealth: Payer: Self-pay

## 2023-01-29 NOTE — Telephone Encounter (Signed)
Hector [name] called to cancel his surgery with Dr. Paulla Dolly on 02/13/2023. He stated he is having issues with his back that he wants to take care of before having foot surgery. He will call me back to reschedule. Notified Dr. Paulla Dolly and Caren Griffins with [place].

## 2023-02-10 ENCOUNTER — Ambulatory Visit
Admission: RE | Admit: 2023-02-10 | Discharge: 2023-02-10 | Disposition: A | Discharge status: Home / Self Care | Payer: Medicare Other | Source: Ambulatory Visit | Attending: Internal Medicine | Admitting: Internal Medicine

## 2023-02-10 VITALS — BP 102/68 | HR 80 | Temp 98.0°F | Resp 20

## 2023-02-10 DIAGNOSIS — J111 Influenza due to unidentified influenza virus with other respiratory manifestations: Secondary | ICD-10-CM

## 2023-02-10 DIAGNOSIS — R051 Acute cough: Secondary | ICD-10-CM

## 2023-02-10 MED ORDER — BENZONATATE 100 MG PO CAPS: 100.0000 mg | ORAL_CAPSULE | Freq: Three times a day (TID) | ORAL | 0 refills | Status: DC | PRN

## 2023-02-10 MED ORDER — ALBUTEROL SULFATE HFA 108 (90 BASE) MCG/ACT IN AERS: 2.0000 | INHALATION_SPRAY | Freq: Four times a day (QID) | RESPIRATORY_TRACT | 0 refills | Status: DC | PRN

## 2023-02-10 NOTE — ED Provider Notes (Signed)
[name]    CSN: [id number] Arrival date & time: 02/10/23  1152      History   Chief Complaint Chief Complaint  Patient presents with   Influenza    Entered by patient   Cough   Nasal Congestion   Fever    HPI Hector [name] is a 72 y.o. male.   Patient presents with approximately 5 to 6-day history of cough, fever, nasal congestion.  Temp max at home was 101 with last fever being yesterday.  Patient reports that fevers are responsive to antipyretics.  Patient reports that he has also noticed some wheezing intermittently.  Denies any current chest pain or shortness of breath.  Patient has taken NyQuil and DayQuil for symptoms.  Reports his wife recently tested positive for influenza A.  Denies history of asthma or COPD and patient does not smoke cigarettes.   Influenza Cough Fever   Past Medical History:  Diagnosis Date   Adhesive capsulitis 07/31/2018   Allergy    Arthritis    Bladder cancer ([place])    Chronic kidney disease    kidney stones   GERD (gastroesophageal reflux disease)    History of kidney stones    Hyperlipidemia    Hyperparathyroidism ([place])    Hypertension     Patient Active Problem List   Diagnosis Date Noted   Left foot pain 06/30/2022   Numbness and tingling of both legs 07/25/2021   Chronic left shoulder pain 11/12/2020   Chronic 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 03/24/2016   Hyperparathyroidism ([place]) 04/13/2015   Vitamin D deficiency 04/09/2015   Osteoarthritis 11/06/2014   Essential hypertension 11/06/2014   Hyperlipidemia 11/06/2014   GERD (gastroesophageal reflux disease) 11/06/2014   Syncope 11/06/2014   Routine general medical examination at a health care facility 11/06/2014    Past Surgical History:  Procedure Laterality Date   COLONOSCOPY     CYSTOSCOPY N/A 09/16/2020   Procedure: CYSTOSCOPY;  Surgeon: Franchot Gallo, MD;  Location: [facility]  Service: Urology;   Laterality: N/A;   PARATHYROIDECTOMY Right 09/20/2021   Procedure: RIGHT INFERIOR PARATHYROIDECTOMY;  Surgeon: Armandina Gemma, MD;  Location: [facility];  Service: General;  Laterality: Right;  77   POLYPECTOMY     PROSTATE SURGERY  12/2012   no cancer   ROTATOR CUFF REPAIR Right 2019   TRANSURETHRAL RESECTION OF BLADDER NECK N/A 09/16/2020   Procedure: TRANSURETHRAL INCISION OF BLADDER NECK CONTRACTURE;  Surgeon: Franchot Gallo, MD;  Location: [facility];  Service: Urology;  Laterality: N/A;  73 MINS   TRANSURETHRAL RESECTION OF BLADDER TUMOR WITH MITOMYCIN-C N/A 09/16/2020   Procedure: TRANSURETHRAL RESECTION OF BLADDER TUMOR WITH post op  GEMCITABINE;  Surgeon: Franchot Gallo, MD;  Location: [facility];  Service: Urology;  Laterality: N/A;   WISDOM TOOTH EXTRACTION         Home Medications    Prior to Admission medications   Medication Sig Start Date End Date Taking? Authorizing Provider  benzonatate (TESSALON) 100 MG capsule Take 1 capsule (100 mg total) by mouth every 8 (eight) hours as needed for cough. 02/10/23  Yes Tanesia Butner, Michele Rockers, FNP  albuterol (VENTOLIN HFA) 108 (90 Base) MCG/ACT inhaler Inhale 2 puffs into the lungs every 6 (six) hours as needed for wheezing or shortness of breath. 02/10/23   Teodora Medici, FNP  amLODipine (NORVASC) 5 MG tablet Take 5 mg by mouth daily.    [provider]  aspirin EC  81 MG tablet Take 81 mg by mouth daily. Swallow whole.    [provider]  cetirizine (ZYRTEC) 10 MG tablet Take 10 mg by mouth daily.    [provider]  cholecalciferol (VITAMIN D3) 25 MCG (1000 UNIT) tablet Take 1,000 Units by mouth daily.    [provider]  fluticasone Asencion Islam) 50 MCG/ACT nasal spray USE 2 SPRAYS IN [facility] NOSTRIL DAILY 05/16/22   Hoyt Koch, MD  gabapentin (NEURONTIN) 300 MG capsule Take 1 capsule (300 mg total) by mouth 3 (three) times daily. 07/06/22   Gregor Hams, MD  loratadine  (CLARITIN) 10 MG tablet Take 10 mg by mouth daily.    [provider]  losartan (COZAAR) 100 MG tablet TAKE 1 TABLET DAILY 01/17/23   Hoyt Koch, MD  lovastatin (MEVACOR) 20 MG tablet Take 20 mg by mouth at bedtime.    [provider]  meloxicam (MOBIC) 15 MG tablet Take 15 mg by mouth daily.     [provider]  pantoprazole (PROTONIX) 40 MG tablet TAKE 1 TABLET DAILY 08/04/22   Hoyt Koch, MD  tamsulosin (FLOMAX) 0.4 MG CAPS capsule Take 1 capsule (0.4 mg total) by mouth daily. 11/15/20   Veryl Speak, MD  terazosin (HYTRIN) 10 MG capsule Take 10 mg by mouth at bedtime.     [provider]  triamcinolone cream (KENALOG) 0.1 % Apply topically twice a day. 08/22/22   Hoyt Koch, MD    Family History Family History  Problem Relation Age of Onset   Arthritis Mother    Hyperlipidemia Mother    Heart disease Mother    Stomach cancer Mother    Colon polyps Mother    Arthritis Father    Hyperlipidemia Father    Heart disease Father    Kidney disease Father    Breast cancer Sister    Hypertension Sister    Ovarian cancer Sister    Gallbladder disease Sister        x 6   Colon cancer Neg Hx    Liver cancer Neg Hx    Rectal cancer Neg Hx    Esophageal cancer Neg Hx     Social History Social History   Tobacco Use   Smoking status: Never   Smokeless tobacco: Never  Vaping Use   Vaping Use: Never used  Substance Use Topics   Alcohol use: No    Alcohol/week: 0.0 standard drinks of alcohol   Drug use: No     Allergies   Lisinopril and Shellfish allergy   Review of Systems Review of Systems Per HPI  Physical Exam Triage Vital Signs ED Triage Vitals  Enc Vitals Group     BP 02/10/23 1217 102/68     Pulse Rate 02/10/23 1217 80     Resp 02/10/23 1217 20     Temp 02/10/23 1217 98 F (36.7 C)     Temp Source 02/10/23 1217 Oral     SpO2 02/10/23 1217 96 %     Weight --      Height --      Head  Circumference --      Peak Flow --      Pain Score 02/10/23 1227 0     Pain Loc --      Pain Edu? --      Excl. in [place]? --    No data found.  Updated Vital Signs BP 102/68 (BP Location: Left Arm)   Pulse 80  Temp 98 F (36.7 C) (Oral)   Resp 20   SpO2 96%   Visual Acuity Right Eye Distance:   Left Eye Distance:   Bilateral Distance:    Right Eye Near:   Left Eye Near:    Bilateral Near:     Physical Exam Constitutional:      General: He is not in acute distress.    Appearance: Normal appearance. He is not toxic-appearing or diaphoretic.  HENT:     Head: Normocephalic and atraumatic.     Right Ear: Tympanic membrane and ear canal normal.     Left Ear: Tympanic membrane and ear canal normal.     Nose: Congestion present.     Mouth/Throat:     Mouth: Mucous membranes are moist.     Pharynx: No posterior oropharyngeal erythema.  Eyes:     Extraocular Movements: Extraocular movements intact.     Conjunctiva/sclera: Conjunctivae normal.     Pupils: Pupils are equal, round, and reactive to light.  Cardiovascular:     Rate and Rhythm: Normal rate and regular rhythm.     Pulses: Normal pulses.     Heart sounds: Normal heart sounds.  Pulmonary:     Effort: Pulmonary effort is normal. No respiratory distress.     Breath sounds: Normal breath sounds. No stridor. No wheezing, rhonchi or rales.  Abdominal:     General: Abdomen is flat. Bowel sounds are normal.     Palpations: Abdomen is soft.  Musculoskeletal:        General: Normal range of motion.     Cervical back: Normal range of motion.  Skin:    General: Skin is warm and dry.  Neurological:     General: No focal deficit present.     Mental Status: He is alert and oriented to person, place, and time. Mental status is at baseline.  Psychiatric:        Mood and Affect: Mood normal.        Behavior: Behavior normal.      UC Treatments / Results  Labs (all labs ordered are listed, but only abnormal results are  displayed) Labs Reviewed - No data to display  EKG   Radiology No results found.  Procedures Procedures (including critical care time)  Medications Ordered in UC Medications - No data to display  Initial Impression / Assessment and Plan / UC Course  I have reviewed the triage vital signs and the nursing notes.  Pertinent labs & imaging results that were available during my care of the patient were reviewed by me and considered in my medical decision making (see chart for details).     Patient presents with symptoms likely from a viral upper respiratory infection.  Do not suspect underlying cardiopulmonary process. Symptoms seem unlikely related to ACS, CHF or COPD exacerbations, pneumonia, pneumothorax. Patient is nontoxic appearing and not in need of emergent medical intervention.  Most likely influenza given patient's close exposure.  Will defer viral testing given duration of symptoms as it would not change treatment.  There are no adventitious lung sounds on exam so do not think that chest imaging is necessary.  Recommended symptom control with medications and supportive care.  Albuterol inhaler refilled for patient given patient's reported wheezing.  Return if symptoms fail to improve. Patient states understanding and is agreeable.  Discharged with PCP followup.  Final Clinical Impressions(s) / UC Diagnoses   Final diagnoses:  Influenza  Acute cough     Discharge Instructions  Your most likely have the flu given your close exposure.  I have refilled your albuterol inhaler and sent you a cough medication.  Please follow-up if any symptoms persist or worsen.    ED Prescriptions     Medication Sig Dispense Auth. Provider   albuterol (VENTOLIN HFA) 108 (90 Base) MCG/ACT inhaler Inhale 2 puffs into the lungs every 6 (six) hours as needed for wheezing or shortness of breath. 1 each [place], [place] E, [place]   benzonatate (TESSALON) 100 MG capsule Take 1 capsule (100 mg  total) by mouth every 8 (eight) hours as needed for cough. 21 capsule [place], Michele Rockers, [place]      PDMP not reviewed this encounter.   Teodora Medici, [place] 02/10/23 1339

## 2023-02-10 NOTE — Discharge Instructions (Signed)
Your most likely have the flu given your close exposure.  I have refilled your albuterol inhaler and sent you a cough medication.  Please follow-up if any symptoms persist or worsen.

## 2023-02-10 NOTE — ED Triage Notes (Signed)
Patient presents to UC for cough, fever, nasal congestion since Monday. Wheezing since Tuesday. Last known fever Thursday. Taking nyquil.

## 2023-02-19 ENCOUNTER — Ambulatory Visit: Payer: Medicare Other

## 2023-03-05 ENCOUNTER — Ambulatory Visit: Payer: Medicare Other

## 2023-03-15 ENCOUNTER — Ambulatory Visit (INDEPENDENT_AMBULATORY_CARE_PROVIDER_SITE_OTHER): Payer: Medicare Other | Admitting: Internal Medicine

## 2023-03-15 ENCOUNTER — Encounter: Payer: Self-pay | Admitting: Internal Medicine

## 2023-03-15 VITALS — BP 135/78 | HR 62 | Temp 98.2°F | Ht 68.0 in | Wt 214.0 lb

## 2023-03-15 DIAGNOSIS — M79672 Pain in left foot: Secondary | ICD-10-CM

## 2023-03-15 DIAGNOSIS — I10 Essential (primary) hypertension: Secondary | ICD-10-CM

## 2023-03-15 DIAGNOSIS — R202 Paresthesia of skin: Secondary | ICD-10-CM | POA: Diagnosis not present

## 2023-03-15 DIAGNOSIS — R200 Anesthesia of skin: Secondary | ICD-10-CM | POA: Diagnosis not present

## 2023-03-15 NOTE — Progress Notes (Signed)
   Subjective:   Patient ID: Hector [name], male    DOB: 10-04-1951, 72 y.o.   MRN: [id number]  HPI The patient is a 72 YO man coming in for BP follow up   Review of Systems  Constitutional: Negative.   HENT: Negative.    Eyes: Negative.   Respiratory:  Negative for cough, chest tightness and shortness of breath.   Cardiovascular:  Negative for chest pain, palpitations and leg swelling.  Gastrointestinal:  Negative for abdominal distention, abdominal pain, constipation, diarrhea, nausea and vomiting.  Musculoskeletal:  Positive for arthralgias and back pain.  Skin: Negative.   Neurological: Negative.   Psychiatric/Behavioral: Negative.      Objective:  Physical Exam Constitutional:      Appearance: He is well-developed.  HENT:     Head: Normocephalic and atraumatic.  Cardiovascular:     Rate and Rhythm: Normal rate and regular rhythm.  Pulmonary:     Effort: Pulmonary effort is normal. No respiratory distress.     Breath sounds: Normal breath sounds. No wheezing or rales.  Abdominal:     General: Bowel sounds are normal. There is no distension.     Palpations: Abdomen is soft.     Tenderness: There is no abdominal tenderness. There is no rebound.  Musculoskeletal:        General: Tenderness present.     Cervical back: Normal range of motion.  Skin:    General: Skin is warm and dry.  Neurological:     Mental Status: He is alert and oriented to person, place, and time.     Coordination: Coordination normal.     Vitals:   03/15/23 0843 03/15/23 0845 03/15/23 0900  BP: (!) 140/86 (!) 140/86 135/78  Pulse: 62    Temp: 98.2 F (36.8 C)    TempSrc: Oral    SpO2: 99%    Weight: 214 lb (97.1 kg)    Height: 5\' 8"  (1.727 m)      Assessment & Plan:

## 2023-03-16 NOTE — Assessment & Plan Note (Signed)
BP at goal and monitors at home and is at goal at home as well. Taking amlodipine 5 mg daily and losartan 100 mg daily. Recent labs stable.

## 2023-03-16 NOTE — Assessment & Plan Note (Signed)
He was offered surgery to low back and decided to avoid for now. Will pursue foot surgery and see if better gait impacts his low back pain.

## 2023-03-16 NOTE — Assessment & Plan Note (Signed)
May be having surgery on his foot soon and we discussed likely outcomes and how this is likely worsening his lower back problems.

## 2023-04-05 DIAGNOSIS — H35013 Changes in retinal vascular appearance, bilateral: Secondary | ICD-10-CM | POA: Diagnosis not present

## 2023-04-05 DIAGNOSIS — H2513 Age-related nuclear cataract, bilateral: Secondary | ICD-10-CM | POA: Diagnosis not present

## 2023-04-05 DIAGNOSIS — H40013 Open angle with borderline findings, low risk, bilateral: Secondary | ICD-10-CM | POA: Diagnosis not present

## 2023-04-05 DIAGNOSIS — H35033 Hypertensive retinopathy, bilateral: Secondary | ICD-10-CM | POA: Diagnosis not present

## 2023-05-02 ENCOUNTER — Ambulatory Visit
Admission: RE | Admit: 2023-05-02 | Discharge: 2023-05-02 | Disposition: A | Discharge status: Home / Self Care | Payer: Medicare Other | Source: Ambulatory Visit | Attending: Internal Medicine | Admitting: Internal Medicine

## 2023-05-02 VITALS — BP 146/87 | HR 55 | Temp 97.6°F | Resp 20

## 2023-05-02 DIAGNOSIS — H9201 Otalgia, right ear: Secondary | ICD-10-CM

## 2023-05-02 DIAGNOSIS — R519 Headache, unspecified: Secondary | ICD-10-CM | POA: Diagnosis not present

## 2023-05-02 MED ORDER — AMOXICILLIN-POT CLAVULANATE 875-125 MG PO TABS: 1.0000 | ORAL_TABLET | Freq: Two times a day (BID) | ORAL | 0 refills | Status: DC

## 2023-05-02 NOTE — ED Provider Notes (Signed)
Hector [name]    CSN: [id number] Arrival date & time: 05/02/23  0850      History   Chief Complaint Chief Complaint  Patient presents with   Facial Pain    Pain in jaw joint area, ear, eye and sinus on right side since Saturday. - Entered by patient    HPI MAICO WECKER is a 72 y.o. male.   Patient presents with pain on the right side of his face that started about 4 days ago.  Patient reports that it seems to start on his right posterior jaw and radiates up to his ear and up to his cheek of his face directly under eye.  He does have nasal congestion but reports this is present all the time.  Denies fever, any known sick contacts, coughing.  Patient has taken Tylenol with minimal improvement in symptoms.  Denies any dental pain.     Past Medical History:  Diagnosis Date   Adhesive capsulitis 07/31/2018   Allergy    Arthritis    Bladder cancer ([facility])    Chronic kidney disease    kidney stones   GERD (gastroesophageal reflux disease)    History of kidney stones    Hyperlipidemia    Hyperparathyroidism ([facility])    Hypertension     Patient Active Problem List   Diagnosis Date Noted   Left foot pain 06/30/2022   Numbness and tingling of both legs 07/25/2021   Chronic left shoulder pain 11/12/2020   Chronic 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 03/24/2016   Hyperparathyroidism ([facility]) 04/13/2015   Vitamin D deficiency 04/09/2015   Osteoarthritis 11/06/2014   Essential hypertension 11/06/2014   Hyperlipidemia 11/06/2014   GERD (gastroesophageal reflux disease) 11/06/2014   Syncope 11/06/2014   Routine general medical examination at a health [name] facility 11/06/2014    Past Surgical History:  Procedure Laterality Date   COLONOSCOPY     CYSTOSCOPY N/A 09/16/2020   Procedure: CYSTOSCOPY;  Surgeon: Marcine Matar, MD;  Location: [facility];  Service: Urology;  Laterality: N/A;   PARATHYROIDECTOMY Right 09/20/2021   Procedure:  RIGHT INFERIOR PARATHYROIDECTOMY;  Surgeon: Darnell Level, MD;  Location: [facility];  Service: General;  Laterality: Right;  77   POLYPECTOMY     PROSTATE SURGERY  12/2012   no cancer   ROTATOR CUFF REPAIR Right 2019   TRANSURETHRAL RESECTION OF BLADDER NECK N/A 09/16/2020   Procedure: TRANSURETHRAL INCISION OF BLADDER NECK CONTRACTURE;  Surgeon: Marcine Matar, MD;  Location: [facility];  Service: Urology;  Laterality: N/A;  47 MINS   TRANSURETHRAL RESECTION OF BLADDER TUMOR WITH MITOMYCIN-C N/A 09/16/2020   Procedure: TRANSURETHRAL RESECTION OF BLADDER TUMOR WITH post op  GEMCITABINE;  Surgeon: Marcine Matar, MD;  Location: [facility];  Service: Urology;  Laterality: N/A;   WISDOM TOOTH EXTRACTION         Home Medications    Prior to Admission medications   Medication Sig Start Date End Date Taking? Authorizing Provider  amoxicillin-clavulanate (AUGMENTIN) 875-125 MG tablet Take 1 tablet by mouth every 12 (twelve) hours. 05/02/23  Yes Andrell Bergeson, Acie Fredrickson, FNP  albuterol (VENTOLIN HFA) 108 (90 Base) MCG/ACT inhaler Inhale 2 puffs into the lungs every 6 (six) hours as needed for wheezing or shortness of breath. 02/10/23   Gustavus Bryant, FNP  amLODipine (NORVASC) 5 MG tablet Take 5 mg by mouth daily.    [provider]  aspirin EC 81 MG tablet Take 81 mg by  mouth daily. Swallow whole.    [provider]  benzonatate (TESSALON) 100 MG capsule Take 1 capsule (100 mg total) by mouth every 8 (eight) hours as needed for cough. 02/10/23   Gustavus Bryant, FNP  cetirizine (ZYRTEC) 10 MG tablet Take 10 mg by mouth daily.    [provider]  cholecalciferol (VITAMIN D3) 25 MCG (1000 UNIT) tablet Take 1,000 Units by mouth daily.    [provider]  fluticasone Aleda Grana) 50 MCG/ACT nasal spray USE 2 SPRAYS IN [facility] NOSTRIL DAILY 05/16/22   Myrlene Broker, MD  gabapentin (NEURONTIN) 300 MG capsule Take 1 capsule (300 mg total) by mouth  3 (three) times daily. 07/06/22   Rodolph Bong, MD  loratadine (CLARITIN) 10 MG tablet Take 10 mg by mouth daily.    [provider]  losartan (COZAAR) 100 MG tablet TAKE 1 TABLET DAILY 01/17/23   Myrlene Broker, MD  lovastatin (MEVACOR) 20 MG tablet Take 20 mg by mouth at bedtime.    [provider]  meloxicam (MOBIC) 15 MG tablet Take 15 mg by mouth daily.     [provider]  pantoprazole (PROTONIX) 40 MG tablet TAKE 1 TABLET DAILY 08/04/22   Myrlene Broker, MD  tamsulosin (FLOMAX) 0.4 MG CAPS capsule Take 1 capsule (0.4 mg total) by mouth daily. 11/15/20   Geoffery Lyons, MD  terazosin (HYTRIN) 10 MG capsule Take 10 mg by mouth at bedtime.     [provider]  triamcinolone cream (KENALOG) 0.1 % Apply topically twice a day. 08/22/22   Myrlene Broker, MD    Family History Family History  Problem Relation Age of Onset   Arthritis Mother    Hyperlipidemia Mother    Heart disease Mother    Stomach cancer Mother    Colon polyps Mother    Arthritis Father    Hyperlipidemia Father    Heart disease Father    Kidney disease Father    Breast cancer Sister    Hypertension Sister    Ovarian cancer Sister    Gallbladder disease Sister        x 6   Colon cancer Neg Hx    Liver cancer Neg Hx    Rectal cancer Neg Hx    Esophageal cancer Neg Hx     Social History Social History   Tobacco Use   Smoking status: Never   Smokeless tobacco: Never  Vaping Use   Vaping Use: Never used  Substance Use Topics   Alcohol use: No    Alcohol/week: 0.0 standard drinks of alcohol   Drug use: No     Allergies   Lisinopril and Shellfish allergy   Review of Systems Review of Systems Per HPI  Physical Exam Triage Vital Signs ED Triage Vitals  Enc Vitals Group     BP 05/02/23 0912 (!) 146/87     Pulse Rate 05/02/23 0912 (!) 55     Resp 05/02/23 0912 20     Temp 05/02/23 0912 97.6 F (36.4 C)     Temp Source 05/02/23 0912 Oral      SpO2 05/02/23 0912 98 %     Weight --      Height --      Head Circumference --      Peak Flow --      Pain Score 05/02/23 0913 6     Pain Loc --      Pain Edu? --  Excl. in [place]? --    No data found.  Updated Vital Signs BP (!) 146/87 (BP Location: Left Arm)   Pulse (!) 55   Temp 97.6 F (36.4 C) (Oral)   Resp 20   SpO2 98%   Visual Acuity Right Eye Distance:   Left Eye Distance:   Bilateral Distance:    Right Eye Near:   Left Eye Near:    Bilateral Near:     Physical Exam Constitutional:      General: He is not in acute distress.    Appearance: Normal appearance. He is not toxic-appearing or diaphoretic.  HENT:     Head: Normocephalic and atraumatic.      Comments: Patient has tenderness to palpation to right lateral jaw and area overlying maxillary sinuses.    Right Ear: Ear canal and external ear normal. No drainage, swelling or tenderness. A middle ear effusion is present. Tympanic membrane is not perforated, erythematous or bulging.     Left Ear: Ear canal and external ear normal. No drainage, swelling or tenderness. A middle ear effusion is present. Tympanic membrane is not perforated, erythematous or bulging.     Nose: Nose normal.     Mouth/Throat:     Lips: Pink.     Dentition: Normal dentition. Does not have dentures. No dental tenderness, gingival swelling or dental abscesses.     Pharynx: No oropharyngeal exudate, posterior oropharyngeal erythema or uvula swelling.  Eyes:     Extraocular Movements: Extraocular movements intact.     Conjunctiva/sclera: Conjunctivae normal.  Cardiovascular:     Rate and Rhythm: Normal rate and regular rhythm.     Pulses: Normal pulses.     Heart sounds: Normal heart sounds.  Pulmonary:     Effort: Pulmonary effort is normal. No respiratory distress.     Breath sounds: Normal breath sounds. No stridor. No wheezing, rhonchi or rales.  Neurological:     General: No focal deficit present.     Mental Status: He is alert  and oriented to person, place, and time. Mental status is at baseline.  Psychiatric:        Mood and Affect: Mood normal.        Behavior: Behavior normal.        Thought Content: Thought content normal.        Judgment: Judgment normal.      UC Treatments / Results  Labs (all labs ordered are listed, but only abnormal results are displayed) Labs Reviewed - No data to display  EKG   Radiology No results found.  Procedures Procedures (including critical [name] time)  Medications Ordered in UC Medications - No data to display  Initial Impression / Assessment and Plan / UC Course  I have reviewed the triage vital signs and the nursing notes.  Pertinent labs & imaging results that were available during my [name] of the patient were reviewed by me and considered in my medical decision making (see chart for details).     Differential diagnoses include parotid gland infection versus sinus infection versus dental infection.  No obvious signs of dental infection on exam.  Will cover for both sinus infection and parotid gland infection with Augmentin antibiotic.  Low suspicion for parotid gland infection given there is no obvious swelling but Augmentin will help cover this in case it is present.  Advised patient to follow-up with family medicine doctor or urgent [name] if symptoms persist or worsen in the next 48-72 hours.  Patient verbalized understanding  and was agreeable with plan. Final Clinical Impressions(s) / UC Diagnoses   Final diagnoses:  Facial pain  Acute otalgia, right     Discharge Instructions      I have prescribed you an antibiotic to treat infection and your symptoms.  Please follow-up if any symptoms persist or worsen.    ED Prescriptions     Medication Sig Dispense Auth. Provider   amoxicillin-clavulanate (AUGMENTIN) 875-125 MG tablet Take 1 tablet by mouth every 12 (twelve) hours. 14 tablet [place], Acie Fredrickson, [place]      PDMP not reviewed this encounter.    Gustavus Bryant, [place] 05/02/23 [phone]

## 2023-05-02 NOTE — Discharge Instructions (Signed)
I have prescribed you an antibiotic to treat infection and your symptoms.  Please follow-up if any symptoms persist or worsen.

## 2023-05-02 NOTE — ED Triage Notes (Signed)
Pt reports the right side of his jaw line, head, and ear is  is painful x 4 days. Took tylenol but no relief.

## 2023-05-04 ENCOUNTER — Emergency Department (HOSPITAL_COMMUNITY): Payer: Medicare Other

## 2023-05-04 ENCOUNTER — Ambulatory Visit: Payer: Medicare Other

## 2023-05-04 ENCOUNTER — Encounter (HOSPITAL_COMMUNITY): Payer: Self-pay

## 2023-05-04 ENCOUNTER — Other Ambulatory Visit: Payer: Self-pay

## 2023-05-04 ENCOUNTER — Emergency Department (HOSPITAL_COMMUNITY)
Admission: EM | Admit: 2023-05-04 | Discharge: 2023-05-04 | Disposition: A | Discharge status: Home / Self Care | Payer: Medicare Other | Attending: Emergency Medicine | Admitting: Emergency Medicine

## 2023-05-04 DIAGNOSIS — R519 Headache, unspecified: Secondary | ICD-10-CM | POA: Diagnosis not present

## 2023-05-04 DIAGNOSIS — Z8551 Personal history of malignant neoplasm of bladder: Secondary | ICD-10-CM | POA: Diagnosis not present

## 2023-05-04 DIAGNOSIS — Z7982 Long term (current) use of aspirin: Secondary | ICD-10-CM | POA: Diagnosis not present

## 2023-05-04 DIAGNOSIS — G500 Trigeminal neuralgia: Secondary | ICD-10-CM | POA: Diagnosis not present

## 2023-05-04 LAB — I-STAT CHEM 8, ED
BUN: 18 mg/dL (ref 8–23)
Calcium, Ion: 1.15 mmol/L (ref 1.15–1.40)
Chloride: 105 mmol/L (ref 98–111)
Creatinine, Ser: 1.1 mg/dL (ref 0.61–1.24)
Glucose, Bld: 100 mg/dL — ABNORMAL HIGH (ref 70–99)
HCT: 42 % (ref 39.0–52.0)
Hemoglobin: 14.3 g/dL (ref 13.0–17.0)
Potassium: 3.1 mmol/L — ABNORMAL LOW (ref 3.5–5.1)
Sodium: 143 mmol/L (ref 135–145)
TCO2: 27 mmol/L (ref 22–32)

## 2023-05-04 MED ORDER — HYDROCODONE-ACETAMINOPHEN 5-325 MG PO TABS: 1.0000 | ORAL_TABLET | ORAL | 0 refills | Status: DC | PRN

## 2023-05-04 MED ORDER — IOHEXOL 350 MG/ML SOLN
75.0000 mL | Freq: Once | INTRAVENOUS | Status: AC | PRN
  Administered 2023-05-04: 75 mL via INTRAVENOUS

## 2023-05-04 NOTE — ED Triage Notes (Signed)
Pt was seen at UC 2 days ago and has "fluid behind both ears". Pain has spread to right jaw and head. Hurts worse when swallows. Pt able to swallow and maintain secretions. No sob

## 2023-05-04 NOTE — Discharge Instructions (Addendum)
Your labs and CT look ok tonight. The source of your pain has not been identified. Its possible this is from a nerve. You can finish the antibiotics. Follow up with ENT, please call to schedule an appointment. Limited course of Norco for pain, do not take if you are driving.

## 2023-05-04 NOTE — ED Notes (Signed)
Patient verbalizes understanding of discharge instructions. Opportunity for questioning and answers were provided. Pt discharged from ED. 

## 2023-05-04 NOTE — ED Provider Notes (Addendum)
[place] EMERGENCY DEPARTMENT AT [facility] Provider Note   CSN: [id number] Arrival date & time: 05/04/23  0221     History  Chief Complaint  Patient presents with   Facial Pain   Otalgia    HUDIE PICKEN is a 72 y.o. male.  72 year old male with complaint of right-sided facial pain onset 2 days ago, progressively worsening over the past 6 hours.  Went to urgent care, told he had fluid behind his ears and was given with Augmentin.  Denies dental pain, injury, facial swelling.  No problems with swallowing or breathing, is tolerating secretions well.  No other complaints or concerns.       Home Medications Prior to Admission medications   Medication Sig Start Date End Date Taking? Authorizing Provider  HYDROcodone-acetaminophen (NORCO/VICODIN) 5-325 MG tablet Take 1 tablet by mouth every 4 (four) hours as needed. 05/04/23  Yes Jeannie Fend, PA-C  albuterol (VENTOLIN HFA) 108 (90 Base) MCG/ACT inhaler Inhale 2 puffs into the lungs every 6 (six) hours as needed for wheezing or shortness of breath. 02/10/23   Gustavus Bryant, FNP  amLODipine (NORVASC) 5 MG tablet Take 5 mg by mouth daily.    [provider]  amoxicillin-clavulanate (AUGMENTIN) 875-125 MG tablet Take 1 tablet by mouth every 12 (twelve) hours. 05/02/23   Gustavus Bryant, FNP  aspirin EC 81 MG tablet Take 81 mg by mouth daily. Swallow whole.    [provider]  benzonatate (TESSALON) 100 MG capsule Take 1 capsule (100 mg total) by mouth every 8 (eight) hours as needed for cough. 02/10/23   Gustavus Bryant, FNP  cetirizine (ZYRTEC) 10 MG tablet Take 10 mg by mouth daily.    [provider]  cholecalciferol (VITAMIN D3) 25 MCG (1000 UNIT) tablet Take 1,000 Units by mouth daily.    [provider]  fluticasone Aleda Grana) 50 MCG/ACT nasal spray USE 2 SPRAYS IN [facility] NOSTRIL DAILY 05/16/22   Myrlene Broker, MD  gabapentin (NEURONTIN) 300 MG capsule Take 1 capsule (300 mg total)  by mouth 3 (three) times daily. 07/06/22   Rodolph Bong, MD  loratadine (CLARITIN) 10 MG tablet Take 10 mg by mouth daily.    [provider]  losartan (COZAAR) 100 MG tablet TAKE 1 TABLET DAILY 01/17/23   Myrlene Broker, MD  lovastatin (MEVACOR) 20 MG tablet Take 20 mg by mouth at bedtime.    [provider]  meloxicam (MOBIC) 15 MG tablet Take 15 mg by mouth daily.     [provider]  pantoprazole (PROTONIX) 40 MG tablet TAKE 1 TABLET DAILY 08/04/22   Myrlene Broker, MD  tamsulosin (FLOMAX) 0.4 MG CAPS capsule Take 1 capsule (0.4 mg total) by mouth daily. 11/15/20   Geoffery Lyons, MD  terazosin (HYTRIN) 10 MG capsule Take 10 mg by mouth at bedtime.     [provider]  triamcinolone cream (KENALOG) 0.1 % Apply topically twice a day. 08/22/22   Myrlene Broker, MD      Allergies    Lisinopril and Shellfish allergy    Review of Systems   Review of Systems Negative except as per HPI Physical Exam Updated Vital Signs BP (!) 138/90 (BP Location: Right Arm)   Pulse 62   Temp 98 F (36.7 C) (Oral)   Resp 14   Ht 5\' 9"  (1.753 m)   Wt 94.3 kg   SpO2 98%   BMI 30.72 kg/m  Physical Exam Vitals  and nursing note reviewed.  Constitutional:      General: He is not in acute distress.    Appearance: He is well-developed. He is not diaphoretic.  HENT:     Head: Normocephalic and atraumatic.      Comments: Tenderness at right TMJ as well as right submandibular area, no submandibular swelling, no specific tenderness over right parotid gland.  No overlying skin changes.    Right Ear: Tympanic membrane and ear canal normal.     Left Ear: Tympanic membrane and ear canal normal.     Nose: Nose normal. No congestion.     Mouth/Throat:     Mouth: Mucous membranes are moist.     Pharynx: No oropharyngeal exudate or posterior oropharyngeal erythema.  Eyes:     Extraocular Movements: Extraocular movements intact.     Conjunctiva/sclera:  Conjunctivae normal.  Cardiovascular:     Heart sounds: No murmur heard. Pulmonary:     Effort: Pulmonary effort is normal. No respiratory distress.  Abdominal:     General: Bowel sounds are normal. There is no distension.     Palpations: Abdomen is soft.     Tenderness: There is no abdominal tenderness.  Musculoskeletal:     Cervical back: Neck supple. No tenderness.  Skin:    General: Skin is warm and dry.     Capillary Refill: Capillary refill takes less than 2 seconds.     Findings: No rash.  Neurological:     Mental Status: He is alert and oriented to person, place, and time.  Psychiatric:        Behavior: Behavior normal.     ED Results / Procedures / Treatments   Labs (all labs ordered are listed, but only abnormal results are displayed) Labs Reviewed  I-STAT CHEM 8, ED - Abnormal; Notable for the following components:      Result Value   Potassium 3.1 (*)    Glucose, Bld 100 (*)    All other components within normal limits    EKG None  Radiology CT Maxillofacial W Contrast  Result Date: 05/04/2023 CLINICAL DATA:  72 year old male with right jaw and head pain. TMJ pain and limited range of motion. Painful swallowing. Right submandibular area pain. EXAM: CT MAXILLOFACIAL WITH CONTRAST TECHNIQUE: Multidetector CT imaging of the maxillofacial structures was performed with intravenous contrast. Multiplanar CT image reconstructions were also generated. RADIATION DOSE REDUCTION: This exam was performed according to the departmental dose-optimization program which includes automated exposure control, adjustment of the mA and/or kV according to patient size and/or use of iterative reconstruction technique. CONTRAST:  75mL OMNIPAQUE IOHEXOL 350 MG/ML SOLN COMPARISON:  None Available. FINDINGS: Osseous: Mandible intact. Bilateral TMJ normally located, with only mild degenerative osseous changes and perhaps greater on the left. No acute mandible dental finding. Previous mandible  dental extractions. No acute maxillary dental finding. Bilateral maxilla, zygoma, pterygoid, and nasal bones appear intact. Central skull base appears intact. Visible cervical vertebrae appear intact, with degenerative appearing C5-C6 spondylolisthesis associated with chronic left side facet arthropathy. Orbits: Intact orbital walls. Orbits soft tissues appears symmetric and normal. Sinuses: Clear throughout. Bilateral tympanic cavities and mastoids are clear. Unremarkable external auditory canals. Soft tissues: Major vascular structures in the visible neck and at the skull base are enhancing and appear to be patent. Tortuous bilateral cervical ICAs. Left vertebral artery appears dominant. Negative visible thyroid, larynx, pharynx, parapharyngeal spaces, retropharyngeal space. Negative sublingual space. Submandibular glands and submandibular spaces appear symmetric and within normal limits. Bilateral masticator and  parotid spaces appear symmetric and within normal limits. No cervical lymphadenopathy.  No neck mass is identified. Limited intracranial: Negative. IMPRESSION: 1. Negative CT appearance of the Face. No explanation for pain or decreased range of motion. Normal for age mandible/TMJ. Middle ears and paranasal sinuses are clear. 2. Chronic C5-C6 spondylolisthesis with left side facet arthropathy. Electronically Signed   By: Odessa Fleming M.D.   On: 05/04/2023 06:47    Procedures Procedures    Medications Ordered in ED Medications  iohexol (OMNIPAQUE) 350 MG/ML injection 75 mL (75 mLs Intravenous Contrast Given 05/04/23 0610)    ED Course/ Medical Decision Making/ A&P                             Medical Decision Making Amount and/or Complexity of Data Reviewed Radiology: ordered.  Risk Prescription drug management.   This patient presents to the ED for concern of right side facial pain, this involves an extensive number of treatment options, and is a complaint that carries with it a high risk  of complications and morbidity.  The differential diagnosis includes but not limited to parotitis, dental abscess, TMJ, trigeminal neuralgia   Co morbidities that complicate the patient evaluation  GERD, kidney stones, hyperparathyroid, bladder cancer   Additional history obtained:  External records from outside source obtained and reviewed including visit to urgent care dated 05/02/2023, provided with Augmentin   Lab Tests:  I Ordered, and personally interpreted labs.  The pertinent results include: I-STAT chemistry with potassium 3.1, creatinine 1.1.   Imaging Studies ordered:  I ordered imaging studies including CT maxillofacial with contrast I independently visualized and interpreted imaging which showed no abscess/drainable collection or acute source for pain I agree with the radiologist interpretation   Problem List / ED Course / Critical interventions / Medication management  71 year old male with complaint of right-sided facial pain primarily of the TMJ extending to right submandibular area without swelling or overlying skin changes.  Patient has several fillings, no acute dental abscess or other dental changes identified.  He does have pain over the TMJ with movement of the jaw.  I-STAT Chem-8 for creatinine clearance.  CT maxillofacial with contrast without acute source for pain identified.  Consider possible trigeminal neuralgia, referred to ENT, provided with Norco for pain. I have reviewed the patients home medicines and have made adjustments as needed   Social Determinants of Health:  Has dentist and PCP for follow-up, provided with referral to ENT   Test / Admission - Considered:  Stable for discharge follow-up with ENT/PCP.         Final Clinical Impression(s) / ED Diagnoses Final diagnoses:  Facial pain    Rx / DC Orders ED Discharge Orders          Ordered    HYDROcodone-acetaminophen (NORCO/VICODIN) 5-325 MG tablet  Every 4 hours PRN         05/04/23 0704              Jeannie Fend, PA-C 05/04/23 0715    Jeannie Fend, PA-C 05/04/23 1610    Sabas Sous, MD 05/04/23 [phone]

## 2023-05-07 ENCOUNTER — Encounter: Payer: Self-pay | Admitting: Internal Medicine

## 2023-05-07 ENCOUNTER — Ambulatory Visit (INDEPENDENT_AMBULATORY_CARE_PROVIDER_SITE_OTHER): Payer: Medicare Other | Admitting: Internal Medicine

## 2023-05-07 VITALS — BP 160/100 | HR 57 | Temp 98.4°F | Ht 69.0 in | Wt 211.0 lb

## 2023-05-07 DIAGNOSIS — R6884 Jaw pain: Secondary | ICD-10-CM | POA: Diagnosis not present

## 2023-05-07 MED ORDER — CYCLOBENZAPRINE HCL 5 MG PO TABS: 5.0000 mg | ORAL_TABLET | Freq: Three times a day (TID) | ORAL | 1 refills | Status: DC | PRN

## 2023-05-07 NOTE — Assessment & Plan Note (Signed)
Suspect more TMJ than sinusitis but is improving with augmentin treatment. Will advise he finish last 2 days of this. Rx flexeril for pain 5 mg tid prn.

## 2023-05-07 NOTE — Progress Notes (Signed)
   Subjective:   Patient ID: Hector [name], male    DOB: 11-06-1951, 72 y.o.   MRN: [id number]  HPI The patient is a 72 YO man coming in for new jaw pain. Seen at urgent care twice and given augmentin and then CT sinuses fairly clear. Improving some with augmentin 2 days left. Saw dentist this morning and teeth clear.   Review of Systems  Constitutional: Negative.   HENT: Negative.         Jaw pain  Eyes: Negative.   Respiratory:  Negative for cough, chest tightness and shortness of breath.   Cardiovascular:  Negative for chest pain, palpitations and leg swelling.  Gastrointestinal:  Negative for abdominal distention, abdominal pain, constipation, diarrhea, nausea and vomiting.  Musculoskeletal: Negative.   Skin: Negative.   Neurological: Negative.   Psychiatric/Behavioral: Negative.      Objective:  Physical Exam Constitutional:      Appearance: He is well-developed.  HENT:     Head: Normocephalic and atraumatic.     Ears:     Comments: Right TMJ tenderness, right temporal area without tenderness Cardiovascular:     Rate and Rhythm: Normal rate and regular rhythm.  Pulmonary:     Effort: Pulmonary effort is normal. No respiratory distress.     Breath sounds: Normal breath sounds. No wheezing or rales.  Abdominal:     General: Bowel sounds are normal. There is no distension.     Palpations: Abdomen is soft.     Tenderness: There is no abdominal tenderness. There is no rebound.  Musculoskeletal:     Cervical back: Normal range of motion.  Skin:    General: Skin is warm and dry.  Neurological:     Mental Status: He is alert and oriented to person, place, and time.     Coordination: Coordination normal.     Vitals:   05/07/23 1334 05/07/23 1336  BP: (!) 160/100 (!) 160/100  Pulse: (!) 57   Temp: 98.4 F (36.9 C)   TempSrc: Oral   SpO2: 98%   Weight: 211 lb (95.7 kg)   Height: 5\' 9"  (1.753 m)     Assessment & Plan:

## 2023-05-09 ENCOUNTER — Telehealth: Payer: Self-pay

## 2023-05-09 NOTE — Telephone Encounter (Signed)
Transition Care Management Unsuccessful Follow-up Telephone Call  Date of discharge and from where:  05/04/2023 [facility]  Attempts:  1st Attempt  Reason for unsuccessful TCM follow-up call:  Unable to leave message  Hector [name] [facility]  [facility] Population Health Community Resource Care Guide   [email or web address]  ?? [phone]   Website: [email or web address]  [email or web address]

## 2023-05-09 NOTE — Telephone Encounter (Signed)
Transition Care Management Follow-up Telephone Call Date of discharge and from where: 05/04/2023 [facility] How have you been since you were released from the hospital? Patient stated he is feeling better Any questions or concerns? No  Items Reviewed: Did the pt receive and understand the discharge instructions provided? Yes  Medications obtained and verified? Yes  Other? No  Any new allergies since your discharge? No  Dietary orders reviewed? Yes Do you have support at home? Yes   Follow up appointments reviewed:  PCP Hospital f/u appt confirmed? Yes  Scheduled to see Hillard Danker, MD on 05/07/2023 @ [facility]. Specialist Hospital f/u appt confirmed? No  Scheduled to see  on  @ . Are transportation arrangements needed? No  If their condition worsens, is the pt aware to call PCP or go to the Emergency Dept.? Yes Was the patient provided with contact information for the PCP's office or ED? Yes Was to pt encouraged to call back with questions or concerns? Yes  Darin Arndt [facility]  [facility] Population Health Community Resource Care Guide   [email or web address]  ?? [phone]   Website: [email or web address]  [email or web address]

## 2023-07-26 ENCOUNTER — Other Ambulatory Visit: Payer: Self-pay | Admitting: Internal Medicine

## 2023-07-31 ENCOUNTER — Ambulatory Visit: Payer: Medicare Other | Admitting: Internal Medicine

## 2023-08-02 DIAGNOSIS — C678 Malignant neoplasm of overlapping sites of bladder: Secondary | ICD-10-CM | POA: Diagnosis not present

## 2023-08-02 DIAGNOSIS — R3914 Feeling of incomplete bladder emptying: Secondary | ICD-10-CM | POA: Diagnosis not present

## 2023-08-02 DIAGNOSIS — N5201 Erectile dysfunction due to arterial insufficiency: Secondary | ICD-10-CM | POA: Diagnosis not present

## 2023-08-10 ENCOUNTER — Encounter: Payer: Self-pay | Admitting: Pharmacist

## 2023-08-16 ENCOUNTER — Ambulatory Visit (INDEPENDENT_AMBULATORY_CARE_PROVIDER_SITE_OTHER): Payer: Medicare Other | Admitting: Internal Medicine

## 2023-08-16 ENCOUNTER — Encounter: Payer: Self-pay | Admitting: Internal Medicine

## 2023-08-16 VITALS — BP 148/90 | HR 55 | Temp 97.9°F | Ht 69.0 in | Wt 212.0 lb

## 2023-08-16 DIAGNOSIS — I10 Essential (primary) hypertension: Secondary | ICD-10-CM

## 2023-08-16 DIAGNOSIS — E213 Hyperparathyroidism, unspecified: Secondary | ICD-10-CM | POA: Diagnosis not present

## 2023-08-16 DIAGNOSIS — E782 Mixed hyperlipidemia: Secondary | ICD-10-CM | POA: Diagnosis not present

## 2023-08-16 NOTE — Progress Notes (Signed)
Subjective:   Patient ID: Hector [name], male    DOB: 07/12/1951, 72 y.o.   MRN: [id number]  HPI The patient is a 72 YO man coming in for follow up.   Review of Systems  Constitutional: Negative.   HENT: Negative.    Eyes: Negative.   Respiratory:  Negative for cough, chest tightness and shortness of breath.   Cardiovascular:  Negative for chest pain, palpitations and leg swelling.  Gastrointestinal:  Negative for abdominal distention, abdominal pain, constipation, diarrhea, nausea and vomiting.  Musculoskeletal: Negative.   Skin: Negative.   Neurological: Negative.   Psychiatric/Behavioral: Negative.      Objective:  Physical Exam Constitutional:      Appearance: He is well-developed.  HENT:     Head: Normocephalic and atraumatic.  Cardiovascular:     Rate and Rhythm: Normal rate and regular rhythm.  Pulmonary:     Effort: Pulmonary effort is normal. No respiratory distress.     Breath sounds: Normal breath sounds. No wheezing or rales.  Abdominal:     General: Bowel sounds are normal. There is no distension.     Palpations: Abdomen is soft.     Tenderness: There is no abdominal tenderness. There is no rebound.  Musculoskeletal:     Cervical back: Normal range of motion.  Skin:    General: Skin is warm and dry.  Neurological:     Mental Status: He is alert and oriented to person, place, and time.     Coordination: Coordination normal.     Vitals:   08/16/23 0912 08/16/23 0915  BP: (!) 148/90 (!) 148/90  Pulse: (!) 55   Temp: 97.9 F (36.6 C)   TempSrc: Oral   SpO2: 92%   Weight: 212 lb (96.2 kg)   Height: 5\' 9"  (1.753 m)     Assessment & Plan:

## 2023-08-17 NOTE — Assessment & Plan Note (Signed)
Taking lovastatin and [facility] checks labs.

## 2023-08-17 NOTE — Assessment & Plan Note (Signed)
BP mildly elevated but at goal typically and at goal at home. Continue losartan 100 mg daily and amlodipine 5 mg daily.

## 2023-08-17 NOTE — Assessment & Plan Note (Signed)
S/P and recent labs stable.

## 2023-09-26 ENCOUNTER — Ambulatory Visit: Payer: Medicare Other

## 2023-09-26 DIAGNOSIS — Z23 Encounter for immunization: Secondary | ICD-10-CM

## 2023-09-26 DIAGNOSIS — E538 Deficiency of other specified B group vitamins: Secondary | ICD-10-CM

## 2023-09-26 MED ORDER — CYANOCOBALAMIN 1000 MCG/ML IJ SOLN: 1000.0000 ug | Freq: Once | INTRAMUSCULAR | Status: DC

## 2023-09-26 NOTE — Progress Notes (Signed)
Patient here for HD flu shot. Patient tolerated well with no complications.

## 2023-10-06 ENCOUNTER — Other Ambulatory Visit: Payer: Self-pay | Admitting: Urology

## 2023-12-11 ENCOUNTER — Ambulatory Visit: Payer: Self-pay

## 2023-12-12 DIAGNOSIS — M4807 Spinal stenosis, lumbosacral region: Secondary | ICD-10-CM | POA: Diagnosis not present

## 2023-12-12 DIAGNOSIS — M5116 Intervertebral disc disorders with radiculopathy, lumbar region: Secondary | ICD-10-CM | POA: Diagnosis not present

## 2023-12-12 DIAGNOSIS — M4726 Other spondylosis with radiculopathy, lumbar region: Secondary | ICD-10-CM | POA: Diagnosis not present

## 2023-12-12 DIAGNOSIS — M48061 Spinal stenosis, lumbar region without neurogenic claudication: Secondary | ICD-10-CM | POA: Diagnosis not present

## 2023-12-27 ENCOUNTER — Other Ambulatory Visit: Payer: Self-pay | Admitting: Neurological Surgery

## 2024-01-01 DIAGNOSIS — C678 Malignant neoplasm of overlapping sites of bladder: Secondary | ICD-10-CM | POA: Diagnosis not present

## 2024-01-11 ENCOUNTER — Encounter: Payer: Self-pay | Admitting: Internal Medicine

## 2024-01-11 NOTE — Progress Notes (Signed)
 Surgical Instructions   Your procedure is scheduled on Wednesday, February 19th, 2025. Report to [facility] Main Entrance "A" at 7:45 A.M., then check in with the Admitting office. Any questions or running late day of surgery: call [phone]  Questions prior to your surgery date: call [phone], Monday-Friday, 8am-4pm. If you experience any cold or flu symptoms such as cough, fever, chills, shortness of breath, etc. between now and your scheduled surgery, please notify us  at the above number.     Remember:  Do not eat or drink after midnight the night before your surgery    Take these medicines the morning of surgery with A SIP OF WATER : Amlodipine  (Norvasc ) Cetirizine (Zyrtec) or Loratadine (Claritin) Fluticasone  (Flonase ) Pantoprazole  (Protonix ) Tamsulosin  (Flomax )   May take these medicines IF NEEDED: Acetaminophen  (Tylenol )  Please follow  your surgeon's instructions on when to stop your Aspirin .  If no instructions received, please reach out to your surgeon's office.    One week prior to surgery, STOP taking any Aspirin  (unless otherwise instructed by your surgeon) Aleve, Naproxen, Ibuprofen , Motrin , Advil , Goody's, BC's, all herbal medications, fish oil, and non-prescription vitamins.                     Do NOT Smoke (Tobacco/Vaping) for 24 hours prior to your procedure.  If you use a CPAP at night, you may bring your mask/headgear for your overnight stay.   You will be asked to remove any contacts, glasses, piercing's, hearing aid's, dentures/partials prior to surgery. Please bring cases for these items if needed.    Patients discharged the day of surgery will not be allowed to drive home, and someone needs to stay with them for 24 hours.  SURGICAL WAITING ROOM VISITATION Patients may have no more than 2 support people in the waiting area - these visitors may rotate.   Pre-op nurse will coordinate an appropriate time for 1 ADULT support person, who may not  rotate, to accompany patient in pre-op.  Children under the age of 28 must have an adult with them who is not the patient and must remain in the main waiting area with an adult.  If the patient needs to stay at the hospital during part of their recovery, the visitor guidelines for inpatient rooms apply.  Please refer to the [facility] website for the visitor guidelines for any additional information.   If you received a COVID test during your pre-op visit  it is requested that you wear a mask when out in public, stay away from anyone that may not be feeling well and notify your surgeon if you develop symptoms. If you have been in contact with anyone that has tested positive in the last 10 days please notify you surgeon.      Pre-operative 5 CHG Bathing Instructions   You can play a key role in reducing the risk of infection after surgery. Your skin needs to be as free of germs as possible. You can reduce the number of germs on your skin by washing with CHG (chlorhexidine  gluconate) soap before surgery. CHG is an antiseptic soap that kills germs and continues to kill germs even after washing.   DO NOT use if you have an allergy to chlorhexidine /CHG or antibacterial soaps. If your skin becomes reddened or irritated, stop using the CHG and notify one of our RNs at [phone].   Please shower with the CHG soap starting 4 days before surgery using the following schedule:  Please keep in mind the following:  DO NOT shave, including legs and underarms, starting the day of your first shower.   You may shave your face at any point before/day of surgery.  Place clean sheets on your bed the day you start using CHG soap. Use a clean washcloth (not used since being washed) for each shower. DO NOT sleep with pets once you start using the CHG.   CHG Shower Instructions:  Wash your face and private area with normal soap. If you choose to wash your hair, wash first with your normal shampoo.   After you use shampoo/soap, rinse your hair and body thoroughly to remove shampoo/soap residue.  Turn the water  OFF and apply about 3 tablespoons (45 ml) of CHG soap to a CLEAN washcloth.  Apply CHG soap ONLY FROM YOUR NECK DOWN TO YOUR TOES (washing for 3-5 minutes)  DO NOT use CHG soap on face, private areas, open wounds, or sores.  Pay special attention to the area where your surgery is being performed.  If you are having back surgery, having someone wash your back for you may be helpful. Wait 2 minutes after CHG soap is applied, then you may rinse off the CHG soap.  Pat dry with a clean towel  Put on clean clothes/pajamas   If you choose to wear lotion, please use ONLY the CHG-compatible lotions that are listed below.  Additional instructions for the day of surgery: DO NOT APPLY any lotions, deodorants, cologne, or perfumes.   Do not bring valuables to the hospital. [facility] is not responsible for any belongings/valuables. Do not wear nail polish, gel polish, artificial nails, or any other type of covering on natural nails (fingers and toes) Do not wear jewelry or makeup Put on clean/comfortable clothes.  Please brush your teeth.  Ask your nurse before applying any prescription medications to the skin.     CHG Compatible Lotions   Aveeno Moisturizing lotion  Cetaphil Moisturizing Cream  Cetaphil Moisturizing Lotion  Clairol Herbal Essence Moisturizing Lotion, Dry Skin  Clairol Herbal Essence Moisturizing Lotion, Extra Dry Skin  Clairol Herbal Essence Moisturizing Lotion, Normal Skin  Curel Age Defying Therapeutic Moisturizing Lotion with Alpha Hydroxy  Curel Extreme Care Body Lotion  Curel Soothing Hands Moisturizing Hand Lotion  Curel Therapeutic Moisturizing Cream, Fragrance-Free  Curel Therapeutic Moisturizing Lotion, Fragrance-Free  Curel Therapeutic Moisturizing Lotion, Original Formula  Eucerin Daily Replenishing Lotion  Eucerin Dry Skin Therapy Plus Alpha Hydroxy  Crme  Eucerin Dry Skin Therapy Plus Alpha Hydroxy Lotion  Eucerin Original Crme  Eucerin Original Lotion  Eucerin Plus Crme Eucerin Plus Lotion  Eucerin TriLipid Replenishing Lotion  Keri Anti-Bacterial Hand Lotion  Keri Deep Conditioning Original Lotion Dry Skin Formula Softly Scented  Keri Deep Conditioning Original Lotion, Fragrance Free Sensitive Skin Formula  Keri Lotion Fast Absorbing Fragrance Free Sensitive Skin Formula  Keri Lotion Fast Absorbing Softly Scented Dry Skin Formula  Keri Original Lotion  Keri Skin Renewal Lotion Keri Silky Smooth Lotion  Keri Silky Smooth Sensitive Skin Lotion  Nivea Body Creamy Conditioning Oil  Nivea Body Extra Enriched Lotion  Nivea Body Original Lotion  Nivea Body Sheer Moisturizing Lotion Nivea Crme  Nivea Skin Firming Lotion  NutraDerm 30 Skin Lotion  NutraDerm Skin Lotion  NutraDerm Therapeutic Skin Cream  NutraDerm Therapeutic Skin Lotion  ProShield Protective Hand Cream  Provon moisturizing lotion  Please read over the following fact sheets that you were given.

## 2024-01-14 ENCOUNTER — Other Ambulatory Visit: Payer: Self-pay

## 2024-01-14 ENCOUNTER — Encounter (HOSPITAL_COMMUNITY): Payer: Self-pay

## 2024-01-14 ENCOUNTER — Encounter (HOSPITAL_COMMUNITY)
Admission: RE | Admit: 2024-01-14 | Discharge: 2024-01-14 | Disposition: A | Discharge status: Home / Self Care | Payer: Medicare Other | Source: Ambulatory Visit | Attending: Neurological Surgery | Admitting: Neurological Surgery

## 2024-01-14 VITALS — BP 129/85 | HR 63 | Temp 98.0°F | Resp 18 | Ht 69.0 in | Wt 222.0 lb

## 2024-01-14 DIAGNOSIS — R9431 Abnormal electrocardiogram [ECG] [EKG]: Secondary | ICD-10-CM | POA: Diagnosis not present

## 2024-01-14 DIAGNOSIS — Z01818 Encounter for other preprocedural examination: Secondary | ICD-10-CM | POA: Insufficient documentation

## 2024-01-14 DIAGNOSIS — I10 Essential (primary) hypertension: Secondary | ICD-10-CM | POA: Diagnosis not present

## 2024-01-14 LAB — PROTIME-INR
INR: 1 (ref 0.8–1.2)
Prothrombin Time: 13.7 s (ref 11.4–15.2)

## 2024-01-14 LAB — SURGICAL PCR SCREEN
MRSA, PCR: NEGATIVE
Staphylococcus aureus: NEGATIVE

## 2024-01-14 LAB — BASIC METABOLIC PANEL
Anion gap: 10 (ref 5–15)
BUN: 20 mg/dL (ref 8–23)
CO2: 28 mmol/L (ref 22–32)
Calcium: 8.9 mg/dL (ref 8.9–10.3)
Chloride: 105 mmol/L (ref 98–111)
Creatinine, Ser: 1.17 mg/dL (ref 0.61–1.24)
GFR, Estimated: >60 mL/min (ref >60)
Glucose, Bld: 90 mg/dL (ref 70–99)
Potassium: 3.4 mmol/L — ABNORMAL LOW (ref 3.5–5.1)
Sodium: 143 mmol/L (ref 135–145)

## 2024-01-14 LAB — CBC
HCT: 41.4 % (ref 39.0–52.0)
Hemoglobin: 13.5 g/dL (ref 13.0–17.0)
MCH: 28.5 pg (ref 26.0–34.0)
MCHC: 32.6 g/dL (ref 30.0–36.0)
MCV: 87.5 fL (ref 80.0–100.0)
Platelets: 185 10*3/uL (ref 150–400)
RBC: 4.73 MIL/uL (ref 4.22–5.81)
RDW: 14.1 % (ref 11.5–15.5)
WBC: 3.2 10*3/uL — ABNORMAL LOW (ref 4.0–10.5)
nRBC: 0 % (ref 0.0–0.2)

## 2024-01-14 NOTE — Progress Notes (Signed)
   01/14/24 0904  OBSTRUCTIVE SLEEP APNEA  Have you ever been diagnosed with sleep apnea through a sleep study? No  Do you snore loudly (loud enough to be heard through closed doors)?  1  Do you often feel tired, fatigued, or sleepy during the daytime (such as falling asleep during driving or talking to someone)? 0  Has anyone observed you stop breathing during your sleep? 0  Do you have, or are you being treated for high blood pressure? 1  BMI more than 35 kg/m2? 0  Age > 50 (1-yes) 1  Neck circumference greater than:Male 16 inches or larger, Male 17inches or larger? 1  Male Gender (Yes=1) 1  Obstructive Sleep Apnea Score 5  Score 5 or greater  Results sent to PCP

## 2024-01-14 NOTE — Progress Notes (Signed)
 PCP - Dr. Bambi Lever Cardiologist - denies  PPM/ICD - denies   Chest x-ray - denies EKG - 01/14/24 Stress Test - denies ECHO - denies Cardiac Cath - denies  Sleep Study - denies. Sleep apnea score of "5" routed to PCP.    DM- denies  Last dose of GLP1 agonist-  n/a   Blood Thinner Instructions: n/a Aspirin  Instructions: Hold 7 days (per pt). Last dose 2/11.  ERAS Protcol - no, NPO   COVID TEST- n/a   Anesthesia review: no  Patient denies shortness of breath, fever, cough and chest pain at PAT appointment   All instructions explained to the patient, with a verbal understanding of the material. Patient agrees to go over the instructions while at home for a better understanding.  The opportunity to ask questions was provided.

## 2024-01-15 NOTE — Telephone Encounter (Signed)
Placed inside providers office box

## 2024-01-16 ENCOUNTER — Ambulatory Visit: Payer: Medicare Other

## 2024-01-16 VITALS — Ht 69.0 in | Wt 222.0 lb

## 2024-01-16 DIAGNOSIS — Z0000 Encounter for general adult medical examination without abnormal findings: Secondary | ICD-10-CM | POA: Diagnosis not present

## 2024-01-16 NOTE — Telephone Encounter (Signed)
Pt has picked up form

## 2024-01-16 NOTE — Patient Instructions (Addendum)
Mr. Rodwell , Thank you for taking time to come for your Medicare Wellness Visit. I appreciate your ongoing commitment to your health goals. Please review the following plan we discussed and let me know if I can assist you in the future.   Referrals/Orders/Follow-Ups/Clinician Recommendations: Back surgery scheduled for 01/2024 w/Dr Yetta Barre.  Aim for 30 minutes of exercise or brisk walking, 6-8 glasses of water, and 5 servings of fruits and vegetables each day.   This is a list of the screening recommended for you and due dates:  Health Maintenance  Topic Date Due   COVID-19 Vaccine (8 - 2024-25 season) 08/05/2023   Medicare Annual Wellness Visit  01/15/2025   Colon Cancer Screening  03/07/2028   DTaP/Tdap/Td vaccine (8 - Td or Tdap) 06/19/2029   Pneumonia Vaccine  Completed   Flu Shot  Completed   Hepatitis C Screening  Completed   Zoster (Shingles) Vaccine  Completed   HPV Vaccine  Aged Out    Advanced directives: (In Chart) A copy of your advanced directives are scanned into your chart should your provider ever need it.  Next Medicare Annual Wellness Visit scheduled for next year: Yes - 01/16/2025 (Video Visit)

## 2024-01-16 NOTE — Progress Notes (Signed)
Subjective:   Hector [name] is a 73 y.o. male who presents for Medicare Annual/Subsequent preventive examination.  Visit Complete: Virtual I connected with  Hector [name] on 01/16/24 by a video and audio enabled telemedicine application and verified that I am speaking with the correct person using two identifiers.  Patient Location: Home  Provider Location: Office/Clinic  I discussed the limitations of evaluation and management by telemedicine. The patient expressed understanding and agreed to proceed.  Vital Signs: Because this visit was a virtual/telehealth visit, some criteria may be missing or patient reported. Any vitals not documented were not able to be obtained and vitals that have been documented are patient reported.  Patient Medicare AWV questionnaire was completed by the patient on 01/14/24; I have confirmed that all information answered by patient is correct and no changes since this date.  Cardiac Risk Factors include: advanced age (>91men, >37 women);dyslipidemia;hypertension;male gender;obesity (BMI >30kg/m2)     Objective:    Today's Vitals   01/16/24 0759 01/16/24 0800  Weight: 222 lb (100.7 kg)   Height: 5\' 9"  (1.753 m)   PainSc: 9  9    Body mass index is 32.78 kg/m.     01/16/2024    7:56 AM 01/14/2024    9:06 AM 05/04/2023    2:29 AM 12/07/2022    9:02 AM 12/07/2022    8:48 AM 07/24/2022    1:05 PM 12/06/2021    8:46 AM  Advanced Directives  Does Patient Have a Medical Advance Directive? Yes Yes No Yes Yes Yes Yes  Type of Estate agent of [place];Living will Healthcare Power of [place];Living will  Healthcare Power of [place];Living will Healthcare Power of [place];Living will Living will;Healthcare Power of Attorney Living will;Healthcare Power of Attorney  Does patient want to make changes to medical advance directive?  No - Patient declined  No - Patient declined  No - Patient declined No - Patient declined  Copy of  Healthcare Power of Attorney in Chart? Yes - validated most recent copy scanned in chart (See row information) Yes - validated most recent copy scanned in chart (See row information)  Yes - validated most recent copy scanned in chart (See row information) No - copy requested  No - copy requested    Current Medications (verified) Outpatient Encounter Medications as of 01/16/2024  Medication Sig   acetaminophen (TYLENOL) 500 MG tablet Take 1,000 mg by mouth every 6 (six) hours as needed for moderate pain (pain score 4-6).   amLODipine (NORVASC) 5 MG tablet Take 5 mg by mouth daily.   aspirin EC 81 MG tablet Take 81 mg by mouth daily. Swallow whole.   cetirizine (ZYRTEC) 10 MG tablet Take 10 mg by mouth daily.   Cholecalciferol (VITAMIN D) 50 MCG (2000 UT) tablet Take 2,000 Units by mouth daily.   fluticasone (FLONASE) 50 MCG/ACT nasal spray USE 2 SPRAYS IN EACH NOSTRIL DAILY   loratadine (CLARITIN) 10 MG tablet Take 10 mg by mouth daily.   losartan (COZAAR) 100 MG tablet TAKE 1 TABLET DAILY   lovastatin (MEVACOR) 20 MG tablet Take 20 mg by mouth at bedtime.   meloxicam (MOBIC) 15 MG tablet Take 15 mg by mouth daily.    pantoprazole (PROTONIX) 40 MG tablet TAKE 1 TABLET DAILY   sertraline (ZOLOFT) 50 MG tablet Take 50 mg by mouth daily. 1/2 tablet daily - prescribed by the [facility]   tadalafil (CIALIS) 20 MG tablet Take 20 mg by mouth daily as needed  for erectile dysfunction.   tamsulosin (FLOMAX) 0.4 MG CAPS capsule Take 1 capsule (0.4 mg total) by mouth daily.   terazosin (HYTRIN) 10 MG capsule Take 10 mg by mouth at bedtime.    triamcinolone cream (KENALOG) 0.1 % APPLY TOPICALLY TWICE A DAY (Patient taking differently: Apply 1 Application topically 2 (two) times daily as needed (rash).)   No facility-administered encounter medications on file as of 01/16/2024.    Allergies (verified) Lisinopril and Shellfish allergy   History: Past Medical History:  Diagnosis Date   Adhesive  capsulitis 07/31/2018   Allergy    Arthritis    Bladder cancer (HCC)    Chronic kidney disease    kidney stones   GERD (gastroesophageal reflux disease)    History of kidney stones    Hyperlipidemia    Hyperparathyroidism (HCC)    Hypertension    Past Surgical History:  Procedure Laterality Date   COLONOSCOPY     CYSTOSCOPY N/A 09/16/2020   Procedure: CYSTOSCOPY;  Surgeon: Marcine Matar, MD;  Location: [facility];  Service: Urology;  Laterality: N/A;   PARATHYROIDECTOMY Right 09/20/2021   Procedure: RIGHT INFERIOR PARATHYROIDECTOMY;  Surgeon: Darnell Level, MD;  Location: [facility];  Service: General;  Laterality: Right;  77   POLYPECTOMY     PROSTATE SURGERY  12/2012   no cancer   ROTATOR CUFF REPAIR Right 2019   TRANSURETHRAL RESECTION OF BLADDER NECK N/A 09/16/2020   Procedure: TRANSURETHRAL INCISION OF BLADDER NECK CONTRACTURE;  Surgeon: Marcine Matar, MD;  Location: [facility];  Service: Urology;  Laterality: N/A;  45 MINS   TRANSURETHRAL RESECTION OF BLADDER TUMOR WITH MITOMYCIN-C N/A 09/16/2020   Procedure: TRANSURETHRAL RESECTION OF BLADDER TUMOR WITH post op  GEMCITABINE;  Surgeon: Marcine Matar, MD;  Location: [facility];  Service: Urology;  Laterality: N/A;   WISDOM TOOTH EXTRACTION     Family History  Problem Relation Age of Onset   Arthritis Mother    Hyperlipidemia Mother    Heart disease Mother    Stomach cancer Mother    Colon polyps Mother    Arthritis Father    Hyperlipidemia Father    Heart disease Father    Kidney disease Father    Breast cancer Sister    Hypertension Sister    Ovarian cancer Sister    Gallbladder disease Sister        x 6   Colon cancer Neg Hx    Liver cancer Neg Hx    Rectal cancer Neg Hx    Esophageal cancer Neg Hx    Social History   Socioeconomic History   Marital status: Married    Spouse name: Not on file   Number of children: 2   Years of education: 16    Highest education level: Master's degree (e.g., MA, MS, MEng, MEd, MSW, MBA)  Occupational History   Occupation: retired  Tobacco Use   Smoking status: Never   Smokeless tobacco: Never  Vaping Use   Vaping status: Never Used  Substance and Sexual Activity   Alcohol use: No    Alcohol/week: 0.0 standard drinks of alcohol   Drug use: No   Sexual activity: Not on file  Other Topics Concern   Not on file  Social History Narrative   Not on file   Social Drivers of Health   Financial Resource Strain: Low Risk  (01/16/2024)   Overall Financial Resource Strain (CARDIA)    Difficulty of Paying Living Expenses: Not hard  at all  Food Insecurity: No Food Insecurity (01/16/2024)   Hunger Vital Sign    Worried About Running Out of Food in the Last Year: Never true    Ran Out of Food in the Last Year: Never true  Transportation Needs: No Transportation Needs (01/16/2024)   PRAPARE - Administrator, Civil Service (Medical): No    Lack of Transportation (Non-Medical): No  Physical Activity: Insufficiently Active (01/16/2024)   Exercise Vital Sign    Days of Exercise per Week: 3 days    Minutes of Exercise per Session: 40 min  Stress: No Stress Concern Present (01/16/2024)   [employer] of Occupational Health - Occupational Stress Questionnaire    Feeling of Stress : Not at all  Recent Concern: Stress - Stress Concern Present (01/12/2024)   [employer] of Occupational Health - Occupational Stress Questionnaire    Feeling of Stress : To some extent  Social Connections: Socially Integrated (01/16/2024)   Social Connection and Isolation Panel [NHANES]    Frequency of Communication with Friends and Family: More than three times a week    Frequency of Social Gatherings with Friends and Family: Once a week    Attends Religious Services: More than 4 times per year    Active Member of [employer] or Organizations: Yes    Attends Engineer, structural: More than 4 times per year     Marital Status: Married    Tobacco Counseling Counseling given: Not Answered   Clinical Intake:  Pre-visit preparation completed: Yes  Pain : 0-10 Pain Score: 9  Pain Type: Acute pain Pain Location: Back Pain Orientation: Lower Pain Descriptors / Indicators: Aching Pain Onset: 1 to 4 weeks ago Pain Frequency: Intermittent Pain Relieving Factors: taking Tylenol - surgery is scheduled - 01/23/24 Dr Yetta Barre w/[facility]  Pain Relieving Factors: taking Tylenol - surgery is scheduled - 01/23/24 Dr Yetta Barre w/[facility]  BMI - recorded: 32.78 Nutritional Status: BMI > 30  Obese Nutritional Risks: None Diabetes: No  How often do you need to have someone help you when you read instructions, pamphlets, or other written materials from your doctor or pharmacy?: 1 - Never  Interpreter Needed?: No  Information entered by :: Hassell Halim, CMA   Activities of Daily Living    01/16/2024    8:04 AM 01/14/2024    9:08 AM  In your present state of health, do you have any difficulty performing the following activities:  Hearing? 0   Vision? 0   Difficulty concentrating or making decisions? 0   Walking or climbing stairs? 0   Dressing or bathing? 0   Doing errands, shopping? 0 0  Preparing Food and eating ? N   Using the Toilet? N   In the past six months, have you accidently leaked urine? N   Do you have problems with loss of bowel control? N   Managing your Medications? N   Managing your Finances? N   Housekeeping or managing your Housekeeping? N     Patient Care Team: Myrlene Broker, MD as PCP - General (Internal Medicine)  Indicate any recent Medical Services you may have received from other than [facility] providers in the past year (date may be approximate).     Assessment:   This is a routine wellness examination for [facility].  Hearing/Vision screen Hearing Screening - Comments:: Denies hearing difficulties   Vision Screening - Comments:: Wears rx  glasses - up to date with routine eye exams with  the [facility])   Goals Addressed               This Visit's Progress     Patient Stated (pt-stated)        Patient stated he plans to have the back surgery this year and continue to exercise and eat healthy.       Depression Screen    01/16/2024    8:12 AM 08/16/2023    9:15 AM 05/07/2023    1:37 PM 12/13/2022    8:49 AM 12/07/2022    8:51 AM 06/30/2022   10:59 AM 12/06/2021    8:50 AM  PHQ 2/9 Scores  PHQ - 2 Score 0 0 0 0 0 0 0  PHQ- 9 Score   0 0  0     Fall Risk    01/16/2024    8:06 AM 12/06/2023    9:03 AM 08/16/2023    9:15 AM 05/07/2023    1:37 PM 03/15/2023    8:46 AM  Fall Risk   Falls in the past year? 1 1 0 0 0  Number falls in past yr: 1 1 0 0 0  Injury with Fall? 0 0 0 0 0  Risk for fall due to : Impaired balance/gait      Follow up Falls evaluation completed;Falls prevention discussed  Falls evaluation completed Falls evaluation completed Falls evaluation completed    MEDICARE RISK AT HOME: Medicare Risk at Home Any stairs in or around the home?: Yes If so, are there any without handrails?: No Home free of loose throw rugs in walkways, pet beds, electrical cords, etc?: Yes Adequate lighting in your home to reduce risk of falls?: Yes Life alert?: No Use of a cane, walker or w/c?: No Grab bars in the bathroom?: Yes Shower chair or bench in shower?: Yes Elevated toilet seat or a handicapped toilet?: Yes  TIMED UP AND GO:  Was the test performed?  No    Cognitive Function:        01/16/2024    8:07 AM 12/07/2022    8:51 AM  6CIT Screen  What Year? 0 points 0 points  What month? 0 points 0 points  What time? 0 points 0 points  Count back from 20 0 points 0 points  Months in reverse 0 points 0 points  Repeat phrase 0 points 0 points  Total Score 0 points 0 points    Immunizations Immunization History  Administered Date(s) Administered   Fluad Quad(high Dose 65+)  08/21/2019, 10/07/2020, 09/07/2021, 08/16/2022   Fluad Trivalent(High Dose 65+) 09/26/2023   Influenza Split 09/03/2014, 08/04/2016, 09/03/2018   Influenza Whole 11/02/1998, 09/17/2014   Influenza, High Dose Seasonal PF 08/19/2018   Influenza,inj,Quad PF,6+ Mos 09/03/2015, 10/03/2016, 09/13/2017   Influenza-Unspecified 10/02/1997, 01/26/2005, 08/04/2005, 09/03/2005, 08/08/2019, 09/03/2020   Meningococcal polysaccharide vaccine (MPSV4) 03/31/1999   PFIZER Comirnaty(Gray Top)Covid-19 Tri-Sucrose Vaccine 03/12/2021   PFIZER(Purple Top)SARS-COV-2 Vaccination 01/06/2020, 01/29/2020, 08/10/2020, 08/28/2020   PPD Test 01/11/2015   Pfizer Covid-19 Vaccine Bivalent Booster 101yrs & up 08/22/2021   Pfizer(Comirnaty)Fall Seasonal Vaccine 12 years and older 09/06/2022   Pneumococcal Conjugate-13 08/19/2018   Pneumococcal Polysaccharide-23 09/03/2020, 10/07/2020   Td 12/05/1995, 03/14/2007   Td (Adult),unspecified 12/05/1995, 03/14/2007   Tdap 12/04/2008, 06/20/2019   Tetanus 12/04/2005   Typhoid Parenteral 03/31/1999   Yellow Fever 07/24/1997   Zoster Recombinant(Shingrix) 10/02/2017, 12/11/2017, 01/02/2019, 12/12/2019   Zoster, Live 11/06/2014    TDAP status: Up to date - 06/20/2019  Flu  Vaccine status: Up to date - 09/26/23  Pneumococcal vaccine status: Up to date - 10/07/20  Covid-19 vaccine status: Completed vaccines - received at pharmacy per pt  Qualifies for Shingles Vaccine? Yes   Zostavax completed Yes   Shingrix Completed?: Yes  Screening Tests Health Maintenance  Topic Date Due   COVID-19 Vaccine (8 - 2024-25 season) 08/05/2023   Medicare Annual Wellness (AWV)  01/15/2025   Colonoscopy  03/07/2028   DTaP/Tdap/Td (8 - Td or Tdap) 06/19/2029   Pneumonia Vaccine 77+ Years old  Completed   INFLUENZA VACCINE  Completed   Hepatitis C Screening  Completed   Zoster Vaccines- Shingrix  Completed   HPV VACCINES  Aged Out    Health Maintenance  Health Maintenance Due  Topic  Date Due   COVID-19 Vaccine (8 - 2024-25 season) 08/05/2023    Colorectal cancer screening: Type of screening: Colonoscopy. Completed 03/07/2018. Repeat every 10 years  Additional Screening:  Hepatitis C Screening: does qualify; Completed 06/09/2016  Vision Screening: Recommended annual ophthalmology exams for early detection of glaucoma and other disorders of the eye. Is the patient up to date with their annual eye exam?  Yes  Who is the provider or what is the name of the office in which the patient attends annual eye exams? Ophthalmologist at the [facility] in [place], [place] If pt is not established with a provider, would they like to be referred to a provider to establish care? No .   Dental Screening: Recommended annual dental exams for proper oral hygiene  Community Resource Referral / Chronic Care Management: CRR required this visit?  No   CCM required this visit?  No     Plan:     I have personally reviewed and noted the following in the patient's chart:   Medical and social history Use of alcohol, tobacco or illicit drugs  Current medications and supplements including opioid prescriptions. Patient is not currently taking opioid prescriptions. Functional ability and status Nutritional status Physical activity Advanced directives List of other physicians Hospitalizations, surgeries, and ER visits in previous 12 months Vitals Screenings to include cognitive, depression, and falls Referrals and appointments  In addition, I have reviewed and discussed with patient certain preventive protocols, quality metrics, and best practice recommendations. A written personalized care plan for preventive services as well as general preventive health recommendations were provided to patient.     Darreld Mclean, CMA   01/16/2024   After Visit Summary: (MyChart) Due to this being a telephonic visit, the after visit summary with patients personalized plan was offered to patient via  MyChart   Nurse Notes: none

## 2024-01-22 LAB — TYPE AND SCREEN
ABO/RH(D): A POS
Antibody Screen: NEGATIVE

## 2024-01-22 NOTE — Anesthesia Preprocedure Evaluation (Signed)
 Anesthesia Evaluation  Patient identified by MRN, date of birth, ID band Patient awake    Reviewed: Allergy & Precautions, NPO status , Patient's Chart, lab work & pertinent test results, reviewed documented beta blocker date and time   Airway Mallampati: II       Dental no notable dental hx. (+) Teeth Intact, Caps, Dental Advisory Given   Pulmonary neg pulmonary ROS   Pulmonary exam normal breath sounds clear to auscultation       Cardiovascular hypertension, Pt. on medications Normal cardiovascular exam Rhythm:Regular Rate:Normal     Neuro/Psych  Neuromuscular disease  negative psych ROS   GI/Hepatic Neg liver ROS,GERD  Medicated,,  Endo/Other  Hx/o hyperparathyroidism S/P parathyroidectomy  Renal/GU Renal InsufficiencyRenal disease  negative genitourinary   Musculoskeletal  (+) Arthritis , Osteoarthritis,  Spondylolisthesis L4-L%   Abdominal  (+) + obese  Peds  Hematology negative hematology ROS (+)   Anesthesia Other Findings   Reproductive/Obstetrics                              Anesthesia Physical Anesthesia Plan  ASA: 2  Anesthesia Plan: General   Post-op Pain Management: Dilaudid IV, Ofirmev IV (intra-op)* and Precedex   Induction: Intravenous  PONV Risk Score and Plan: 2 and Treatment may vary due to age or medical condition, Ondansetron and Dexamethasone  Airway Management Planned: Oral ETT  Additional Equipment: None  Intra-op Plan:   Post-operative Plan: Extubation in OR  Informed Consent: I have reviewed the patients History and Physical, chart, labs and discussed the procedure including the risks, benefits and alternatives for the proposed anesthesia with the patient or authorized representative who has indicated his/her understanding and acceptance.       Plan Discussed with: CRNA and Anesthesiologist  Anesthesia Plan Comments:         Anesthesia  Quick Evaluation

## 2024-01-23 ENCOUNTER — Ambulatory Visit (HOSPITAL_COMMUNITY): Payer: Self-pay | Admitting: Anesthesiology

## 2024-01-23 ENCOUNTER — Other Ambulatory Visit: Payer: Self-pay

## 2024-01-23 ENCOUNTER — Encounter (HOSPITAL_COMMUNITY)
Admission: RE | Disposition: A | Discharge status: Home / Self Care | Payer: Self-pay | Source: Home / Self Care | Attending: Neurological Surgery

## 2024-01-23 ENCOUNTER — Encounter (HOSPITAL_COMMUNITY): Payer: Self-pay | Admitting: Neurological Surgery

## 2024-01-23 ENCOUNTER — Observation Stay (HOSPITAL_COMMUNITY)
Admission: RE | Admit: 2024-01-23 | Discharge: 2024-01-23 | Disposition: A | Discharge status: Home / Self Care | Payer: Medicare Other | Attending: Neurological Surgery | Admitting: Neurological Surgery

## 2024-01-23 ENCOUNTER — Ambulatory Visit (HOSPITAL_COMMUNITY): Payer: Medicare Other

## 2024-01-23 ENCOUNTER — Other Ambulatory Visit (HOSPITAL_COMMUNITY): Payer: Self-pay

## 2024-01-23 ENCOUNTER — Ambulatory Visit (HOSPITAL_BASED_OUTPATIENT_CLINIC_OR_DEPARTMENT_OTHER): Payer: Self-pay | Admitting: Anesthesiology

## 2024-01-23 DIAGNOSIS — Z79899 Other long term (current) drug therapy: Secondary | ICD-10-CM | POA: Diagnosis not present

## 2024-01-23 DIAGNOSIS — M4316 Spondylolisthesis, lumbar region: Secondary | ICD-10-CM

## 2024-01-23 DIAGNOSIS — Z8551 Personal history of malignant neoplasm of bladder: Secondary | ICD-10-CM | POA: Insufficient documentation

## 2024-01-23 DIAGNOSIS — M48061 Spinal stenosis, lumbar region without neurogenic claudication: Secondary | ICD-10-CM

## 2024-01-23 DIAGNOSIS — M5416 Radiculopathy, lumbar region: Secondary | ICD-10-CM | POA: Insufficient documentation

## 2024-01-23 DIAGNOSIS — M4726 Other spondylosis with radiculopathy, lumbar region: Secondary | ICD-10-CM

## 2024-01-23 DIAGNOSIS — N189 Chronic kidney disease, unspecified: Secondary | ICD-10-CM | POA: Diagnosis not present

## 2024-01-23 DIAGNOSIS — Z981 Arthrodesis status: Principal | ICD-10-CM

## 2024-01-23 DIAGNOSIS — I129 Hypertensive chronic kidney disease with stage 1 through stage 4 chronic kidney disease, or unspecified chronic kidney disease: Secondary | ICD-10-CM | POA: Insufficient documentation

## 2024-01-23 DIAGNOSIS — Z7982 Long term (current) use of aspirin: Secondary | ICD-10-CM | POA: Diagnosis not present

## 2024-01-23 DIAGNOSIS — Z4689 Encounter for fitting and adjustment of other specified devices: Secondary | ICD-10-CM | POA: Diagnosis not present

## 2024-01-23 HISTORY — PX: POSTERIOR FUSION PEDICLE SCREW PLACEMENT: SHX2186

## 2024-01-23 LAB — ABO/RH: ABO/RH(D): A POS

## 2024-01-23 SURGERY — POSTERIOR LUMBAR FUSION 1 LEVEL
Anesthesia: General | Site: Spine Lumbar

## 2024-01-23 MED ORDER — OXYCODONE HCL 5 MG PO TABS: 5.0000 mg | ORAL_TABLET | Freq: Once | ORAL | Status: DC | PRN

## 2024-01-23 MED ORDER — METHOCARBAMOL 500 MG PO TABS
500.0000 mg | ORAL_TABLET | Freq: Four times a day (QID) | ORAL | 1 refills | Status: DC | PRN
  Filled 2024-01-23: qty 60, 15d supply, fill #0

## 2024-01-23 MED ORDER — PROPOFOL 10 MG/ML IV BOLUS
INTRAVENOUS | Status: AC
  Filled 2024-01-23: qty 20

## 2024-01-23 MED ORDER — LIDOCAINE 2% (20 MG/ML) 5 ML SYRINGE
INTRAMUSCULAR | Status: AC
  Filled 2024-01-23: qty 5

## 2024-01-23 MED ORDER — DEXAMETHASONE SODIUM PHOSPHATE 4 MG/ML IJ SOLN: 4.0000 mg | Freq: Four times a day (QID) | INTRAMUSCULAR | Status: DC

## 2024-01-23 MED ORDER — DEXMEDETOMIDINE HCL IN NACL 80 MCG/20ML IV SOLN
INTRAVENOUS | Status: DC | PRN
Start: 2024-01-23 — End: 2024-01-23
  Administered 2024-01-23 (×2): 4 ug via INTRAVENOUS

## 2024-01-23 MED ORDER — METHOCARBAMOL 500 MG PO TABS
500.0000 mg | ORAL_TABLET | Freq: Four times a day (QID) | ORAL | Status: DC | PRN
  Administered 2024-01-23: 500 mg via ORAL
  Filled 2024-01-23: qty 1

## 2024-01-23 MED ORDER — AMLODIPINE BESYLATE 5 MG PO TABS: 5.0000 mg | ORAL_TABLET | Freq: Every day | ORAL | Status: DC

## 2024-01-23 MED ORDER — EPHEDRINE 5 MG/ML INJ
INTRAVENOUS | Status: AC
  Filled 2024-01-23: qty 5

## 2024-01-23 MED ORDER — CHLORHEXIDINE GLUCONATE 0.12 % MT SOLN
15.0000 mL | Freq: Once | OROMUCOSAL | Status: AC
  Administered 2024-01-23: 15 mL via OROMUCOSAL
  Filled 2024-01-23: qty 15

## 2024-01-23 MED ORDER — SENNA 8.6 MG PO TABS
1.0000 | ORAL_TABLET | Freq: Two times a day (BID) | ORAL | Status: DC
  Administered 2024-01-23: 8.6 mg via ORAL
  Filled 2024-01-23: qty 1

## 2024-01-23 MED ORDER — ASPIRIN 81 MG PO TBEC: 81.0000 mg | DELAYED_RELEASE_TABLET | Freq: Every day | ORAL | Status: DC

## 2024-01-23 MED ORDER — THROMBIN 20000 UNITS EX SOLR
CUTANEOUS | Status: AC
  Filled 2024-01-23: qty 20000

## 2024-01-23 MED ORDER — LIDOCAINE 2% (20 MG/ML) 5 ML SYRINGE
INTRAMUSCULAR | Status: DC | PRN
  Administered 2024-01-23: 70 mg via INTRAVENOUS

## 2024-01-23 MED ORDER — VASOPRESSIN 20 UNIT/ML IV SOLN
INTRAVENOUS | Status: AC
  Filled 2024-01-23: qty 1

## 2024-01-23 MED ORDER — PHENOL 1.4 % MT LIQD: 1.0000 | OROMUCOSAL | Status: DC | PRN

## 2024-01-23 MED ORDER — HYDROMORPHONE HCL 1 MG/ML IJ SOLN: 0.5000 mg | INTRAMUSCULAR | Status: DC | PRN

## 2024-01-23 MED ORDER — LOSARTAN POTASSIUM 50 MG PO TABS
100.0000 mg | ORAL_TABLET | Freq: Every day | ORAL | Status: DC
  Administered 2024-01-23: 100 mg via ORAL
  Filled 2024-01-23: qty 2

## 2024-01-23 MED ORDER — CHLORHEXIDINE GLUCONATE CLOTH 2 % EX PADS: 6.0000 | MEDICATED_PAD | Freq: Once | CUTANEOUS | Status: DC

## 2024-01-23 MED ORDER — DEXMEDETOMIDINE HCL IN NACL 80 MCG/20ML IV SOLN
INTRAVENOUS | Status: AC
  Filled 2024-01-23: qty 20

## 2024-01-23 MED ORDER — ACETAMINOPHEN 500 MG PO TABS
1000.0000 mg | ORAL_TABLET | Freq: Four times a day (QID) | ORAL | Status: DC
  Administered 2024-01-23: 1000 mg via ORAL
  Filled 2024-01-23: qty 2

## 2024-01-23 MED ORDER — ROCURONIUM BROMIDE 10 MG/ML (PF) SYRINGE
PREFILLED_SYRINGE | INTRAVENOUS | Status: DC | PRN
  Administered 2024-01-23: 70 mg via INTRAVENOUS

## 2024-01-23 MED ORDER — ONDANSETRON HCL 4 MG/2ML IJ SOLN
INTRAMUSCULAR | Status: AC
  Filled 2024-01-23: qty 2

## 2024-01-23 MED ORDER — EPHEDRINE SULFATE-NACL 50-0.9 MG/10ML-% IV SOSY
PREFILLED_SYRINGE | INTRAVENOUS | Status: DC | PRN
  Administered 2024-01-23: 15 mg via INTRAVENOUS
  Administered 2024-01-23: 10 mg via INTRAVENOUS
  Administered 2024-01-23: 5 mg via INTRAVENOUS
  Administered 2024-01-23 (×3): 10 mg via INTRAVENOUS

## 2024-01-23 MED ORDER — 0.9 % SODIUM CHLORIDE (POUR BTL) OPTIME
TOPICAL | Status: DC | PRN
  Administered 2024-01-23: 1000 mL

## 2024-01-23 MED ORDER — METHOCARBAMOL 1000 MG/10ML IJ SOLN: 500.0000 mg | Freq: Four times a day (QID) | INTRAMUSCULAR | Status: DC | PRN

## 2024-01-23 MED ORDER — SODIUM CHLORIDE 0.9% FLUSH: 3.0000 mL | INTRAVENOUS | Status: DC | PRN

## 2024-01-23 MED ORDER — CELECOXIB 200 MG PO CAPS
200.0000 mg | ORAL_CAPSULE | Freq: Two times a day (BID) | ORAL | Status: DC
Start: 2024-01-23 — End: 2024-01-23
  Administered 2024-01-23: 200 mg via ORAL
  Filled 2024-01-23: qty 1

## 2024-01-23 MED ORDER — SODIUM CHLORIDE 0.9% FLUSH
3.0000 mL | Freq: Two times a day (BID) | INTRAVENOUS | Status: DC
Start: 2024-01-23 — End: 2024-01-23

## 2024-01-23 MED ORDER — HYDROMORPHONE HCL 1 MG/ML IJ SOLN
0.2500 mg | INTRAMUSCULAR | Status: DC | PRN
  Administered 2024-01-23: 0.5 mg via INTRAVENOUS

## 2024-01-23 MED ORDER — DEXAMETHASONE 4 MG PO TABS
4.0000 mg | ORAL_TABLET | Freq: Four times a day (QID) | ORAL | Status: DC
Start: 2024-01-23 — End: 2024-01-23
  Administered 2024-01-23: 4 mg via ORAL
  Filled 2024-01-23: qty 1

## 2024-01-23 MED ORDER — SERTRALINE HCL 50 MG PO TABS
25.0000 mg | ORAL_TABLET | Freq: Every day | ORAL | Status: DC
  Administered 2024-01-23: 25 mg via ORAL
  Filled 2024-01-23: qty 1

## 2024-01-23 MED ORDER — ACETAMINOPHEN 500 MG PO TABS
1000.0000 mg | ORAL_TABLET | ORAL | Status: AC
  Administered 2024-01-23: 1000 mg via ORAL
  Filled 2024-01-23: qty 2

## 2024-01-23 MED ORDER — VASOPRESSIN 20 UNIT/ML IV SOLN
INTRAVENOUS | Status: DC | PRN
  Administered 2024-01-23 (×4): 1 [IU] via INTRAVENOUS

## 2024-01-23 MED ORDER — CEFAZOLIN SODIUM-DEXTROSE 2-4 GM/100ML-% IV SOLN: 2.0000 g | Freq: Three times a day (TID) | INTRAVENOUS | Status: DC

## 2024-01-23 MED ORDER — LACTATED RINGERS IV SOLN: INTRAVENOUS | Status: DC

## 2024-01-23 MED ORDER — DEXAMETHASONE SODIUM PHOSPHATE 10 MG/ML IJ SOLN
INTRAMUSCULAR | Status: DC | PRN
  Administered 2024-01-23: 10 mg via INTRAVENOUS

## 2024-01-23 MED ORDER — SUGAMMADEX SODIUM 200 MG/2ML IV SOLN
INTRAVENOUS | Status: DC | PRN
  Administered 2024-01-23: 200 mg via INTRAVENOUS

## 2024-01-23 MED ORDER — MENTHOL 3 MG MT LOZG: 1.0000 | LOZENGE | OROMUCOSAL | Status: DC | PRN

## 2024-01-23 MED ORDER — DROPERIDOL 2.5 MG/ML IJ SOLN: 0.6250 mg | Freq: Once | INTRAMUSCULAR | Status: DC | PRN

## 2024-01-23 MED ORDER — PROPOFOL 10 MG/ML IV BOLUS
INTRAVENOUS | Status: DC | PRN
Start: 2024-01-23 — End: 2024-01-23
  Administered 2024-01-23: 120 mg via INTRAVENOUS

## 2024-01-23 MED ORDER — ONDANSETRON HCL 4 MG/2ML IJ SOLN: 4.0000 mg | Freq: Four times a day (QID) | INTRAMUSCULAR | Status: DC | PRN

## 2024-01-23 MED ORDER — VASHE WOUND IRRIGATION OPTIME
TOPICAL | Status: DC | PRN
  Administered 2024-01-23: 34 [oz_av] via TOPICAL

## 2024-01-23 MED ORDER — PANTOPRAZOLE SODIUM 40 MG PO TBEC: 40.0000 mg | DELAYED_RELEASE_TABLET | Freq: Every day | ORAL | Status: DC

## 2024-01-23 MED ORDER — FENTANYL CITRATE (PF) 250 MCG/5ML IJ SOLN
INTRAMUSCULAR | Status: DC | PRN
  Administered 2024-01-23: 50 ug via INTRAVENOUS
  Administered 2024-01-23: 100 ug via INTRAVENOUS
  Administered 2024-01-23 (×2): 50 ug via INTRAVENOUS

## 2024-01-23 MED ORDER — TAMSULOSIN HCL 0.4 MG PO CAPS
0.4000 mg | ORAL_CAPSULE | Freq: Every day | ORAL | Status: DC
  Administered 2024-01-23: 0.4 mg via ORAL
  Filled 2024-01-23: qty 1

## 2024-01-23 MED ORDER — ONDANSETRON HCL 4 MG PO TABS: 4.0000 mg | ORAL_TABLET | Freq: Four times a day (QID) | ORAL | Status: DC | PRN

## 2024-01-23 MED ORDER — BUPIVACAINE HCL (PF) 0.25 % IJ SOLN
INTRAMUSCULAR | Status: DC | PRN
  Administered 2024-01-23: 4 mL

## 2024-01-23 MED ORDER — PHENYLEPHRINE 80 MCG/ML (10ML) SYRINGE FOR IV PUSH (FOR BLOOD PRESSURE SUPPORT)
PREFILLED_SYRINGE | INTRAVENOUS | Status: DC | PRN
  Administered 2024-01-23 (×2): 80 ug via INTRAVENOUS
  Administered 2024-01-23: 160 ug via INTRAVENOUS

## 2024-01-23 MED ORDER — THROMBIN 5000 UNITS EX KIT
PACK | CUTANEOUS | Status: AC
  Filled 2024-01-23: qty 1

## 2024-01-23 MED ORDER — PHENYLEPHRINE 80 MCG/ML (10ML) SYRINGE FOR IV PUSH (FOR BLOOD PRESSURE SUPPORT)
PREFILLED_SYRINGE | INTRAVENOUS | Status: AC
  Filled 2024-01-23: qty 10

## 2024-01-23 MED ORDER — ONDANSETRON HCL 4 MG/2ML IJ SOLN: 4.0000 mg | Freq: Once | INTRAMUSCULAR | Status: DC | PRN

## 2024-01-23 MED ORDER — ACETAMINOPHEN 10 MG/ML IV SOLN
INTRAVENOUS | Status: AC
  Filled 2024-01-23: qty 100

## 2024-01-23 MED ORDER — ORAL CARE MOUTH RINSE: 15.0000 mL | Freq: Once | OROMUCOSAL | Status: AC

## 2024-01-23 MED ORDER — OXYCODONE HCL 5 MG PO TABS
5.0000 mg | ORAL_TABLET | ORAL | Status: DC | PRN
  Administered 2024-01-23: 5 mg via ORAL
  Filled 2024-01-23: qty 1

## 2024-01-23 MED ORDER — THROMBIN 20000 UNITS EX SOLR: CUTANEOUS | Status: DC | PRN

## 2024-01-23 MED ORDER — SODIUM CHLORIDE 0.9% FLUSH
3.0000 mL | INTRAVENOUS | Status: DC | PRN
Start: 2024-01-23 — End: 2024-01-23

## 2024-01-23 MED ORDER — PHENYLEPHRINE HCL (PRESSORS) 10 MG/ML IV SOLN
INTRAVENOUS | Status: AC
  Filled 2024-01-23: qty 1

## 2024-01-23 MED ORDER — THROMBIN 5000 UNITS EX SOLR: OROMUCOSAL | Status: DC | PRN

## 2024-01-23 MED ORDER — BUPIVACAINE HCL (PF) 0.25 % IJ SOLN
INTRAMUSCULAR | Status: AC
  Filled 2024-01-23: qty 30

## 2024-01-23 MED ORDER — CEFAZOLIN SODIUM-DEXTROSE 2-4 GM/100ML-% IV SOLN
2.0000 g | INTRAVENOUS | Status: AC
  Administered 2024-01-23: 2 g via INTRAVENOUS
  Filled 2024-01-23: qty 100

## 2024-01-23 MED ORDER — ALBUMIN HUMAN 5 % IV SOLN
INTRAVENOUS | Status: DC | PRN
Start: 2024-01-23 — End: 2024-01-23

## 2024-01-23 MED ORDER — OXYCODONE HCL 5 MG/5ML PO SOLN: 5.0000 mg | Freq: Once | ORAL | Status: DC | PRN

## 2024-01-23 MED ORDER — GABAPENTIN 300 MG PO CAPS
300.0000 mg | ORAL_CAPSULE | ORAL | Status: AC
  Administered 2024-01-23: 300 mg via ORAL
  Filled 2024-01-23: qty 1

## 2024-01-23 MED ORDER — SODIUM CHLORIDE 0.9 % IV SOLN: 250.0000 mL | INTRAVENOUS | Status: DC

## 2024-01-23 MED ORDER — HYDROMORPHONE HCL 1 MG/ML IJ SOLN
INTRAMUSCULAR | Status: AC
  Administered 2024-01-23: 0.5 mg via INTRAVENOUS
  Filled 2024-01-23: qty 1

## 2024-01-23 MED ORDER — OXYCODONE HCL 5 MG PO TABS
5.0000 mg | ORAL_TABLET | ORAL | 0 refills | Status: DC | PRN
  Filled 2024-01-23: qty 40, 7d supply, fill #0

## 2024-01-23 MED ORDER — TERAZOSIN HCL 5 MG PO CAPS
10.0000 mg | ORAL_CAPSULE | Freq: Every day | ORAL | Status: DC
Start: 2024-01-23 — End: 2024-01-23
  Filled 2024-01-23: qty 2

## 2024-01-23 MED ORDER — ROCURONIUM BROMIDE 10 MG/ML (PF) SYRINGE
PREFILLED_SYRINGE | INTRAVENOUS | Status: AC
  Filled 2024-01-23: qty 10

## 2024-01-23 MED ORDER — FENTANYL CITRATE (PF) 250 MCG/5ML IJ SOLN
INTRAMUSCULAR | Status: AC
  Filled 2024-01-23: qty 5

## 2024-01-23 MED ORDER — DEXAMETHASONE SODIUM PHOSPHATE 10 MG/ML IJ SOLN
INTRAMUSCULAR | Status: AC
  Filled 2024-01-23: qty 1

## 2024-01-23 MED ORDER — ONDANSETRON HCL 4 MG/2ML IJ SOLN
INTRAMUSCULAR | Status: DC | PRN
Start: 2024-01-23 — End: 2024-01-23
  Administered 2024-01-23: 4 mg via INTRAVENOUS

## 2024-01-23 SURGICAL SUPPLY — 55 items
ALLOGRAFT BONE FIBER KORE 5 (Bone Implant) IMPLANT
BAG COUNTER SPONGE SURGICOUNT (BAG) ×1 IMPLANT
BASKET BONE COLLECTION (BASKET) ×1 IMPLANT
BENZOIN TINCTURE PRP APPL 2/3 (GAUZE/BANDAGES/DRESSINGS) ×1 IMPLANT
BLADE BONE MILL MEDIUM (MISCELLANEOUS) ×1 IMPLANT
BLADE CLIPPER SURG (BLADE) IMPLANT
BUR CARBIDE MATCH 3.0 (BURR) ×1 IMPLANT
CANISTER SUCT 3000ML PPV (MISCELLANEOUS) ×1 IMPLANT
CLEANSER WND VASHE 34 (WOUND CARE) ×1 IMPLANT
CNTNR URN SCR LID CUP LEK RST (MISCELLANEOUS) ×1 IMPLANT
COVER BACK TABLE 60X90IN (DRAPES) ×1 IMPLANT
DERMABOND ADVANCED .7 DNX12 (GAUZE/BANDAGES/DRESSINGS) ×1 IMPLANT
DRAPE C-ARM 42X72 X-RAY (DRAPES) ×2 IMPLANT
DRAPE C-ARMOR (DRAPES) ×1 IMPLANT
DRAPE LAPAROTOMY 100X72X124 (DRAPES) ×1 IMPLANT
DRAPE SURG 17X23 STRL (DRAPES) ×1 IMPLANT
DRSG OPSITE POSTOP 4X6 (GAUZE/BANDAGES/DRESSINGS) IMPLANT
DURAPREP 26ML APPLICATOR (WOUND CARE) ×1 IMPLANT
ELECT REM PT RETURN 9FT ADLT (ELECTROSURGICAL) ×1 IMPLANT
ELECTRODE REM PT RTRN 9FT ADLT (ELECTROSURGICAL) ×1 IMPLANT
EVACUATOR 1/8 PVC DRAIN (DRAIN) ×1 IMPLANT
GAUZE 4X4 16PLY ~~LOC~~+RFID DBL (SPONGE) IMPLANT
GLOVE BIO SURGEON STRL SZ7 (GLOVE) IMPLANT
GLOVE BIO SURGEON STRL SZ8 (GLOVE) ×2 IMPLANT
GLOVE BIOGEL PI IND STRL 7.0 (GLOVE) IMPLANT
GOWN STRL REUS W/ TWL LRG LVL3 (GOWN DISPOSABLE) IMPLANT
GOWN STRL REUS W/ TWL XL LVL3 (GOWN DISPOSABLE) ×2 IMPLANT
GOWN STRL REUS W/TWL 2XL LVL3 (GOWN DISPOSABLE) IMPLANT
GRAFT BONE PROTEIOS XS 0.5CC (Orthopedic Implant) IMPLANT
HEMOSTAT POWDER KIT SURGIFOAM (HEMOSTASIS) ×1 IMPLANT
KIT BASIN OR (CUSTOM PROCEDURE TRAY) ×1 IMPLANT
KIT TURNOVER KIT B (KITS) ×1 IMPLANT
MILL BONE PREP (MISCELLANEOUS) ×1 IMPLANT
NDL HYPO 25X1 1.5 SAFETY (NEEDLE) ×1 IMPLANT
NEEDLE HYPO 25X1 1.5 SAFETY (NEEDLE) ×1 IMPLANT
NS IRRIG 1000ML POUR BTL (IV SOLUTION) ×1 IMPLANT
PACK LAMINECTOMY NEURO (CUSTOM PROCEDURE TRAY) ×1 IMPLANT
PAD ARMBOARD 7.5X6 YLW CONV (MISCELLANEOUS) ×3 IMPLANT
POWDER MYRIAD MORCLLS FINE 500 (Miscellaneous) IMPLANT
PWDR MYRIAD MORCELLS FINE 500 (Miscellaneous) ×1 IMPLANT
ROD LORD LIPPED TI 5.5X40 (Rod) IMPLANT
SCREW CANC SHANK MOD 6.5X45 (Screw) IMPLANT
SCREW KODIAK 6.5X45 (Screw) IMPLANT
SCREW POLYAXIAL TULIP (Screw) IMPLANT
SET SCREW SPNE (Screw) IMPLANT
SPACER IDENTITI 9X11X25 10D (Spacer) IMPLANT
SPONGE SURGIFOAM ABS GEL 100 (HEMOSTASIS) ×1 IMPLANT
SPONGE T-LAP 4X18 ~~LOC~~+RFID (SPONGE) IMPLANT
STRIP CLOSURE SKIN 1/2X4 (GAUZE/BANDAGES/DRESSINGS) ×1 IMPLANT
SUT VIC AB 0 CT1 18XCR BRD8 (SUTURE) ×1 IMPLANT
SUT VIC AB 2-0 CP2 18 (SUTURE) ×1 IMPLANT
SUT VIC AB 3-0 SH 8-18 (SUTURE) ×2 IMPLANT
TOWEL GREEN STERILE (TOWEL DISPOSABLE) ×1 IMPLANT
TOWEL GREEN STERILE FF (TOWEL DISPOSABLE) ×1 IMPLANT
WATER STERILE IRR 1000ML POUR (IV SOLUTION) ×1 IMPLANT

## 2024-01-23 NOTE — Evaluation (Signed)
 Physical Therapy Brief Evaluation and Discharge Note Patient Details Name: Hector [name] MRN: [id number] DOB: 10-14-1951 Today's Date: 01/23/2024   History of Present Illness  Pt is 73 year old presented to [facility] on  01/23/24 for PLIF L4-5. PMH - adhesive capsulitis, bladder CA, HTN, arthritis  Clinical Impression  Pt doing well with mobility and no further PT needed.  Ready for dc from PT standpoint. Wife present and verbalizes understanding of assist pt needs at home.         PT Assessment Patient does not need any further PT services  Assistance Needed at Discharge  Intermittent Supervision/Assistance    Equipment Recommendations None recommended by PT  Recommendations for Other Services       Precautions/Restrictions Precautions Precautions: Back Precaution Booklet Issued: Yes (comment) Recall of Precautions/Restrictions: Intact Required Braces or Orthoses: Spinal Brace Spinal Brace: Lumbar corset;Applied in sitting position Restrictions Weight Bearing Restrictions Per Provider Order: No        Mobility  Bed Mobility Rolling: Contact guard assist Supine/Sidelying to sit: Min assist   General bed mobility comments: Assist to initiated elevation of trunk into sitting  Transfers Overall transfer level: Needs assistance Equipment used: None Transfers: Sit to/from Stand Sit to Stand: Contact guard assist           General transfer comment: Assist for safety    Ambulation/Gait Ambulation/Gait assistance: Contact guard assist, Supervision Gait Distance (Feet): 300 Feet Assistive device: Rolling walker (2 wheels), None Gait Pattern/deviations: Step-through pattern, Decreased stride length Gait Speed: Below normal General Gait Details: Initial CGA when pt without assistive device and then supervision with walker and supervisionback in room without assistive device  Home Activity Instructions Home Activity Instructions: Amb frequently throughout the  day  Stairs Stairs: Yes Stairs assistance: Contact guard assist Stair Management: One rail Left, Step to pattern Number of Stairs: 3 General stair comments: hand-held + rail. Verbal cues for technique  Modified Rankin (Stroke Patients Only)        Balance Overall balance assessment: Mild deficits observed, not formally tested                        Pertinent Vitals/Pain   Pain Assessment Pain Assessment: 0-10 Pain Score: 4  Pain Location: back Pain Descriptors / Indicators: Sore Pain Intervention(s): Monitored during session, Premedicated before session     Home Living Family/patient expects to be discharged to:: Private residence Living Arrangements: Spouse/significant other Available Help at Discharge: Family;Available 24 hours/day Home Environment: Stairs to enter  [employer] of Steps: 5 Home Equipment: Cane - single point;Rollator (4 wheels);Toilet riser (upright rollator)        Prior Function Level of Independence: Independent      UE/LE Assessment   UE ROM/Strength/Tone/Coordination: WFL    LE ROM/Strength/Tone/Coordination: Impaired LE ROM/Strength/Tone/Coordination Deficits: LLE pt reported some chronic numbness but after beginning to amb pt reported it was gone    Public librarian Communication: No apparent difficulties     Cognition Overall Cognitive Status: Appears within functional limits for tasks assessed/performed       General Comments      Exercises     Assessment/Plan    PT Problem List         PT Visit Diagnosis Other abnormalities of gait and mobility (R26.89);Pain    No Skilled PT Patient will have necessary level of assist by caregiver at discharge   Co-evaluation  AMPAC 6 Clicks Help needed turning from your back to your side while in a flat bed without using bedrails?: A Little Help needed moving from lying on your back to sitting on the side of a flat bed without  using bedrails?: A Little Help needed moving to and from a bed to a chair (including a wheelchair)?: A Little Help needed standing up from a chair using your arms (e.g., wheelchair or bedside chair)?: A Little Help needed to walk in hospital room?: A Little Help needed climbing 3-5 steps with a railing? : A Little 6 Click Score: 18      End of Session Equipment Utilized During Treatment: Gait belt;Back brace Activity Tolerance: Patient tolerated treatment well Patient left: in bed;with call bell/phone within reach;with family/visitor present (sitting EOB) Nurse Communication: Mobility status PT Visit Diagnosis: Other abnormalities of gait and mobility (R26.89);Pain Pain - part of body:  (back)     Time: 4696-2952 PT Time Calculation (min) (ACUTE ONLY): 18 min  Charges:   PT Evaluation $PT Eval Low Complexity: 1 Low      [facility] PT Acute Rehabilitation Services Office [phone]   Angelina Ok [facility]  01/23/2024, 5:05 PM

## 2024-01-23 NOTE — Anesthesia Postprocedure Evaluation (Signed)
 Anesthesia Post Note  Patient: Hector [name]  Procedure(s) Performed: Lumbar Four-Five Posterior Lateral and Interbody fusion (Spine Lumbar)     Patient location during evaluation: PACU Anesthesia Type: General Level of consciousness: awake and alert and oriented Pain management: pain level controlled Vital Signs Assessment: post-procedure vital signs reviewed and stable Respiratory status: spontaneous breathing, nonlabored ventilation and respiratory function stable Cardiovascular status: blood pressure returned to baseline and stable Postop Assessment: no apparent nausea or vomiting Anesthetic complications: no   No notable events documented.  Last Vitals:  Vitals:   01/23/24 1230 01/23/24 1245  BP: 99/62 100/64  Pulse: 63 68  Resp: 17 16  Temp:    SpO2: 96% 96%    Last Pain:  Vitals:   01/23/24 1245  TempSrc:   PainSc: 0-No pain                 Isamu Trammel A.

## 2024-01-23 NOTE — Anesthesia Procedure Notes (Signed)
 Procedure Name: Intubation Date/Time: 01/23/2024 10:02 AM  Performed by: Colbert Coyer, CRNAPre-anesthesia Checklist: Patient identified, Emergency Drugs available, Suction available and Patient being monitored Patient Re-evaluated:Patient Re-evaluated prior to induction Oxygen Delivery Method: Circle System Utilized Preoxygenation: Pre-oxygenation with 100% oxygen Induction Type: IV induction Ventilation: Mask ventilation without difficulty Laryngoscope Size: Mac and 4 Grade View: Grade II Tube type: Oral Tube size: 7.5 mm Number of attempts: 1 Airway Equipment and Method: Stylet Placement Confirmation: ETT inserted through vocal cords under direct vision, positive ETCO2 and breath sounds checked- equal and bilateral Secured at: 22 cm Tube secured with: Tape Dental Injury: Teeth and Oropharynx as per pre-operative assessment

## 2024-01-23 NOTE — Transfer of Care (Signed)
 Immediate Anesthesia Transfer of Care Note  Patient: THUNDER BRIDGEWATER  Procedure(s) Performed: Lumbar Four-Five Posterior Lateral and Interbody fusion (Spine Lumbar)  Patient Location: PACU  Anesthesia Type:General  Level of Consciousness: drowsy and patient cooperative  Airway & Oxygen Therapy: Patient Spontanous Breathing and Patient connected to face mask oxygen  Post-op Assessment: Report given to RN, Post -op Vital signs reviewed and stable, and Patient moving all extremities  Post vital signs: Reviewed and stable  Last Vitals:  Vitals Value Taken Time  BP 106/69 01/23/24 1215  Temp 97.7   Pulse 61 01/23/24 1217  Resp 21 01/23/24 1217  SpO2 95 % 01/23/24 1217  Vitals shown include unfiled device data.  Last Pain:  Vitals:   01/23/24 0750  TempSrc:   PainSc: 7       Patients Stated Pain Goal: 2 (01/23/24 0750)  Complications: No notable events documented.

## 2024-01-23 NOTE — Discharge Summary (Signed)
 Physician Discharge Summary  Patient ID: Hector [name] MRN: [id number] DOB/AGE: 02/26/1951 73 y.o.  Admit date: 01/23/2024 Discharge date: 01/23/2024  Admission Diagnoses: spondylolisthesis L4-5    Discharge Diagnoses: same   Discharged Condition: good  Hospital Course: The patient was admitted on 01/23/2024 and taken to the operating room where the patient underwent PLIF L4-5. The patient tolerated the procedure well and was taken to the recovery room and then to the floor in stable condition. The hospital course was routine. There were no complications. The wound remained clean dry and intact. Pt had appropriate back soreness. No complaints of leg pain or new N/T/W. The patient remained afebrile with stable vital signs, and tolerated a regular diet. The patient continued to increase activities, and pain was well controlled with oral pain medications.   Consults: None  Significant Diagnostic Studies:  Results for orders placed or performed during the hospital encounter of 01/23/24  ABO/Rh   Collection Time: 01/23/24  8:00 AM  Result Value Ref Range   ABO/RH(D)      A POS Performed at [facility], [address] [place], [place] [address]     DG Lumbar Spine 2-3 Views Result Date: 01/23/2024 CLINICAL DATA:  Elective surgery. EXAM: LUMBAR SPINE - 2-3 VIEW COMPARISON:  None Available. FINDINGS: Two fluoroscopic spot views of the lumbar spine submitted from the operating room. Posterior rod with intrapedicular screw fusion and interbody spacer at L4-L5. Fluoroscopy time 1 minutes 12 seconds. Dose 91.27 mGy. IMPRESSION: Procedural fluoroscopy during lumbar surgery. Electronically Signed   By: Narda Rutherford M.D.   On: 01/23/2024 13:21   DG C-Arm 1-60 Min-No Report Result Date: 01/23/2024 Fluoroscopy was utilized by the requesting physician.  No radiographic interpretation.   DG C-Arm 1-60 Min-No Report Result Date: 01/23/2024 Fluoroscopy was utilized by the requesting  physician.  No radiographic interpretation.    Antibiotics:  Anti-infectives (From admission, onward)    Start     Dose/Rate Route Frequency Ordered Stop   01/23/24 1800  ceFAZolin (ANCEF) IVPB 2g/100 mL premix        2 g 200 mL/hr over 30 Minutes Intravenous Every 8 hours 01/23/24 1335 01/24/24 0959   01/23/24 0745  ceFAZolin (ANCEF) IVPB 2g/100 mL premix        2 g 200 mL/hr over 30 Minutes Intravenous On call to O.R. 01/23/24 0737 01/23/24 1021       Discharge Exam: Blood pressure 106/69, pulse 77, temperature 98.2 F (36.8 C), resp. rate 18, height 5\' 9"  (1.753 m), weight 97.5 kg, SpO2 96%. Neurologic: Grossly normal Dressing dry  Discharge Medications:   Allergies as of 01/23/2024       Reactions   Lisinopril Cough   Shellfish Allergy Hives        Medication List     TAKE these medications    acetaminophen 500 MG tablet Commonly known as: TYLENOL Take 1,000 mg by mouth every 6 (six) hours as needed for moderate pain (pain score 4-6).   amLODipine 5 MG tablet Commonly known as: NORVASC Take 5 mg by mouth daily.   aspirin EC 81 MG tablet Take 81 mg by mouth daily. Swallow whole.   cetirizine 10 MG tablet Commonly known as: ZYRTEC Take 10 mg by mouth daily.   fluticasone 50 MCG/ACT nasal spray Commonly known as: FLONASE USE 2 SPRAYS IN EACH NOSTRIL DAILY   loratadine 10 MG tablet Commonly known as: CLARITIN Take 10 mg by mouth daily.   losartan 100 MG  tablet Commonly known as: COZAAR TAKE 1 TABLET DAILY   lovastatin 20 MG tablet Commonly known as: MEVACOR Take 20 mg by mouth at bedtime.   meloxicam 15 MG tablet Commonly known as: MOBIC Take 15 mg by mouth daily.   methocarbamol 500 MG tablet Commonly known as: ROBAXIN Take 1 tablet (500 mg total) by mouth every 6 (six) hours as needed for muscle spasms.   oxyCODONE 5 MG immediate release tablet Commonly known as: Oxy IR/ROXICODONE Take 1 tablet (5 mg total) by mouth every 4 (four) hours  as needed for moderate pain (pain score 4-6).   pantoprazole 40 MG tablet Commonly known as: PROTONIX TAKE 1 TABLET DAILY   sertraline 50 MG tablet Commonly known as: ZOLOFT Take 50 mg by mouth daily. 1/2 tablet daily - prescribed by the [facility]   tadalafil 20 MG tablet Commonly known as: CIALIS Take 20 mg by mouth daily as needed for erectile dysfunction.   tamsulosin 0.4 MG Caps capsule Commonly known as: FLOMAX Take 1 capsule (0.4 mg total) by mouth daily.   terazosin 10 MG capsule Commonly known as: HYTRIN Take 10 mg by mouth at bedtime.   triamcinolone cream 0.1 % Commonly known as: KENALOG APPLY TOPICALLY TWICE A DAY What changed:  how much to take when to take this reasons to take this   Vitamin D 50 MCG (2000 UT) tablet Take 2,000 Units by mouth daily.               Durable Medical Equipment  (From admission, onward)           Start     Ordered   01/23/24 1336  DME Walker rolling  Once       Question:  Patient needs a walker to treat with the following condition  Answer:  S/P lumbar fusion   01/23/24 1335   01/23/24 1336  DME 3 n 1  Once        01/23/24 1335            Disposition: home   Final Dx: PLIF L4-5  Discharge Instructions      Remove dressing in 72 hours   Complete by: As directed    Call MD for:  difficulty breathing, headache or visual disturbances   Complete by: As directed    Call MD for:  persistant nausea and vomiting   Complete by: As directed    Call MD for:  redness, tenderness, or signs of infection (pain, swelling, redness, odor or green/yellow discharge around incision site)   Complete by: As directed    Call MD for:  severe uncontrolled pain   Complete by: As directed    Call MD for:  temperature >100.4   Complete by: As directed    Diet - low sodium heart healthy   Complete by: As directed    Increase activity slowly   Complete by: As directed           Signed: Tia Alert 01/23/2024, 4:04  PM

## 2024-01-23 NOTE — Op Note (Signed)
 01/23/2024  11:55 AM  PATIENT:  Hector [name]  73 y.o. male  PRE-OPERATIVE DIAGNOSIS: Dynamic spondylolisthesis L4-5, spinal stenosis L4-5, facet arthropathy L4-5, back pain with radiculopathy  POST-OPERATIVE DIAGNOSIS:  same  PROCEDURE:   1. Decompressive lumbar laminectomy, hemi facetectomy and foraminotomies L4-5 requiring more work than would be required for a simple exposure of the disk for PLIF in order to adequately decompress the neural elements and address the spinal stenosis 2. Posterior lumbar interbody fusion L4-5 using PTI interbody cages packed with morcellized allograft and autograft  3. Posterior fixation L4-5 using ATEC cortical pedicle screws.  4. Intertransverse arthrodesis L4-5 using morcellized autograft and allograft.  SURGEON:  Marikay Alar, MD  ASSISTANTS: Verlin Dike, FNP  ANESTHESIA:  General  EBL: 300 ml  Total I/O In: 2150 [I.V.:1800; IV Piggyback:350] Out: 300 [Blood:300]  BLOOD ADMINISTERED:none  DRAINS: none   INDICATION FOR PROCEDURE: This patient presented with back pain with leg pain. Imaging revealed hemic spondylolisthesis L4-5. The patient tried a reasonable attempt at conservative medical measures without relief. I recommended decompression and instrumented fusion to address the stenosis as well as the segmental  instability.  Patient understood the risks, benefits, and alternatives and potential outcomes and wished to proceed.  PROCEDURE DETAILS:  The patient was brought to the operating room. After induction of generalized endotracheal anesthesia the patient was rolled into the prone position on chest rolls and all pressure points were padded. The patient's lumbar region was cleaned and then prepped with DuraPrep and draped in the usual sterile fashion. Anesthesia was injected and then a dorsal midline incision was made and carried down to the lumbosacral fascia. The fascia was opened and the paraspinous musculature was taken down in a  subperiosteal fashion to expose L4-5. A self-retaining retractor was placed. Intraoperative fluoroscopy confirmed my level, and I started with placement of the L4 cortical pedicle screws. The pedicle screw entry zones were identified utilizing surface landmarks and  AP and lateral fluoroscopy. I scored the cortex with the high-speed drill and then used the hand drill to drill an upward and outward direction into the pedicle. I then tapped line to line. I then placed a 6.5 x 45 mm cortical pedicle screw into the pedicles of L4 bilaterally.    I then turned my attention to the decompression and complete lumbar laminectomies, hemi- facetectomies, and foraminotomies were performed at L4-5.  My nurse practitioner was directly involved in the decompression and exposure of the neural elements. the patient had significant spinal stenosis and this required more work than would be required for a simple exposure of the disc for posterior lumbar interbody fusion which would only require a limited laminotomy. Much more generous decompression and generous foraminotomy was undertaken in order to adequately decompress the neural elements and address the patient's leg pain. The yellow ligament was removed to expose the underlying dura and nerve roots, and generous foraminotomies were performed to adequately decompress the neural elements. Both the exiting and traversing nerve roots were decompressed on both sides until a coronary dilator passed easily along the nerve roots. Once the decompression was complete, I turned my attention to the posterior lower lumbar interbody fusion. The epidural venous vasculature was coagulated and cut sharply. Disc space was incised and the initial discectomy was performed with pituitary rongeurs. The disc space was distracted with sequential distractors to a height of 11 mm. We then used a series of scrapers and shavers to prepare the endplates for fusion. The midline was prepared with  Epstein  curettes. Once the complete discectomy was finished, we packed an appropriate sized interbody cage with local autograft and morcellized allograft, gently retracted the nerve root, and tapped the cage into position at L4-5.  The midline between the cages was packed with morselized autograft and allograft.   We then turned our attention to the placement of the lower pedicle screws. The pedicle screw entry zones were identified utilizing surface landmarks and fluoroscopy. I drilled into each pedicle utilizing the hand drill, and tapped each pedicle with the appropriate tap. We palpated with a ball probe to assure no break in the cortex. We then placed 6.5 x 45 mm pedicle screws into the pedicles bilaterally at L5.  My nurse practitioner assisted in placement of the pedicle screws.  We then decorticated the transverse processes and laid a mixture of morcellized autograft and allograft out over these to perform intertransverse arthrodesis at L4-5. We then placed lordotic rods into the multiaxial screw heads of the pedicle screws and locked these in position with the locking caps and anti-torque device while achieving compression of her grafts. We then checked our construct with AP and lateral fluoroscopy. Irrigated with copious amounts of Vashe solution. Inspected the nerve roots once again to assure adequate decompression, lined to the dura with Gelfoam,  and then we closed the muscle and the fascia with 0 Vicryl.  We placed 500 g of fine myriad morsels into our 6 cm incision.  We closed the subcutaneous tissues with 2-0 Vicryl and subcuticular tissues with 3-0 Vicryl. The skin was closed with benzoin and Steri-Strips. Dressing was then applied, the patient was awakened from general anesthesia and transported to the recovery room in stable condition. At the end of the procedure all sponge, needle and instrument counts were correct.   PLAN OF CARE: admit to inpatient  PATIENT DISPOSITION:  PACU - hemodynamically  stable.   Delay start of Pharmacological VTE agent (>24hrs) due to surgical blood loss or risk of bleeding:  yes

## 2024-01-23 NOTE — H&P (Signed)
 Subjective: Patient is a 73 y.o. male admitted for spondylolisthesis. Onset of symptoms was several months ago, gradually worsening since that time.  The pain is rated severe, and is located at the across the lower back and radiates to legs. The pain is described as aching and occurs all day. The symptoms have been progressive. Symptoms are exacerbated by exercise, standing, and walking for more than a few minutes. MRI or CT showed spondylolisthesis with stenosis L4-5   Past Medical History:  Diagnosis Date   Adhesive capsulitis 07/31/2018   Allergy    Arthritis    Bladder cancer (HCC)    Chronic kidney disease    kidney stones   GERD (gastroesophageal reflux disease)    History of kidney stones    Hyperlipidemia    Hyperparathyroidism (HCC)    Hypertension     Past Surgical History:  Procedure Laterality Date   COLONOSCOPY     CYSTOSCOPY N/A 09/16/2020   Procedure: CYSTOSCOPY;  Surgeon: Marcine Matar, MD;  Location: [facility];  Service: Urology;  Laterality: N/A;   PARATHYROIDECTOMY Right 09/20/2021   Procedure: RIGHT INFERIOR PARATHYROIDECTOMY;  Surgeon: Darnell Level, MD;  Location: [facility];  Service: General;  Laterality: Right;  77   POLYPECTOMY     PROSTATE SURGERY  12/2012   no cancer   ROTATOR CUFF REPAIR Right 2019   TRANSURETHRAL RESECTION OF BLADDER NECK N/A 09/16/2020   Procedure: TRANSURETHRAL INCISION OF BLADDER NECK CONTRACTURE;  Surgeon: Marcine Matar, MD;  Location: [facility];  Service: Urology;  Laterality: N/A;  45 MINS   TRANSURETHRAL RESECTION OF BLADDER TUMOR WITH MITOMYCIN-C N/A 09/16/2020   Procedure: TRANSURETHRAL RESECTION OF BLADDER TUMOR WITH post op  GEMCITABINE;  Surgeon: Marcine Matar, MD;  Location: [facility];  Service: Urology;  Laterality: N/A;   WISDOM TOOTH EXTRACTION      Prior to Admission medications   Medication Sig Start Date End Date Taking? Authorizing Provider  acetaminophen  (TYLENOL) 500 MG tablet Take 1,000 mg by mouth every 6 (six) hours as needed for moderate pain (pain score 4-6).   Yes [provider]  amLODipine (NORVASC) 5 MG tablet Take 5 mg by mouth daily.   Yes [provider]  aspirin EC 81 MG tablet Take 81 mg by mouth daily. Swallow whole.   Yes [provider]  cetirizine (ZYRTEC) 10 MG tablet Take 10 mg by mouth daily.   Yes [provider]  Cholecalciferol (VITAMIN D) 50 MCG (2000 UT) tablet Take 2,000 Units by mouth daily.   Yes [provider]  fluticasone (FLONASE) 50 MCG/ACT nasal spray USE 2 SPRAYS IN EACH NOSTRIL DAILY 07/26/23  Yes Myrlene Broker, MD  loratadine (CLARITIN) 10 MG tablet Take 10 mg by mouth daily.   Yes [provider]  losartan (COZAAR) 100 MG tablet TAKE 1 TABLET DAILY 01/17/23  Yes Myrlene Broker, MD  lovastatin (MEVACOR) 20 MG tablet Take 20 mg by mouth at bedtime.   Yes [provider]  meloxicam (MOBIC) 15 MG tablet Take 15 mg by mouth daily.    Yes [provider]  pantoprazole (PROTONIX) 40 MG tablet TAKE 1 TABLET DAILY 07/26/23  Yes Myrlene Broker, MD  sertraline (ZOLOFT) 50 MG tablet Take 50 mg by mouth daily. 1/2 tablet daily - prescribed by the [facility]   Yes [provider]  tadalafil (CIALIS) 20 MG tablet Take 20 mg by mouth daily as needed for erectile dysfunction. 01/03/24  Yes [provider]  tamsulosin (FLOMAX) 0.4 MG CAPS capsule Take 1 capsule (0.4 mg total) by mouth daily. 11/15/20  Yes Delo, Riley Lam, MD  terazosin (HYTRIN) 10 MG capsule Take 10 mg by mouth at bedtime.    Yes [provider]  triamcinolone cream (KENALOG) 0.1 % APPLY TOPICALLY TWICE A DAY Patient taking differently: Apply 1 Application topically 2 (two) times daily as needed (rash). 07/26/23  Yes Myrlene Broker, MD   Allergies  Allergen Reactions   Lisinopril Cough   Shellfish Allergy Hives    Social History    Tobacco Use   Smoking status: Never   Smokeless tobacco: Never  Substance Use Topics   Alcohol use: No    Alcohol/week: 0.0 standard drinks of alcohol    Family History  Problem Relation Age of Onset   Arthritis Mother    Hyperlipidemia Mother    Heart disease Mother    Stomach cancer Mother    Colon polyps Mother    Arthritis Father    Hyperlipidemia Father    Heart disease Father    Kidney disease Father    Breast cancer Sister    Hypertension Sister    Ovarian cancer Sister    Gallbladder disease Sister        x 6   Colon cancer Neg Hx    Liver cancer Neg Hx    Rectal cancer Neg Hx    Esophageal cancer Neg Hx      Review of Systems  Positive ROS: neg  All other systems have been reviewed and were otherwise negative with the exception of those mentioned in the HPI and as above.  Objective: Vital signs in last 24 hours: Temp:  [98.2 F (36.8 C)] 98.2 F (36.8 C) (02/19 0717) Pulse Rate:  [83] 83 (02/19 0717) Resp:  [18] 18 (02/19 0717) BP: (149)/(90) 149/90 (02/19 0717) SpO2:  [98 %] 98 % (02/19 0717) Weight:  [97.5 kg] 97.5 kg (02/19 0717)  General Appearance: Alert, cooperative, no distress, appears stated age Head: Normocephalic, without obvious abnormality, atraumatic Eyes: PERRL, conjunctiva/corneas clear, EOM's intact    Neck: Supple, symmetrical, trachea midline Back: Symmetric, no curvature, ROM normal, no CVA tenderness Lungs:  respirations unlabored Heart: Regular rate and rhythm Abdomen: Soft, non-tender Extremities: Extremities normal, atraumatic, no cyanosis or edema Pulses: 2+ and symmetric all extremities Skin: Skin color, texture, turgor normal, no rashes or lesions  NEUROLOGIC:   Mental status: Alert and oriented x4,  no aphasia, good attention span, fund of knowledge, and memory Motor Exam - grossly normal Sensory Exam - grossly normal Reflexes: 1+ Coordination - grossly normal Gait - grossly normal Balance - grossly  normal Cranial Nerves: I: smell Not tested  II: visual acuity  OS: nl    OD: nl  II: visual fields Full to confrontation  II: pupils Equal, round, reactive to light  III,VII: ptosis None  III,IV,VI: extraocular muscles  Full ROM  V: mastication Normal  V: facial light touch sensation  Normal  V,VII: corneal reflex  Present  VII: facial muscle function - upper  Normal  VII: facial muscle function - lower Normal  VIII: hearing Not tested  IX: soft palate elevation  Normal  IX,X: gag reflex Present  XI: trapezius strength  5/5  XI: sternocleidomastoid strength 5/5  XI: neck flexion strength  5/5  XII: tongue strength  Normal    Data Review Lab Results  Component Value Date   WBC 3.2 (L) 01/14/2024   HGB  13.5 01/14/2024   HCT 41.4 01/14/2024   MCV 87.5 01/14/2024   PLT 185 01/14/2024   Lab Results  Component Value Date   NA 143 01/14/2024   K 3.4 (L) 01/14/2024   CL 105 01/14/2024   CO2 28 01/14/2024   BUN 20 01/14/2024   CREATININE 1.17 01/14/2024   GLUCOSE 90 01/14/2024   Lab Results  Component Value Date   INR 1.0 01/14/2024    Assessment/Plan:  Estimated body mass index is 31.75 kg/m as calculated from the following:   Height as of this encounter: 5\' 9"  (1.753 m).   Weight as of this encounter: 97.5 kg. Patient admitted for PLIF L4-5. Patient has failed a reasonable attempt at conservative therapy.  I explained the condition and procedure to the patient and answered any questions.  Patient wishes to proceed with procedure as planned. Understands risks/ benefits and typical outcomes of procedure.   Tia Alert 01/23/2024 9:37 AM

## 2024-01-23 NOTE — Plan of Care (Signed)
 Pt doing well. Pt and wife given D/C instructions with verbal understanding. Rx's were sent to the pharmacy by MD. Pt's incision is clean and dry with no sign of infection. Pt's IV was removed prior to D/C. Pt D/C'd home via wheelchair per MD order. Pt is stable @ D/C and has no other needs at this time. Rema Fendt, RN

## 2024-01-23 NOTE — Progress Notes (Signed)
 OT Cancellation Note  Patient Details Name: Hector [name] MRN: [id number] DOB: [birth year]   Cancelled Treatment:    Reason Eval/Treat Not Completed: OT screened, no needs identified, will sign off. Per PT Ok to screen as pt with family available to assist as needed at home and pt moving well. She went over precautions with pt. OT to sign off. Please re-consult if change in status.    Tyler Deis, OTR/L [facility] Acute Rehabilitation Office: [phone]  [name] 01/23/2024, 5:05 PM

## 2024-01-24 ENCOUNTER — Encounter (HOSPITAL_COMMUNITY): Payer: Self-pay | Admitting: Neurological Surgery

## 2024-01-24 ENCOUNTER — Other Ambulatory Visit: Payer: Self-pay | Admitting: Internal Medicine

## 2024-02-05 ENCOUNTER — Other Ambulatory Visit (HOSPITAL_BASED_OUTPATIENT_CLINIC_OR_DEPARTMENT_OTHER): Payer: Self-pay | Admitting: Neurological Surgery

## 2024-02-05 ENCOUNTER — Ambulatory Visit (HOSPITAL_BASED_OUTPATIENT_CLINIC_OR_DEPARTMENT_OTHER)
Admission: RE | Admit: 2024-02-05 | Discharge: 2024-02-05 | Disposition: A | Discharge status: Home / Self Care | Source: Ambulatory Visit | Attending: Neurological Surgery | Admitting: Neurological Surgery

## 2024-02-05 DIAGNOSIS — M7989 Other specified soft tissue disorders: Secondary | ICD-10-CM | POA: Insufficient documentation

## 2024-02-07 ENCOUNTER — Encounter: Payer: Self-pay | Admitting: Internal Medicine

## 2024-02-07 ENCOUNTER — Other Ambulatory Visit: Payer: Self-pay | Admitting: Internal Medicine

## 2024-02-07 ENCOUNTER — Ambulatory Visit: Admitting: Internal Medicine

## 2024-02-07 VITALS — BP 140/90 | HR 57 | Temp 97.6°F | Ht 69.0 in | Wt 216.2 lb

## 2024-02-07 DIAGNOSIS — M25472 Effusion, left ankle: Secondary | ICD-10-CM | POA: Diagnosis not present

## 2024-02-07 MED ORDER — FUROSEMIDE 20 MG PO TABS
20.0000 mg | ORAL_TABLET | Freq: Every day | ORAL | 0 refills | Status: AC | PRN
Start: 2024-02-07 — End: ?

## 2024-02-07 NOTE — Patient Instructions (Signed)
 Prop up the legs and drink plenty of fluids. Reduce salt intake.   We have sent in lasix to use as needed for fluid in the ankle.

## 2024-02-07 NOTE — Assessment & Plan Note (Signed)
 Suspect swelling post-op. Reviewed [place] and DVT ruled out. Rx lasix 20 mg daily prn for next few weeks. Discussed amlodipine which could cause the swelling and if this persistent more than a few more weeks we can switch this with something else.

## 2024-02-07 NOTE — Progress Notes (Signed)
   Subjective:   Patient ID: Hector [name], male    DOB: [birth year], 73 y.o.   MRN: [id number]  HPI The patient is a 73 YO man coming in for left ankle swelling since back surgery a few weeks ago.   Review of Systems  Constitutional: Negative.   HENT: Negative.    Eyes: Negative.   Respiratory:  Negative for cough, chest tightness and shortness of breath.   Cardiovascular:  Negative for chest pain, palpitations and leg swelling.  Gastrointestinal:  Negative for abdominal distention, abdominal pain, constipation, diarrhea, nausea and vomiting.  Musculoskeletal: Negative.   Skin: Negative.        Ankle swelling  Neurological: Negative.   Psychiatric/Behavioral: Negative.      Objective:  Physical Exam Constitutional:      Appearance: He is well-developed.  HENT:     Head: Normocephalic and atraumatic.  Cardiovascular:     Rate and Rhythm: Normal rate and regular rhythm.  Pulmonary:     Effort: Pulmonary effort is normal. No respiratory distress.     Breath sounds: Normal breath sounds. No wheezing or rales.  Abdominal:     General: Bowel sounds are normal. There is no distension.     Palpations: Abdomen is soft.     Tenderness: There is no abdominal tenderness. There is no rebound.  Musculoskeletal:     Cervical back: Normal range of motion.     Comments: Left ankle swelling 1+ edema  Skin:    General: Skin is warm and dry.  Neurological:     Mental Status: He is alert and oriented to person, place, and time.     Coordination: Coordination normal.     Vitals:   02/07/24 0751  BP: (!) 140/90  Pulse: (!) 57  Temp: 97.6 F (36.4 C)  SpO2: 98%  Weight: 216 lb 3.2 oz (98.1 kg)  Height: 5\' 9"  (1.753 m)    Assessment & Plan:

## 2024-03-12 DIAGNOSIS — M5451 Vertebrogenic low back pain: Secondary | ICD-10-CM | POA: Diagnosis not present

## 2024-03-17 DIAGNOSIS — M5451 Vertebrogenic low back pain: Secondary | ICD-10-CM | POA: Diagnosis not present

## 2024-03-21 DIAGNOSIS — M5451 Vertebrogenic low back pain: Secondary | ICD-10-CM | POA: Diagnosis not present

## 2024-03-28 DIAGNOSIS — M5451 Vertebrogenic low back pain: Secondary | ICD-10-CM | POA: Diagnosis not present

## 2024-04-09 ENCOUNTER — Ambulatory Visit
Admission: RE | Admit: 2024-04-09 | Discharge: 2024-04-09 | Disposition: A | Discharge status: Home / Self Care | Source: Ambulatory Visit

## 2024-04-09 VITALS — BP 135/81 | HR 66 | Temp 98.0°F | Resp 18

## 2024-04-09 DIAGNOSIS — R1013 Epigastric pain: Secondary | ICD-10-CM

## 2024-04-09 DIAGNOSIS — K219 Gastro-esophageal reflux disease without esophagitis: Secondary | ICD-10-CM

## 2024-04-09 MED ORDER — SUCRALFATE 1 G PO TABS: 1.0000 g | ORAL_TABLET | Freq: Three times a day (TID) | ORAL | 0 refills | Status: DC

## 2024-04-09 MED ORDER — FAMOTIDINE 20 MG PO TABS: 20.0000 mg | ORAL_TABLET | Freq: Two times a day (BID) | ORAL | 0 refills | Status: DC

## 2024-04-09 NOTE — ED Provider Notes (Signed)
 [name]    CSN: [id number] Arrival date & time: 04/09/24  0847      History   Chief Complaint Chief Complaint  Patient presents with   Abdominal Pain    Having pain in stomach and.under the ribs. It has been 4 days taking Mylanta and soft diet whenever I could eat. Having some nausea occasionally also. - Entered by patient    HPI Hector [name] is a 73 y.o. male.    Abdominal Pain Patient here for evaluation of epigastric pain x 5 days. Patient has history of GERD. Reports he ate some soup with italian sausage prior to the onset of symptoms and has been attempting to manage symptoms with OTC Mylanta and chronic pantoprazole  relief of Dems.  Patient endorses these had nausea without actually vomiting.  He has not had any changes to his stools.  Endorses poor appetite.  Patient has a history of bladder cancer and kidney disease but reports that bladder cancer is in remission.  Patient is followed by [facility] services for medical care. Patient endorses onset of bodyaches yesterday which have since subsided. No fever or URI symptoms.   Past Medical History:  Diagnosis Date   Adhesive capsulitis 07/31/2018   Allergy    Arthritis    Bladder cancer (HCC)    Chronic kidney disease    kidney stones   GERD (gastroesophageal reflux disease)    History of kidney stones    Hyperlipidemia    Hyperparathyroidism (HCC)    Hypertension     Patient Active Problem List   Diagnosis Date Noted   Left ankle swelling 02/07/2024   S/P lumbar fusion 01/23/2024   Jaw pain 05/07/2023   Left foot pain 06/30/2022   Numbness and tingling of both legs 07/25/2021   Chronic left shoulder pain 11/12/2020   Chronic 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 03/24/2016   Hyperparathyroidism (HCC) 04/13/2015   Vitamin D  deficiency 04/09/2015   Osteoarthritis 11/06/2014   Essential hypertension 11/06/2014   Hyperlipidemia 11/06/2014   GERD (gastroesophageal reflux disease) 11/06/2014    Syncope 11/06/2014   Routine general medical examination at a health care facility 11/06/2014    Past Surgical History:  Procedure Laterality Date   COLONOSCOPY     CYSTOSCOPY N/A 09/16/2020   Procedure: CYSTOSCOPY;  Surgeon: Trent Frizzle, MD;  Location: [facility];  Service: Urology;  Laterality: N/A;   PARATHYROIDECTOMY Right 09/20/2021   Procedure: RIGHT INFERIOR PARATHYROIDECTOMY;  Surgeon: Oralee Billow, MD;  Location: [facility];  Service: General;  Laterality: Right;  77   POLYPECTOMY     POSTERIOR FUSION PEDICLE SCREW PLACEMENT N/A 01/23/2024   Procedure: Lumbar Four-Five Posterior Lateral and Interbody fusion;  Surgeon: Joaquin Mulberry, MD;  [facility];  Service: Neurosurgery;  Laterality: N/A;   PROSTATE SURGERY  12/2012   no cancer   ROTATOR CUFF REPAIR Right 2019   TRANSURETHRAL RESECTION OF BLADDER NECK N/A 09/16/2020   Procedure: TRANSURETHRAL INCISION OF BLADDER NECK CONTRACTURE;  Surgeon: Trent Frizzle, MD;  Location: [facility];  Service: Urology;  Laterality: N/A;  45 MINS   TRANSURETHRAL RESECTION OF BLADDER TUMOR WITH MITOMYCIN -C N/A 09/16/2020   Procedure: TRANSURETHRAL RESECTION OF BLADDER TUMOR WITH post op  GEMCITABINE ;  Surgeon: Trent Frizzle, MD;  Location: [facility];  Service: Urology;  Laterality: N/A;   WISDOM TOOTH EXTRACTION         Home Medications    Prior to Admission medications  Medication Sig Start Date End Date Taking? Authorizing Provider  acetaminophen  (TYLENOL ) 500 MG tablet Take 1,000 mg by mouth every 6 (six) hours as needed for moderate pain (pain score 4-6).   Yes [provider]  amLODipine  (NORVASC ) 5 MG tablet Take 5 mg by mouth daily.   Yes [provider]  aspirin  EC 81 MG tablet Take 81 mg by mouth daily. Swallow whole.   Yes [provider]  cetirizine (ZYRTEC) 10 MG tablet Take 10 mg by mouth daily.   Yes [provider]   Cholecalciferol (VITAMIN D ) 50 MCG (2000 UT) tablet Take 2,000 Units by mouth daily.   Yes [provider]  famotidine (PEPCID) 20 MG tablet Take 1 tablet (20 mg total) by mouth 2 (two) times daily for 5 days. 04/09/24 04/14/24 Yes Buena Carmine, NP  fluticasone  (FLONASE ) 50 MCG/ACT nasal spray USE 2 SPRAYS IN EACH NOSTRIL DAILY 07/26/23  Yes Adelia Homestead, MD  furosemide  (LASIX ) 20 MG tablet Take 1 tablet (20 mg total) by mouth daily as needed for edema. 02/07/24  Yes Adelia Homestead, MD  loratadine (CLARITIN) 10 MG tablet Take 10 mg by mouth daily.   Yes [provider]  losartan  (COZAAR ) 100 MG tablet TAKE 1 TABLET DAILY 01/25/24  Yes Adelia Homestead, MD  lovastatin  (MEVACOR ) 20 MG tablet Take 20 mg by mouth at bedtime.   Yes [provider]  meloxicam (MOBIC) 15 MG tablet Take 15 mg by mouth daily.    Yes [provider]  pantoprazole  (PROTONIX ) 40 MG tablet TAKE 1 TABLET DAILY 07/26/23  Yes Adelia Homestead, MD  sertraline  (ZOLOFT ) 50 MG tablet Take 50 mg by mouth daily. 1/2 tablet daily - prescribed by the [facility]   Yes [provider]  sucralfate (CARAFATE) 1 g tablet Take 1 tablet (1 g total) by mouth 3 (three) times daily with meals. 04/09/24  Yes Buena Carmine, NP  tamsulosin  (FLOMAX ) 0.4 MG CAPS capsule Take 1 capsule (0.4 mg total) by mouth daily. 11/15/20  Yes Delo, Dufm Gibbon, MD  terazosin  (HYTRIN ) 10 MG capsule Take 10 mg by mouth at bedtime.    Yes [provider]  triamcinolone  cream (KENALOG ) 0.1 % APPLY TOPICALLY TWICE A DAY Patient taking differently: Apply 1 Application topically 2 (two) times daily as needed (rash). 07/26/23  Yes Adelia Homestead, MD  amoxicillin -clavulanate (AUGMENTIN ) 500-125 MG tablet Take by mouth. Patient not taking: Reported on 04/09/2024 03/13/24   [provider]  methocarbamol  (ROBAXIN ) 500 MG tablet Take 1 tablet (500 mg total) by mouth every 6 (six) hours as  needed for muscle spasms. Patient not taking: Reported on 04/09/2024 01/23/24   Joaquin Mulberry, MD  methylPREDNISolone  (MEDROL  DOSEPAK) 4 MG TBPK tablet Take by mouth as directed. Patient not taking: Reported on 04/09/2024 03/04/24   [provider]  oxyCODONE  (OXY IR/ROXICODONE ) 5 MG immediate release tablet Take 1 tablet (5 mg total) by mouth every 4 (four) hours as needed for moderate pain (pain score 4-6). 01/23/24   Joaquin Mulberry, MD  tadalafil (CIALIS) 20 MG tablet Take 20 mg by mouth daily as needed for erectile dysfunction. 01/03/24   [provider]    Family History Family History  Problem Relation Age of Onset   Arthritis Mother    Hyperlipidemia Mother    Heart disease Mother    Stomach cancer Mother    Colon polyps Mother    Arthritis Father    Hyperlipidemia  Father    Heart disease Father    Kidney disease Father    Breast cancer Sister    Hypertension Sister    Ovarian cancer Sister    Gallbladder disease Sister        x 6   Colon cancer Neg Hx    Liver cancer Neg Hx    Rectal cancer Neg Hx    Esophageal cancer Neg Hx     Social History Social History   Tobacco Use   Smoking status: Never   Smokeless tobacco: Never  Vaping Use   Vaping status: Never Used  Substance Use Topics   Alcohol use: No    Alcohol/week: 0.0 standard drinks of alcohol   Drug use: No     Allergies   Lisinopril  and Shellfish allergy   Review of Systems Review of Systems  Gastrointestinal:  Positive for abdominal pain.     Physical Exam Triage Vital Signs ED Triage Vitals [04/09/24 0905]  Encounter Vitals Group     BP 135/81     Systolic BP Percentile      Diastolic BP Percentile      Pulse Rate 66     Resp 18     Temp 98 F (36.7 C)     Temp Source Oral     SpO2 97 %     Weight      Height      Head Circumference      Peak Flow      Pain Score 6     Pain Loc      Pain Education      Exclude from Growth Chart    No data found.  Updated  Vital Signs BP 135/81 (BP Location: Left Arm)   Pulse 66   Temp 98 F (36.7 C) (Oral)   Resp 18   SpO2 97%   Visual Acuity Right Eye Distance:   Left Eye Distance:   Bilateral Distance:    Right Eye Near:   Left Eye Near:    Bilateral Near:     Physical Exam Vitals reviewed.  Constitutional:      Appearance: He is well-developed. He is not ill-appearing, toxic-appearing or diaphoretic.  Cardiovascular:     Rate and Rhythm: Normal rate and regular rhythm.  Pulmonary:     Effort: Pulmonary effort is normal.     Breath sounds: Normal breath sounds.  Abdominal:     General: Abdomen is protuberant. Bowel sounds are increased. There is no distension.     Tenderness: There is abdominal tenderness in the epigastric area. There is no right CVA tenderness, left CVA tenderness or rebound.  Skin:    General: Skin is warm and dry.  Neurological:     General: No focal deficit present.     Mental Status: He is alert.      UC Treatments / Results  Labs (all labs ordered are listed, but only abnormal results are displayed) Labs Reviewed - No data to display  EKG   Radiology No results found.  Procedures Procedures (including critical care time)  Medications Ordered in UC Medications - No data to display  Initial Impression / Assessment and Plan / UC Course  I have reviewed the triage vital signs and the nursing notes.  Pertinent labs & imaging results that were available during my care of the patient were reviewed by me and considered in my medical decision making (see chart for details).     Treating for gastric reflux  exacerbation without esophagitis seen epigastric abdominal pain, at length with patient that this is a differential diagnosis is not conclusive given the limitations to diagnostic testing in the urgent care setting.  Positive for symptoms are not improving within 48 hours or worsen prior to 48 hours to go to the emergency department.  Treatment as follows:  famotidine  20 mg bid x 5 days and Carafate with breakfast, lunch and dinner as needed for epigastric pain. HOLD Pantoprazole  for 5 days> If no improvement within 48 hours or if at any point symptoms worsen go to the nearest emergency department for further evaluation and workup of your symptoms.  Your history of GERD I am treating you presumptively for GERD exacerbation however given limitations of urgent care I cannot rule out other causes of your abdominal pain, and would like for you to go to the Emergency department if your symptoms worsen or do not improve within 48 hours.     Final Clinical Impressions(s) / UC Diagnoses   Final diagnoses:  Abdominal pain, epigastric  Gastroesophageal reflux disease without esophagitis     Discharge Instructions      I am treating you for acid reflux exacerbation with famotatine 20 mg bid x 5 days and Carafate with breakfast, lunch and dinner as needed for epigastric pain. HOLD Pantoprazole  for 5 days> If no improvement within 48 hours or if at any point symptoms worsen go to the nearest emergency department for further evaluation and workup of your symptoms.  Your history of GERD I am treating you presumptively for GERD exacerbation however given limitations of urgent care I cannot rule out other causes of your abdominal pain, and would like for you to go to the Emergency department if your symptoms worsen or do not improve within 48 hours.     ED Prescriptions     Medication Sig Dispense Auth. Provider   famotidine (PEPCID) 20 MG tablet Take 1 tablet (20 mg total) by mouth 2 (two) times daily for 5 days. 10 tablet Buena Carmine, NP   sucralfate (CARAFATE) 1 g tablet Take 1 tablet (1 g total) by mouth 3 (three) times daily with meals. 20 tablet Buena Carmine, NP      PDMP not reviewed this encounter.   Buena Carmine, NP 04/09/24 [phone]

## 2024-04-09 NOTE — Discharge Instructions (Addendum)
 I am treating you for acid reflux exacerbation with famotatine 20 mg bid x 5 days and Carafate with breakfast, lunch and dinner as needed for epigastric pain. HOLD Pantoprazole  for 5 days> If no improvement within 48 hours or if at any point symptoms worsen go to the nearest emergency department for further evaluation and workup of your symptoms.  Your history of GERD I am treating you presumptively for GERD exacerbation however given limitations of urgent care I cannot rule out other causes of your abdominal pain, and would like for you to go to the Emergency department if your symptoms worsen or do not improve within 48 hours.

## 2024-04-09 NOTE — ED Triage Notes (Addendum)
 Pt reports epigastric stomach pains x5-6 days. Reports a bloated, achy feeling in the area. Has been treating with Mylanta and soft diet when he has been able to eat. Intermittent nausea, no emesis episodes. Not nauseous currently. Denies diarrhea and having regular Bms. Pt unaware of any factors that may have preceded the abdominal pain. Pt reports he has been in remission of bladder cancer for at least 2 yrs.

## 2024-04-24 ENCOUNTER — Other Ambulatory Visit: Payer: Self-pay | Admitting: Internal Medicine

## 2024-07-16 ENCOUNTER — Other Ambulatory Visit: Payer: Self-pay | Admitting: Internal Medicine

## 2024-08-01 ENCOUNTER — Ambulatory Visit
Admission: RE | Admit: 2024-08-01 | Discharge: 2024-08-01 | Disposition: A | Discharge status: Home / Self Care | Source: Ambulatory Visit | Attending: Family Medicine | Admitting: Family Medicine

## 2024-08-01 VITALS — BP 133/80 | HR 64 | Temp 97.5°F | Resp 17

## 2024-08-01 DIAGNOSIS — L03113 Cellulitis of right upper limb: Secondary | ICD-10-CM | POA: Diagnosis not present

## 2024-08-01 DIAGNOSIS — W57XXXA Bitten or stung by nonvenomous insect and other nonvenomous arthropods, initial encounter: Secondary | ICD-10-CM | POA: Diagnosis not present

## 2024-08-01 DIAGNOSIS — S40861A Insect bite (nonvenomous) of right upper arm, initial encounter: Secondary | ICD-10-CM

## 2024-08-01 MED ORDER — CEPHALEXIN 500 MG PO CAPS: 500.0000 mg | ORAL_CAPSULE | Freq: Three times a day (TID) | ORAL | 0 refills | Status: AC

## 2024-08-01 NOTE — ED Triage Notes (Addendum)
 Pt c/o swelling, pain, and itching of right upper arm for 3 days. States he was out cleaning bushes. Pain  radiates down arm.Pt states today has been better than it was initially

## 2024-08-01 NOTE — Discharge Instructions (Signed)
 You were seen today for an insect bite and possible cellulitis.  I have sent out an oral antibiotic to your pharmacy to cover for this.  Please continue tylenol  for any pain and try to keep the arm elevated if you can to help with swelling.  Please return if not improving.

## 2024-08-01 NOTE — ED Provider Notes (Signed)
 [facility]    CSN: [id number] Arrival date & time: 08/01/24  9183      History   Chief Complaint Chief Complaint  Patient presents with   Insect Bite    Swelling, pain and itch of upper arm for 3 days. - Entered by patient    HPI Hector [name] is a 73 y.o. male.   About 4 days ago he was outside in his bushes.  Wearing long sleeves.  He thinks he was bit by something.  Felt pain at the right upper arm.  It also swelled up.  Lately he has been having pain and swelling down the arm and into the hand.  Overall it is a bit better this morning, but still with hand stiffness.  No fevers/chills.  No n/v.  He has used tylenol .        Past Medical History:  Diagnosis Date   Adhesive capsulitis 07/31/2018   Allergy    Arthritis    Bladder cancer (HCC)    Chronic kidney disease    kidney stones   GERD (gastroesophageal reflux disease)    History of kidney stones    Hyperlipidemia    Hyperparathyroidism (HCC)    Hypertension     Patient Active Problem List   Diagnosis Date Noted   Left ankle swelling 02/07/2024   S/P lumbar fusion 01/23/2024   Jaw pain 05/07/2023   Left foot pain 06/30/2022   Numbness and tingling of both legs 07/25/2021   Chronic left shoulder pain 11/12/2020   Chronic 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 03/24/2016   Hyperparathyroidism (HCC) 04/13/2015   Vitamin D  deficiency 04/09/2015   Osteoarthritis 11/06/2014   Essential hypertension 11/06/2014   Hyperlipidemia 11/06/2014   GERD (gastroesophageal reflux disease) 11/06/2014   Syncope 11/06/2014   Routine general medical examination at a health care facility 11/06/2014    Past Surgical History:  Procedure Laterality Date   COLONOSCOPY     CYSTOSCOPY N/A 09/16/2020   Procedure: CYSTOSCOPY;  Surgeon: Matilda Senior, MD;  Location: [facility];  Service: Urology;  Laterality: N/A;   PARATHYROIDECTOMY Right 09/20/2021   Procedure: RIGHT INFERIOR  PARATHYROIDECTOMY;  Surgeon: Eletha Boas, MD;  Location: [facility];  Service: General;  Laterality: Right;  77   POLYPECTOMY     POSTERIOR FUSION PEDICLE SCREW PLACEMENT N/A 01/23/2024   Procedure: Lumbar Four-Five Posterior Lateral and Interbody fusion;  Surgeon: Joshua Alm Hamilton, MD;  Location: [facility];  Service: Neurosurgery;  Laterality: N/A;   PROSTATE SURGERY  12/2012   no cancer   ROTATOR CUFF REPAIR Right 2019   TRANSURETHRAL RESECTION OF BLADDER NECK N/A 09/16/2020   Procedure: TRANSURETHRAL INCISION OF BLADDER NECK CONTRACTURE;  Surgeon: Matilda Senior, MD;  Location: [facility];  Service: Urology;  Laterality: N/A;  45 MINS   TRANSURETHRAL RESECTION OF BLADDER TUMOR WITH MITOMYCIN -C N/A 09/16/2020   Procedure: TRANSURETHRAL RESECTION OF BLADDER TUMOR WITH post op  GEMCITABINE ;  Surgeon: Matilda Senior, MD;  Location: [facility];  Service: Urology;  Laterality: N/A;   WISDOM TOOTH EXTRACTION         Home Medications    Prior to Admission medications   Medication Sig Start Date End Date Taking? Authorizing Provider  acetaminophen  (TYLENOL ) 500 MG tablet Take 1,000 mg by mouth every 6 (six) hours as needed for moderate pain (pain score 4-6).    [provider]  amLODipine  (NORVASC ) 5 MG tablet Take 5 mg by mouth daily.  [provider]  amoxicillin -clavulanate (AUGMENTIN ) 500-125 MG tablet Take by mouth. Patient not taking: Reported on 04/09/2024 03/13/24   [provider]  aspirin  EC 81 MG tablet Take 81 mg by mouth daily. Swallow whole.    [provider]  cetirizine (ZYRTEC) 10 MG tablet Take 10 mg by mouth daily.    [provider]  Cholecalciferol (VITAMIN D ) 50 MCG (2000 UT) tablet Take 2,000 Units by mouth daily.    [provider]  famotidine  (PEPCID ) 20 MG tablet Take 1 tablet (20 mg total) by mouth 2 (two) times daily for 5 days. 04/09/24 04/14/24  Arloa Suzen RAMAN, NP  fluticasone   (FLONASE ) 50 MCG/ACT nasal spray USE 2 SPRAYS IN EACH NOSTRIL DAILY 07/26/23   Rollene Almarie LABOR, MD  furosemide  (LASIX ) 20 MG tablet Take 1 tablet (20 mg total) by mouth daily as needed for edema. 02/07/24   Rollene Almarie LABOR, MD  loratadine (CLARITIN) 10 MG tablet Take 10 mg by mouth daily.    [provider]  losartan  (COZAAR ) 100 MG tablet TAKE 1 TABLET DAILY 04/24/24   Rollene Almarie LABOR, MD  lovastatin  (MEVACOR ) 20 MG tablet Take 20 mg by mouth at bedtime.    [provider]  meloxicam (MOBIC) 15 MG tablet Take 15 mg by mouth daily.     [provider]  methocarbamol  (ROBAXIN ) 500 MG tablet Take 1 tablet (500 mg total) by mouth every 6 (six) hours as needed for muscle spasms. Patient not taking: Reported on 04/09/2024 01/23/24   Joshua Alm Hamilton, MD  methylPREDNISolone  (MEDROL  DOSEPAK) 4 MG TBPK tablet Take by mouth as directed. Patient not taking: Reported on 04/09/2024 03/04/24   [provider]  oxyCODONE  (OXY IR/ROXICODONE ) 5 MG immediate release tablet Take 1 tablet (5 mg total) by mouth every 4 (four) hours as needed for moderate pain (pain score 4-6). 01/23/24   Joshua Alm Hamilton, MD  pantoprazole  (PROTONIX ) 40 MG tablet TAKE 1 TABLET DAILY 07/18/24   Rollene Almarie LABOR, MD  sertraline  (ZOLOFT ) 50 MG tablet Take 50 mg by mouth daily. 1/2 tablet daily - prescribed by the [facility]    [provider]  sucralfate  (CARAFATE ) 1 g tablet Take 1 tablet (1 g total) by mouth 3 (three) times daily with meals. 04/09/24   Arloa Suzen RAMAN, NP  tadalafil (CIALIS) 20 MG tablet Take 20 mg by mouth daily as needed for erectile dysfunction. 01/03/24   [provider]  tamsulosin  (FLOMAX ) 0.4 MG CAPS capsule Take 1 capsule (0.4 mg total) by mouth daily. 11/15/20   Geroldine Berg, MD  terazosin  (HYTRIN ) 10 MG capsule Take 10 mg by mouth at bedtime.     [provider]  triamcinolone  cream (KENALOG ) 0.1 % APPLY TOPICALLY TWICE A  DAY Patient taking differently: Apply 1 Application topically 2 (two) times daily as needed (rash). 07/26/23   Rollene Almarie LABOR, MD    Family History Family History  Problem Relation Age of Onset   Arthritis Mother    Hyperlipidemia Mother    Heart disease Mother    Stomach cancer Mother    Colon polyps Mother    Arthritis Father    Hyperlipidemia Father    Heart disease Father    Kidney disease Father    Breast cancer Sister    Hypertension Sister    Ovarian cancer Sister    Gallbladder disease Sister        x 6   Colon cancer Neg Hx  Liver cancer Neg Hx    Rectal cancer Neg Hx    Esophageal cancer Neg Hx     Social History Social History   Tobacco Use   Smoking status: Never   Smokeless tobacco: Never  Vaping Use   Vaping status: Never Used  Substance Use Topics   Alcohol use: No    Alcohol/week: 0.0 standard drinks of alcohol   Drug use: No     Allergies   Lisinopril  and Shellfish allergy   Review of Systems Review of Systems  Constitutional: Negative.   HENT: Negative.    Respiratory: Negative.    Cardiovascular: Negative.   Gastrointestinal: Negative.   Skin:  Positive for color change.     Physical Exam Triage Vital Signs ED Triage Vitals  Encounter Vitals Group     BP 08/01/24 0833 133/80     Girls Systolic BP Percentile --      Girls Diastolic BP Percentile --      Boys Systolic BP Percentile --      Boys Diastolic BP Percentile --      Pulse Rate 08/01/24 0833 64     Resp 08/01/24 0833 17     Temp 08/01/24 0833 (!) 97.5 F (36.4 C)     Temp Source 08/01/24 0833 Oral     SpO2 08/01/24 0833 96 %     Weight --      Height --      Head Circumference --      Peak Flow --      Pain Score 08/01/24 0836 6     Pain Loc --      Pain Education --      Exclude from Growth Chart --    No data found.  Updated Vital Signs BP 133/80 (BP Location: Right Arm)   Pulse 64   Temp (!) 97.5 F (36.4 C) (Oral)   Resp 17   SpO2 96%    Visual Acuity Right Eye Distance:   Left Eye Distance:   Bilateral Distance:    Right Eye Near:   Left Eye Near:    Bilateral Near:     Physical Exam Constitutional:      Appearance: Normal appearance. He is normal weight.  Skin:    Comments: At the back of the right upper arm is an area of tenderness, redness and warmth;  there is a small sting/bite present;  there is slight tenderness down the arm, without obvious warmth or swelling.  Normal hand grip strength  Neurological:     General: No focal deficit present.     Mental Status: He is alert.  Psychiatric:        Mood and Affect: Mood normal.      UC Treatments / Results  Labs (all labs ordered are listed, but only abnormal results are displayed) Labs Reviewed - No data to display  EKG   Radiology No results found.  Procedures Procedures (including critical care time)  Medications Ordered in UC Medications - No data to display  Initial Impression / Assessment and Plan / UC Course  I have reviewed the triage vital signs and the nursing notes.  Pertinent labs & imaging results that were available during my care of the patient were reviewed by me and considered in my medical decision making (see chart for details).   Final Clinical Impressions(s) / UC Diagnoses   Final diagnoses:  Insect bite of right upper arm, initial encounter  Cellulitis of right upper extremity  Discharge Instructions      You were seen today for an insect bite and possible cellulitis.  I have sent out an oral antibiotic to your pharmacy to cover for this.  Please continue tylenol  for any pain and try to keep the arm elevated if you can to help with swelling.  Please return if not improving.       PDMP not reviewed this encounter.   Darral Longs, MD 08/01/24 [phone]

## 2024-08-05 ENCOUNTER — Encounter: Payer: Self-pay | Admitting: Internal Medicine

## 2024-08-05 ENCOUNTER — Ambulatory Visit (INDEPENDENT_AMBULATORY_CARE_PROVIDER_SITE_OTHER): Admitting: Internal Medicine

## 2024-08-05 VITALS — BP 140/100 | HR 68 | Temp 97.7°F | Ht 69.0 in | Wt 224.0 lb

## 2024-08-05 DIAGNOSIS — C678 Malignant neoplasm of overlapping sites of bladder: Secondary | ICD-10-CM | POA: Diagnosis not present

## 2024-08-05 DIAGNOSIS — R8290 Unspecified abnormal findings in urine: Secondary | ICD-10-CM | POA: Diagnosis not present

## 2024-08-05 DIAGNOSIS — R3914 Feeling of incomplete bladder emptying: Secondary | ICD-10-CM | POA: Diagnosis not present

## 2024-08-05 DIAGNOSIS — I10 Essential (primary) hypertension: Secondary | ICD-10-CM | POA: Diagnosis not present

## 2024-08-05 DIAGNOSIS — N5201 Erectile dysfunction due to arterial insufficiency: Secondary | ICD-10-CM | POA: Diagnosis not present

## 2024-08-05 DIAGNOSIS — J0110 Acute frontal sinusitis, unspecified: Secondary | ICD-10-CM

## 2024-08-05 LAB — POCT URINALYSIS DIPSTICK
Bilirubin, UA: NEGATIVE
Blood, UA: NEGATIVE
Glucose, UA: NEGATIVE
Ketones, UA: NEGATIVE
Leukocytes, UA: NEGATIVE
Nitrite, UA: NEGATIVE
Protein, UA: NEGATIVE
Spec Grav, UA: 1.015 (ref 1.010–1.025)
Urobilinogen, UA: NEGATIVE U/dL — AB
pH, UA: 6 (ref 5.0–8.0)

## 2024-08-05 NOTE — Progress Notes (Unsigned)
 Subjective:   Patient ID: Hector [name], male    DOB: 14-Feb-1951, 73 y.o.   MRN: [id number]  Discussed the use of AI scribe software for clinical note transcription with the patient, who gave verbal consent to proceed.  History of Present Illness Hector [name] is a 73 year old male who presents with a persistent cough and sinus issues.  He has experienced a raspy cough and voice for about three weeks. Over-the-counter cough medicine and inhalers provide some relief. The cough is sometimes accompanied by a sensation of choking when lying down, although he slept well the previous night.  He describes a low-grade headache that has been present for the same duration, initially worse but now slightly improving. The headache is associated with sinus pressure, particularly around the ears. No fever, chills, or significant ear pain. He has been using Claritin and Flonase  regularly.  He experiences shortness of breath, especially during physical activity, and continues to try to work out despite having hip and back issues. No swelling, but ongoing pain affects his walking.  He notes a change in the smell of his urine and some stiffness around the left kidney area.  He has been taking amlodipine  for blood pressure management, with recent readings showing improvement after reducing caffeine intake. Home blood pressure readings are in the 120s/70s to 80s range.  He started taking Keflex  (cefalexin) on Saturday for an unrelated issue, which may also help with his sinus symptoms.   Review of Systems  Constitutional:  Positive for activity change, appetite change and chills. Negative for fatigue, fever and unexpected weight change.  HENT:  Positive for congestion, postnasal drip, rhinorrhea and sinus pressure. Negative for ear discharge, ear pain, sinus pain, sneezing, sore throat, tinnitus, trouble swallowing and voice change.   Eyes: Negative.   Respiratory:  Positive for cough. Negative for  chest tightness, shortness of breath and wheezing.   Cardiovascular: Negative.   Gastrointestinal: Negative.   Genitourinary:        Odor urine  Musculoskeletal:  Positive for myalgias.  Neurological:  Positive for headaches.    Objective:  Physical Exam Constitutional:      Appearance: He is well-developed.  HENT:     Head: Normocephalic and atraumatic.     Comments: Oropharynx with redness and clear drainage, nose with swollen turbinates, TMs normal bilaterally.  Neck:     Thyroid : No thyromegaly.  Cardiovascular:     Rate and Rhythm: Normal rate and regular rhythm.  Pulmonary:     Effort: Pulmonary effort is normal. No respiratory distress.     Breath sounds: Normal breath sounds. No wheezing or rales.  Abdominal:     Palpations: Abdomen is soft.  Musculoskeletal:        General: Tenderness present.     Cervical back: Normal range of motion.  Lymphadenopathy:     Cervical: No cervical adenopathy.  Skin:    General: Skin is warm and dry.  Neurological:     Mental Status: He is alert and oriented to person, place, and time.     Vitals:   08/05/24 0857  BP: (!) 140/100  Pulse: 68  Temp: 97.7 F (36.5 C)  TempSrc: Oral  SpO2: 99%  Weight: 224 lb (101.6 kg)  Height: 5' 9 (1.753 m)    Assessment and Plan Assessment & Plan Acute sinusitis with cough   Raspy cough, nasal congestion, and headache have persisted for three weeks, but improvement is noted with Cefalexin. Continue Cefalexin as prescribed.  Maintain use of over-the-counter cough medicine, Claritin, and Flonase .  Hypertension   Blood pressure has improved to 120s/70s-80s with reduced caffeine intake. No increase in amlodipine  is necessary. Maintain the current dose of amlodipine , continue avoiding caffeine, and monitor blood pressure.  Abnormal urine odor   There is an abnormal urine odor with no signs of infection. Reduce intake of coffee and sweet tea and continue to avoid them. POC U/A done and no  infection.

## 2024-08-07 NOTE — Assessment & Plan Note (Signed)
 Blood pressure has improved to 120s/70s-80s with reduced caffeine intake. No increase in amlodipine  is necessary. Maintain the current dose of amlodipine , continue avoiding caffeine, and monitor blood pressure.

## 2024-08-17 ENCOUNTER — Other Ambulatory Visit: Payer: Self-pay | Admitting: Internal Medicine

## 2024-10-08 ENCOUNTER — Other Ambulatory Visit: Payer: Self-pay | Admitting: Internal Medicine

## 2024-10-15 ENCOUNTER — Encounter: Payer: Self-pay | Admitting: Podiatry

## 2024-10-15 ENCOUNTER — Ambulatory Visit: Admitting: Podiatry

## 2024-10-15 ENCOUNTER — Ambulatory Visit (INDEPENDENT_AMBULATORY_CARE_PROVIDER_SITE_OTHER): Admitting: Podiatry

## 2024-10-15 VITALS — Ht 69.0 in | Wt 224.0 lb

## 2024-10-15 DIAGNOSIS — L600 Ingrowing nail: Secondary | ICD-10-CM

## 2024-10-15 NOTE — Progress Notes (Signed)
 Chief Complaint  Patient presents with   Toe Pain    Pt is here due to right great toe pain, thinks it could be an ingrown.    Subjective: Patient presents today for evaluation of pain to the right great toe. Patient is concerned for possible ingrown nail.  It is very sensitive to touch.  Patient presents today for further treatment and evaluation.  Past Medical History:  Diagnosis Date   Adhesive capsulitis 07/31/2018   Allergy    Arthritis    Bladder cancer (HCC)    Chronic kidney disease    kidney stones   GERD (gastroesophageal reflux disease)    History of kidney stones    Hyperlipidemia    Hyperparathyroidism    Hypertension     Past Surgical History:  Procedure Laterality Date   COLONOSCOPY     CYSTOSCOPY N/A 09/16/2020   Procedure: CYSTOSCOPY;  Surgeon: Matilda Senior, MD;  Location: [facility];  Service: Urology;  Laterality: N/A;   PARATHYROIDECTOMY Right 09/20/2021   Procedure: RIGHT INFERIOR PARATHYROIDECTOMY;  Surgeon: Eletha Boas, MD;  Location: [facility];  Service: General;  Laterality: Right;  75   POLYPECTOMY     POSTERIOR FUSION PEDICLE SCREW PLACEMENT N/A 01/23/2024   Procedure: Lumbar Four-Five Posterior Lateral and Interbody fusion;  Surgeon: Joshua Alm Hamilton, MD;  Location: [facility];  Service: Neurosurgery;  Laterality: N/A;   PROSTATE SURGERY  12/2012   no cancer   ROTATOR CUFF REPAIR Right 2019   TRANSURETHRAL RESECTION OF BLADDER NECK N/A 09/16/2020   Procedure: TRANSURETHRAL INCISION OF BLADDER NECK CONTRACTURE;  Surgeon: Matilda Senior, MD;  Location: [facility];  Service: Urology;  Laterality: N/A;  45 MINS   TRANSURETHRAL RESECTION OF BLADDER TUMOR WITH MITOMYCIN -C N/A 09/16/2020   Procedure: TRANSURETHRAL RESECTION OF BLADDER TUMOR WITH post op  GEMCITABINE ;  Surgeon: Matilda Senior, MD;  Location: [facility];  Service: Urology;  Laterality: N/A;   WISDOM TOOTH EXTRACTION       Allergies  Allergen Reactions   Lisinopril  Cough   Shellfish Allergy Hives    Objective:  General: Well developed, nourished, in no acute distress, alert and oriented x3   Dermatology: Skin is warm, dry and supple bilateral.  Right great toe is tender with evidence of an ingrowing nail. Pain on palpation noted to the border of the nail fold. The remaining nails appear unremarkable at this time.   Vascular: DP and PT pulses palpable.  No clinical evidence of vascular compromise  Neruologic: Grossly intact via light touch bilateral.  Musculoskeletal: No pedal deformity noted  Assesement: #1 Paronychia with ingrowing nail right great toe  Plan of Care:  -Patient evaluated.  -Discussed treatment alternatives and plan of care. Explained nail avulsion procedure and post procedure course to patient. -Patient opted for permanent partial nail avulsion of the ingrown portion of the nail.  -Prior to procedure, local anesthesia infiltration utilized using 3 ml of a 50:50 mixture of 2% plain lidocaine  and 0.5% plain marcaine  in a normal hallux block fashion and a betadine prep performed.  -Partial permanent nail avulsion with chemical matrixectomy performed using 3x30sec applications of phenol followed by alcohol flush.  -Light dressing applied.  Post care instructions provided -Return to clinic 3 weeks  Thresa EMERSON Sar, DPM [facility]  Dr. Thresa EMERSON Sar, DPM    [address]  [place], [place] [address]                Office [phone]  Fax [phone]

## 2024-10-15 NOTE — Patient Instructions (Signed)

## 2024-10-27 ENCOUNTER — Ambulatory Visit
Admission: RE | Admit: 2024-10-27 | Discharge: 2024-10-27 | Disposition: A | Discharge status: Home / Self Care | Source: Ambulatory Visit

## 2024-10-27 ENCOUNTER — Emergency Department (HOSPITAL_BASED_OUTPATIENT_CLINIC_OR_DEPARTMENT_OTHER): Admitting: Radiology

## 2024-10-27 ENCOUNTER — Other Ambulatory Visit: Payer: Self-pay

## 2024-10-27 ENCOUNTER — Inpatient Hospital Stay (HOSPITAL_BASED_OUTPATIENT_CLINIC_OR_DEPARTMENT_OTHER)
Admission: EM | Admit: 2024-10-27 | Discharge: 2024-10-29 | DRG: 440 | Disposition: A | Discharge status: Home / Self Care | Source: Ambulatory Visit | Attending: Internal Medicine | Admitting: Internal Medicine

## 2024-10-27 ENCOUNTER — Encounter (HOSPITAL_BASED_OUTPATIENT_CLINIC_OR_DEPARTMENT_OTHER): Payer: Self-pay

## 2024-10-27 ENCOUNTER — Emergency Department (HOSPITAL_BASED_OUTPATIENT_CLINIC_OR_DEPARTMENT_OTHER)

## 2024-10-27 ENCOUNTER — Ambulatory Visit
Admission: RE | Admit: 2024-10-27 | Discharge: 2024-10-27 | Disposition: A | Discharge status: Home / Self Care | Attending: Physician Assistant | Admitting: Physician Assistant

## 2024-10-27 DIAGNOSIS — R110 Nausea: Secondary | ICD-10-CM | POA: Diagnosis not present

## 2024-10-27 DIAGNOSIS — Z79899 Other long term (current) drug therapy: Secondary | ICD-10-CM

## 2024-10-27 DIAGNOSIS — J9811 Atelectasis: Secondary | ICD-10-CM | POA: Diagnosis not present

## 2024-10-27 DIAGNOSIS — Z8249 Family history of ischemic heart disease and other diseases of the circulatory system: Secondary | ICD-10-CM

## 2024-10-27 DIAGNOSIS — Z886 Allergy status to analgesic agent status: Secondary | ICD-10-CM

## 2024-10-27 DIAGNOSIS — Z8261 Family history of arthritis: Secondary | ICD-10-CM

## 2024-10-27 DIAGNOSIS — E213 Hyperparathyroidism, unspecified: Secondary | ICD-10-CM | POA: Diagnosis present

## 2024-10-27 DIAGNOSIS — Z981 Arthrodesis status: Secondary | ICD-10-CM

## 2024-10-27 DIAGNOSIS — K859 Acute pancreatitis without necrosis or infection, unspecified: Principal | ICD-10-CM | POA: Diagnosis present

## 2024-10-27 DIAGNOSIS — Z91013 Allergy to seafood: Secondary | ICD-10-CM

## 2024-10-27 DIAGNOSIS — Z800 Family history of malignant neoplasm of digestive organs: Secondary | ICD-10-CM

## 2024-10-27 DIAGNOSIS — Z7982 Long term (current) use of aspirin: Secondary | ICD-10-CM

## 2024-10-27 DIAGNOSIS — Z6832 Body mass index (BMI) 32.0-32.9, adult: Secondary | ICD-10-CM

## 2024-10-27 DIAGNOSIS — K219 Gastro-esophageal reflux disease without esophagitis: Secondary | ICD-10-CM | POA: Diagnosis present

## 2024-10-27 DIAGNOSIS — E66811 Obesity, class 1: Secondary | ICD-10-CM | POA: Diagnosis present

## 2024-10-27 DIAGNOSIS — Z888 Allergy status to other drugs, medicaments and biological substances status: Secondary | ICD-10-CM

## 2024-10-27 DIAGNOSIS — F32A Depression, unspecified: Secondary | ICD-10-CM | POA: Diagnosis present

## 2024-10-27 DIAGNOSIS — R079 Chest pain, unspecified: Secondary | ICD-10-CM | POA: Diagnosis not present

## 2024-10-27 DIAGNOSIS — Z83719 Family history of colon polyps, unspecified: Secondary | ICD-10-CM

## 2024-10-27 DIAGNOSIS — Z803 Family history of malignant neoplasm of breast: Secondary | ICD-10-CM

## 2024-10-27 DIAGNOSIS — Z8551 Personal history of malignant neoplasm of bladder: Secondary | ICD-10-CM

## 2024-10-27 DIAGNOSIS — N200 Calculus of kidney: Secondary | ICD-10-CM | POA: Diagnosis not present

## 2024-10-27 DIAGNOSIS — E785 Hyperlipidemia, unspecified: Secondary | ICD-10-CM | POA: Diagnosis present

## 2024-10-27 DIAGNOSIS — I10 Essential (primary) hypertension: Secondary | ICD-10-CM | POA: Diagnosis present

## 2024-10-27 DIAGNOSIS — R059 Cough, unspecified: Secondary | ICD-10-CM | POA: Diagnosis not present

## 2024-10-27 DIAGNOSIS — Z8419 Family history of other disorders of kidney and ureter: Secondary | ICD-10-CM

## 2024-10-27 DIAGNOSIS — R1011 Right upper quadrant pain: Secondary | ICD-10-CM | POA: Diagnosis not present

## 2024-10-27 DIAGNOSIS — Z860100 Personal history of colon polyps, unspecified: Secondary | ICD-10-CM

## 2024-10-27 DIAGNOSIS — K5730 Diverticulosis of large intestine without perforation or abscess without bleeding: Secondary | ICD-10-CM | POA: Diagnosis not present

## 2024-10-27 DIAGNOSIS — Z791 Long term (current) use of non-steroidal anti-inflammatories (NSAID): Secondary | ICD-10-CM

## 2024-10-27 DIAGNOSIS — Z83438 Family history of other disorder of lipoprotein metabolism and other lipidemia: Secondary | ICD-10-CM

## 2024-10-27 DIAGNOSIS — R509 Fever, unspecified: Secondary | ICD-10-CM | POA: Diagnosis present

## 2024-10-27 DIAGNOSIS — Z1152 Encounter for screening for COVID-19: Secondary | ICD-10-CM

## 2024-10-27 DIAGNOSIS — K429 Umbilical hernia without obstruction or gangrene: Secondary | ICD-10-CM | POA: Diagnosis not present

## 2024-10-27 LAB — LACTIC ACID, PLASMA: Lactic Acid, Venous: 1.3 mmol/L (ref 0.5–1.9)

## 2024-10-27 LAB — URINALYSIS, ROUTINE W REFLEX MICROSCOPIC
Bacteria, UA: NONE SEEN
Bilirubin Urine: NEGATIVE
Glucose, UA: NEGATIVE mg/dL
Leukocytes,Ua: NEGATIVE
Nitrite: NEGATIVE
Specific Gravity, Urine: >1.046 — ABNORMAL HIGH (ref 1.005–1.030)
pH: 6 (ref 5.0–8.0)

## 2024-10-27 LAB — TROPONIN T, HIGH SENSITIVITY
Troponin T High Sensitivity: <15 ng/L (ref 0–19)
Troponin T High Sensitivity: <15 ng/L (ref 0–19)

## 2024-10-27 LAB — CBC
HCT: 41 % (ref 39.0–52.0)
Hemoglobin: 13.4 g/dL (ref 13.0–17.0)
MCH: 27.7 pg (ref 26.0–34.0)
MCHC: 32.7 g/dL (ref 30.0–36.0)
MCV: 84.7 fL (ref 80.0–100.0)
Platelets: 170 K/uL (ref 150–400)
RBC: 4.84 MIL/uL (ref 4.22–5.81)
RDW: 14.3 % (ref 11.5–15.5)
WBC: 6.9 K/uL (ref 4.0–10.5)
nRBC: 0 % (ref 0.0–0.2)

## 2024-10-27 LAB — COMPREHENSIVE METABOLIC PANEL WITH GFR
ALT: 12 U/L (ref 0–44)
AST: 21 U/L (ref 15–41)
Albumin: 4.3 g/dL (ref 3.5–5.0)
Alkaline Phosphatase: 82 U/L (ref 38–126)
Anion gap: 13 (ref 5–15)
BUN: 19 mg/dL (ref 8–23)
CO2: 25 mmol/L (ref 22–32)
Calcium: 9.3 mg/dL (ref 8.9–10.3)
Chloride: 102 mmol/L (ref 98–111)
Creatinine, Ser: 1.42 mg/dL — ABNORMAL HIGH (ref 0.61–1.24)
GFR, Estimated: 53 mL/min — ABNORMAL LOW (ref >60)
Glucose, Bld: 104 mg/dL — ABNORMAL HIGH (ref 70–99)
Potassium: 3.6 mmol/L (ref 3.5–5.1)
Sodium: 140 mmol/L (ref 135–145)
Total Bilirubin: 0.9 mg/dL (ref 0.0–1.2)
Total Protein: 7.5 g/dL (ref 6.5–8.1)

## 2024-10-27 LAB — LIPASE, BLOOD: Lipase: 75 U/L — ABNORMAL HIGH (ref 11–51)

## 2024-10-27 MED ORDER — MORPHINE SULFATE (PF) 4 MG/ML IV SOLN: 4.0000 mg | INTRAVENOUS | Status: DC | PRN

## 2024-10-27 MED ORDER — ONDANSETRON HCL 4 MG/2ML IJ SOLN
4.0000 mg | Freq: Once | INTRAMUSCULAR | Status: AC
  Administered 2024-10-27: 4 mg via INTRAVENOUS
  Filled 2024-10-27: qty 2

## 2024-10-27 MED ORDER — OXYCODONE-ACETAMINOPHEN 5-325 MG PO TABS
1.0000 | ORAL_TABLET | ORAL | Status: DC | PRN
  Administered 2024-10-28 (×2): 1 via ORAL
  Filled 2024-10-27 (×2): qty 1

## 2024-10-27 MED ORDER — LACTATED RINGERS IV BOLUS
1000.0000 mL | Freq: Once | INTRAVENOUS | Status: AC
  Administered 2024-10-27: 1000 mL via INTRAVENOUS

## 2024-10-27 MED ORDER — IOHEXOL 300 MG/ML  SOLN
100.0000 mL | Freq: Once | INTRAMUSCULAR | Status: AC | PRN
  Administered 2024-10-27: 100 mL via INTRAVENOUS

## 2024-10-27 MED ORDER — MORPHINE SULFATE (PF) 4 MG/ML IV SOLN
4.0000 mg | Freq: Once | INTRAVENOUS | Status: AC
  Administered 2024-10-27: 4 mg via INTRAVENOUS
  Filled 2024-10-27: qty 1

## 2024-10-27 MED ORDER — ONDANSETRON HCL 4 MG/2ML IJ SOLN: 4.0000 mg | Freq: Four times a day (QID) | INTRAMUSCULAR | Status: DC | PRN

## 2024-10-27 MED ORDER — ACETAMINOPHEN 500 MG PO TABS
1000.0000 mg | ORAL_TABLET | Freq: Once | ORAL | Status: AC
  Administered 2024-10-27: 1000 mg via ORAL
  Filled 2024-10-27: qty 2

## 2024-10-27 MED ORDER — ONDANSETRON 4 MG PO TBDP
4.0000 mg | ORAL_TABLET | Freq: Once | ORAL | Status: AC
  Administered 2024-10-27: 4 mg via ORAL

## 2024-10-27 NOTE — ED Provider Notes (Addendum)
 [place] EMERGENCY DEPARTMENT AT [facility] Provider Note   CSN: [id number] Arrival date & time: 10/27/24  1533     Patient presents with: Abdominal Pain   Hector [name] is a 73 y.o. male.    Abdominal Pain  Patient is a 73 year old male with a past medical history significant for arthritis, HLD, hyperparathyroidism, hypertension, CKD, reflux, kidney stones, bladder cancer  Presents emergency room today with complaints of left sided upper abdominal and left lower chest pain.  He has been very nauseous since Friday evening.  He states the pain is persistent and seems to radiate to his back he has not had any syncope or near syncope.  Has a history of gallstones but has not had a cholecystectomy.  He has some diarrhea over the past few days as well.  No blood in his stool.      Prior to Admission medications   Medication Sig Start Date End Date Taking? Authorizing Provider  acetaminophen  (TYLENOL ) 500 MG tablet Take 1,000 mg by mouth every 6 (six) hours as needed for moderate pain (pain score 4-6).    [provider]  amLODipine  (NORVASC ) 5 MG tablet Take 5 mg by mouth daily.    [provider]  aspirin  EC 81 MG tablet Take 81 mg by mouth daily. Swallow whole.    [provider]  cetirizine (ZYRTEC) 10 MG tablet Take 10 mg by mouth daily.    [provider]  Cholecalciferol (VITAMIN D ) 50 MCG (2000 UT) tablet Take 2,000 Units by mouth daily.    [provider]  fluticasone  (FLONASE ) 50 MCG/ACT nasal spray USE 2 SPRAYS IN EACH NOSTRIL DAILY 08/18/24   Rollene Almarie LABOR, MD  furosemide  (LASIX ) 20 MG tablet Take 1 tablet (20 mg total) by mouth daily as needed for edema. 02/07/24   Rollene Almarie LABOR, MD  loratadine (CLARITIN) 10 MG tablet Take 10 mg by mouth daily.    [provider]  losartan  (COZAAR ) 100 MG tablet TAKE 1 TABLET DAILY 04/24/24   Rollene Almarie LABOR, MD  lovastatin  (MEVACOR ) 20 MG tablet Take 20  mg by mouth at bedtime.    [provider]  meloxicam (MOBIC) 15 MG tablet Take 15 mg by mouth daily.     [provider]  pantoprazole  (PROTONIX ) 40 MG tablet TAKE 1 TABLET DAILY 10/08/24   Rollene Almarie LABOR, MD  sertraline  (ZOLOFT ) 50 MG tablet Take 50 mg by mouth daily. 1/2 tablet daily - prescribed by the [facility]    [provider]  tamsulosin  (FLOMAX ) 0.4 MG CAPS capsule Take 1 capsule (0.4 mg total) by mouth daily. 11/15/20   Geroldine Berg, MD  terazosin  (HYTRIN ) 10 MG capsule Take 10 mg by mouth at bedtime.     [provider]  triamcinolone  cream (KENALOG ) 0.1 % APPLY TOPICALLY TWICE A DAY 08/18/24   Rollene Almarie LABOR, MD    Allergies: Lisinopril  and Shellfish allergy    Review of Systems  Gastrointestinal:  Positive for abdominal pain.    Updated Vital Signs BP 118/81   Pulse 65   Temp 100.2 F (37.9 C) (Oral)   Resp 20   SpO2 92%   Physical Exam Vitals and nursing note reviewed.  Constitutional:      General: He is in acute distress.  HENT:     Head: Normocephalic and atraumatic.     Nose: Nose normal.  Eyes:     General: No scleral icterus. Cardiovascular:  Rate and Rhythm: Normal rate and regular rhythm.     Pulses: Normal pulses.     Heart sounds: Normal heart sounds.  Pulmonary:     Effort: Pulmonary effort is normal. No respiratory distress.     Breath sounds: No wheezing.  Abdominal:     Palpations: Abdomen is soft.     Tenderness: There is abdominal tenderness.     Comments: Epigastric and right upper quadrant and left upper quadrant tenderness  Musculoskeletal:     Cervical back: Normal range of motion.     Right lower leg: No edema.     Left lower leg: No edema.  Skin:    General: Skin is warm and dry.     Capillary Refill: Capillary refill takes less than 2 seconds.  Neurological:     Mental Status: He is alert. Mental status is at baseline.  Psychiatric:        Mood and Affect: Mood normal.         Behavior: Behavior normal.     (all labs ordered are listed, but only abnormal results are displayed) Labs Reviewed  LIPASE, BLOOD - Abnormal; Notable for the following components:      Result Value   Lipase 75 (*)    All other components within normal limits  COMPREHENSIVE METABOLIC PANEL WITH GFR - Abnormal; Notable for the following components:   Glucose, Bld 104 (*)    Creatinine, Ser 1.42 (*)    GFR, Estimated 53 (*)    All other components within normal limits  URINALYSIS, ROUTINE W REFLEX MICROSCOPIC - Abnormal; Notable for the following components:   Specific Gravity, Urine >1.046 (*)    Hgb urine dipstick MODERATE (*)    Ketones, ur TRACE (*)    Protein, ur TRACE (*)    All other components within normal limits  CULTURE, BLOOD (SINGLE)  RESP PANEL BY RT-PCR (RSV, FLU A&B, COVID)  RVPGX2  CBC  LACTIC ACID, PLASMA  TROPONIN T, HIGH SENSITIVITY  TROPONIN T, HIGH SENSITIVITY    EKG: None  Radiology: DG Chest 2 View Result Date: 10/27/2024 EXAM: 2 VIEW(S) XRAY OF THE CHEST 10/27/2024 09:34:00 PM COMPARISON: None available. CLINICAL HISTORY: Cough, CP (chest pain). FINDINGS: LUNGS AND PLEURA: Mild left basilar atelectasis is noted. No pleural effusion. No pneumothorax. HEART AND MEDIASTINUM: No acute abnormality of the cardiac and mediastinal silhouettes. BONES AND SOFT TISSUES: No acute osseous abnormality. Visualized upper abdomen is within normal limits. IMPRESSION: 1. Mild left basilar atelectasis. Electronically signed by: Oneil Devonshire MD 10/27/2024 09:39 PM EST RP Workstation: [id number]   CT ABDOMEN PELVIS W CONTRAST Result Date: 10/27/2024 EXAM: CT ABDOMEN AND PELVIS WITH CONTRAST 10/27/2024 08:53:30 PM TECHNIQUE: CT of the abdomen and pelvis was performed with the administration of intravenous contrast. Multiplanar reformatted images are provided for review. Automated exposure control, iterative reconstruction, and/or weight-based adjustment of the mA/kV was  utilized to reduce the radiation dose to as low as reasonably achievable. COMPARISON: 12/03/2020 CLINICAL HISTORY: Chest and abdominal pain for several days. FINDINGS: LOWER CHEST: Lung bases are free of acute infiltrate or sizable effusion. LIVER: The liver is within normal limits. GALLBLADDER AND BILE DUCTS: The gallbladder is within normal limits. No biliary ductal dilatation. SPLEEN: The spleen is unremarkable. PANCREAS: Pancreas shows some mild peripancreatic inflammatory change in the head and uncinate process consistent with focal acute pancreatitis. The body and tail are within normal limits. ADRENAL GLANDS: The adrenal glands are unremarkable. KIDNEYS, URETERS AND BLADDER: The kidneys show a  normal enhancement pattern. Punctate nonobstructing left renal stone is seen. The bladder is partially distended. GI AND BOWEL: Stomach is unremarkable. Small bowel is unremarkable. No obstructive or inflammatory changes of the colon are seen. Minimal diverticular changes of the colon are noted without diverticulitis. The appendix is within normal limits. PERITONEUM AND RETROPERITONEUM: No ascites. No free air. VASCULATURE: The aorta is within normal limits. LYMPH NODES: No lymphadenopathy. REPRODUCTIVE ORGANS: The prostate is unremarkable. BONES AND SOFT TISSUES: No acute osseous abnormality is seen. A small fat-containing umbilical hernia is noted. IMPRESSION: 1. Focal acute pancreatitis involving the pancreatic head and uncinate process. Electronically signed by: Oneil Devonshire MD 10/27/2024 08:59 PM EST RP Workstation: [id number]     Procedures   Medications Ordered in the ED  oxyCODONE -acetaminophen  (PERCOCET/ROXICET) 5-325 MG per tablet 1 tablet (has no administration in time range)  ondansetron  (ZOFRAN ) injection 4 mg (has no administration in time range)  morphine  (PF) 4 MG/ML injection 4 mg (has no administration in time range)  morphine  (PF) 4 MG/ML injection 4 mg (4 mg Intravenous Given 10/27/24 2027)   ondansetron  (ZOFRAN ) injection 4 mg (4 mg Intravenous Given 10/27/24 2026)  lactated ringers  bolus 1,000 mL (0 mLs Intravenous Stopped 10/28/24 0025)  acetaminophen  (TYLENOL ) tablet 1,000 mg (1,000 mg Oral Given 10/27/24 2028)  iohexol  (OMNIPAQUE ) 300 MG/ML solution 100 mL (100 mLs Intravenous Contrast Given 10/27/24 2042)    Clinical Course as of 10/28/24 0034  Mon Oct 27, 2024  1959 Friday night pt began feeling nauseated after food. Saturday pt started having persistent left chest pain. Today diarrhea. Headache.  [WF]  Tue Oct 28, 2024  0033 Consult to hospitalist at 9:44, discussed with secretary at 11:30 PM, second consult order placed at 12:33 AM [WF]    Clinical Course User Index [WF] Neldon Hamp RAMAN, [place]                                 Medical Decision Making Amount and/or Complexity of Data Reviewed Labs: ordered. Radiology: ordered.  Risk OTC drugs. Prescription drug management. Decision regarding hospitalization.   This patient presents to the ED for concern of abd pain, this involves a number of treatment options, and is a complaint that carries with it a moderate to high risk of complications and morbidity. A differential diagnosis was considered for the patient's symptoms which is discussed below:   The causes of generalized abdominal pain include but are not limited to AAA, mesenteric ischemia, appendicitis, diverticulitis, DKA, gastritis, gastroenteritis, AMI, nephrolithiasis, pancreatitis, peritonitis, adrenal insufficiency,lead poisoning, iron toxicity, intestinal ischemia, constipation, UTI,SBO/LBO, splenic rupture, biliary disease, IBD, IBS, PUD, or hepatitis.   Co morbidities: Discussed in HPI   Brief History:  Patient is a 73 year old male with a past medical history significant for arthritis, HLD, hyperparathyroidism, hypertension, CKD, reflux, kidney stones, bladder cancer  Presents emergency room today with complaints of left sided upper abdominal  and left lower chest pain.  He has been very nauseous since Friday evening.  He states the pain is persistent and seems to radiate to his back he has not had any syncope or near syncope.  Has a history of gallstones but has not had a cholecystectomy.  He has some diarrhea over the past few days as well.  No blood in his stool.    EMR reviewed including pt PMHx, past surgical history and past visits to ER.   See HPI for more details  Lab Tests:  I ordered and independently interpreted labs. Labs notable for CMP and CBC unremarkable lipase elevated concerning for pancreatitis   Imaging Studies:  Abnormal findings. I personally reviewed all imaging studies. Imaging notable for CT shows pancreatitis.  Normal gallbladder   Cardiac Monitoring:  The patient was maintained on a cardiac monitor.  I personally viewed and interpreted the cardiac monitored which showed an underlying rhythm of: NSR EKG non-ischemic   Medicines ordered:  I ordered medication including crystalloid infusion, Tylenol  for fever, morphine  for pain, Zofran  for nausea  Reevaluation of the patient after these medicines showed that the patient improved I have reviewed the patients home medicines and have made adjustments as needed   Critical Interventions:     Consults/Attending Physician   I discussed this case with my attending physician who cosigned this note including patient's presenting symptoms, physical exam, and planned diagnostics and interventions. Attending physician stated agreement with plan or made changes to plan which were implemented.   Reevaluation:  After the interventions noted above I re-evaluated patient and found that they have :improved   Social Determinants of Health:      Problem List / ED Course:  First episode of pancreatitis.  Does not appear to be gallstone pancreatitis given normal bilirubin and no transaminitis normal biliary tree on CT ultrasound unavailable at  this time.  Therefore doubt gallstone pancreatitis.  Patient does not drink alcohol heavily.  He is on several medications that can cause pancreatitis: furosemide , losartan , acetaminophen , lovastatin , meloxicam, and triamcinolone .    Dispostion:  After consideration of the diagnostic results and the patients response to treatment, I feel that the patent would benefit from admission.     Final diagnoses:  Acute pancreatitis without infection or necrosis, unspecified pancreatitis type    ED Discharge Orders     None          Neldon Hamp RAMAN, [place] 10/27/24 2251    Neldon Hamp [place], [place] 10/28/24 9964    Randol Simmonds, MD 10/31/24 [phone]

## 2024-10-27 NOTE — ED Provider Notes (Incomplete Revision)
 [place] EMERGENCY DEPARTMENT AT [facility] Provider Note   CSN: [id number] Arrival date & time: 10/27/24  1533     Patient presents with: Abdominal Pain   Hector [name] is a 73 y.o. male.    Abdominal Pain  Patient is a 73 year old male with a past medical history significant for arthritis, HLD, hyperparathyroidism, hypertension, CKD, reflux, kidney stones, bladder cancer  Presents emergency room today with complaints of left sided upper abdominal and left lower chest pain.  He has been very nauseous since Friday evening.  He states the pain is persistent and seems to radiate to his back he has not had any syncope or near syncope.  Has a history of gallstones but has not had a cholecystectomy.  He has some diarrhea over the past few days as well.  No blood in his stool.      Prior to Admission medications   Medication Sig Start Date End Date Taking? Authorizing Provider  acetaminophen  (TYLENOL ) 500 MG tablet Take 1,000 mg by mouth every 6 (six) hours as needed for moderate pain (pain score 4-6).    [provider]  amLODipine  (NORVASC ) 5 MG tablet Take 5 mg by mouth daily.    [provider]  aspirin  EC 81 MG tablet Take 81 mg by mouth daily. Swallow whole.    [provider]  cetirizine (ZYRTEC) 10 MG tablet Take 10 mg by mouth daily.    [provider]  Cholecalciferol (VITAMIN D ) 50 MCG (2000 UT) tablet Take 2,000 Units by mouth daily.    [provider]  fluticasone  (FLONASE ) 50 MCG/ACT nasal spray USE 2 SPRAYS IN EACH NOSTRIL DAILY 08/18/24   Rollene Almarie LABOR, MD  furosemide  (LASIX ) 20 MG tablet Take 1 tablet (20 mg total) by mouth daily as needed for edema. 02/07/24   Rollene Almarie LABOR, MD  loratadine (CLARITIN) 10 MG tablet Take 10 mg by mouth daily.    [provider]  losartan  (COZAAR ) 100 MG tablet TAKE 1 TABLET DAILY 04/24/24   Rollene Almarie LABOR, MD  lovastatin  (MEVACOR ) 20 MG tablet Take 20  mg by mouth at bedtime.    [provider]  meloxicam (MOBIC) 15 MG tablet Take 15 mg by mouth daily.     [provider]  pantoprazole  (PROTONIX ) 40 MG tablet TAKE 1 TABLET DAILY 10/08/24   Rollene Almarie LABOR, MD  sertraline  (ZOLOFT ) 50 MG tablet Take 50 mg by mouth daily. 1/2 tablet daily - prescribed by the [facility]    [provider]  tamsulosin  (FLOMAX ) 0.4 MG CAPS capsule Take 1 capsule (0.4 mg total) by mouth daily. 11/15/20   Geroldine Berg, MD  terazosin  (HYTRIN ) 10 MG capsule Take 10 mg by mouth at bedtime.     [provider]  triamcinolone  cream (KENALOG ) 0.1 % APPLY TOPICALLY TWICE A DAY 08/18/24   Rollene Almarie LABOR, MD    Allergies: Lisinopril  and Shellfish allergy    Review of Systems  Gastrointestinal:  Positive for abdominal pain.    Updated Vital Signs BP 136/78   Pulse 66   Temp 100.2 F (37.9 C) (Oral)   Resp 12   SpO2 93%   Physical Exam Vitals and nursing note reviewed.  Constitutional:      General: He is in acute distress.  HENT:     Head: Normocephalic and atraumatic.     Nose: Nose normal.  Eyes:     General: No scleral icterus. Cardiovascular:  Rate and Rhythm: Normal rate and regular rhythm.     Pulses: Normal pulses.     Heart sounds: Normal heart sounds.  Pulmonary:     Effort: Pulmonary effort is normal. No respiratory distress.     Breath sounds: No wheezing.  Abdominal:     Palpations: Abdomen is soft.     Tenderness: There is abdominal tenderness.     Comments: Epigastric and right upper quadrant and left upper quadrant tenderness  Musculoskeletal:     Cervical back: Normal range of motion.     Right lower leg: No edema.     Left lower leg: No edema.  Skin:    General: Skin is warm and dry.     Capillary Refill: Capillary refill takes less than 2 seconds.  Neurological:     Mental Status: He is alert. Mental status is at baseline.  Psychiatric:        Mood and Affect: Mood normal.         Behavior: Behavior normal.     (all labs ordered are listed, but only abnormal results are displayed) Labs Reviewed  LIPASE, BLOOD - Abnormal; Notable for the following components:      Result Value   Lipase 75 (*)    All other components within normal limits  COMPREHENSIVE METABOLIC PANEL WITH GFR - Abnormal; Notable for the following components:   Glucose, Bld 104 (*)    Creatinine, Ser 1.42 (*)    GFR, Estimated 53 (*)    All other components within normal limits  CULTURE, BLOOD (SINGLE)  CBC  LACTIC ACID, PLASMA  LACTIC ACID, PLASMA  URINALYSIS, ROUTINE W REFLEX MICROSCOPIC  TROPONIN T, HIGH SENSITIVITY  TROPONIN T, HIGH SENSITIVITY    EKG: None  Radiology: DG Chest 2 View Result Date: 10/27/2024 EXAM: 2 VIEW(S) XRAY OF THE CHEST 10/27/2024 09:34:00 PM COMPARISON: None available. CLINICAL HISTORY: Cough, CP (chest pain). FINDINGS: LUNGS AND PLEURA: Mild left basilar atelectasis is noted. No pleural effusion. No pneumothorax. HEART AND MEDIASTINUM: No acute abnormality of the cardiac and mediastinal silhouettes. BONES AND SOFT TISSUES: No acute osseous abnormality. Visualized upper abdomen is within normal limits. IMPRESSION: 1. Mild left basilar atelectasis. Electronically signed by: Oneil Devonshire MD 10/27/2024 09:39 PM EST RP Workstation: [id number]   CT ABDOMEN PELVIS W CONTRAST Result Date: 10/27/2024 EXAM: CT ABDOMEN AND PELVIS WITH CONTRAST 10/27/2024 08:53:30 PM TECHNIQUE: CT of the abdomen and pelvis was performed with the administration of intravenous contrast. Multiplanar reformatted images are provided for review. Automated exposure control, iterative reconstruction, and/or weight-based adjustment of the mA/kV was utilized to reduce the radiation dose to as low as reasonably achievable. COMPARISON: 12/03/2020 CLINICAL HISTORY: Chest and abdominal pain for several days. FINDINGS: LOWER CHEST: Lung bases are free of acute infiltrate or sizable effusion. LIVER: The  liver is within normal limits. GALLBLADDER AND BILE DUCTS: The gallbladder is within normal limits. No biliary ductal dilatation. SPLEEN: The spleen is unremarkable. PANCREAS: Pancreas shows some mild peripancreatic inflammatory change in the head and uncinate process consistent with focal acute pancreatitis. The body and tail are within normal limits. ADRENAL GLANDS: The adrenal glands are unremarkable. KIDNEYS, URETERS AND BLADDER: The kidneys show a normal enhancement pattern. Punctate nonobstructing left renal stone is seen. The bladder is partially distended. GI AND BOWEL: Stomach is unremarkable. Small bowel is unremarkable. No obstructive or inflammatory changes of the colon are seen. Minimal diverticular changes of the colon are noted without diverticulitis. The appendix is within normal limits. PERITONEUM  AND RETROPERITONEUM: No ascites. No free air. VASCULATURE: The aorta is within normal limits. LYMPH NODES: No lymphadenopathy. REPRODUCTIVE ORGANS: The prostate is unremarkable. BONES AND SOFT TISSUES: No acute osseous abnormality is seen. A small fat-containing umbilical hernia is noted. IMPRESSION: 1. Focal acute pancreatitis involving the pancreatic head and uncinate process. Electronically signed by: Oneil Devonshire MD 10/27/2024 08:59 PM EST RP Workstation: [id number]     Procedures   Medications Ordered in the ED  oxyCODONE -acetaminophen  (PERCOCET/ROXICET) 5-325 MG per tablet 1 tablet (has no administration in time range)  ondansetron  (ZOFRAN ) injection 4 mg (has no administration in time range)  morphine  (PF) 4 MG/ML injection 4 mg (has no administration in time range)  morphine  (PF) 4 MG/ML injection 4 mg (4 mg Intravenous Given 10/27/24 2027)  ondansetron  (ZOFRAN ) injection 4 mg (4 mg Intravenous Given 10/27/24 2026)  lactated ringers  bolus 1,000 mL (1,000 mLs Intravenous New Bag/Given 10/27/24 2056)  acetaminophen  (TYLENOL ) tablet 1,000 mg (1,000 mg Oral Given 10/27/24 2028)  iohexol   (OMNIPAQUE ) 300 MG/ML solution 100 mL (100 mLs Intravenous Contrast Given 10/27/24 2042)    Clinical Course as of 10/27/24 2144  Mon Oct 27, 2024  1959 Friday night pt began feeling nauseated after food. Saturday pt started having persistent left chest pain. Today diarrhea. Headache.  [WF]    Clinical Course User Index [WF] Neldon Hamp RAMAN, [place]                                 Medical Decision Making Amount and/or Complexity of Data Reviewed Labs: ordered. Radiology: ordered.  Risk OTC drugs. Prescription drug management. Decision regarding hospitalization.   This patient presents to the ED for concern of abd pain, this involves a number of treatment options, and is a complaint that carries with it a moderate to high risk of complications and morbidity. A differential diagnosis was considered for the patient's symptoms which is discussed below:   The causes of generalized abdominal pain include but are not limited to AAA, mesenteric ischemia, appendicitis, diverticulitis, DKA, gastritis, gastroenteritis, AMI, nephrolithiasis, pancreatitis, peritonitis, adrenal insufficiency,lead poisoning, iron toxicity, intestinal ischemia, constipation, UTI,SBO/LBO, splenic rupture, biliary disease, IBD, IBS, PUD, or hepatitis.   Co morbidities: Discussed in HPI   Brief History:  Patient is a 73 year old male with a past medical history significant for arthritis, HLD, hyperparathyroidism, hypertension, CKD, reflux, kidney stones, bladder cancer  Presents emergency room today with complaints of left sided upper abdominal and left lower chest pain.  He has been very nauseous since Friday evening.  He states the pain is persistent and seems to radiate to his back he has not had any syncope or near syncope.  Has a history of gallstones but has not had a cholecystectomy.  He has some diarrhea over the past few days as well.  No blood in his stool.    EMR reviewed including pt PMHx, past surgical  history and past visits to ER.   See HPI for more details   Lab Tests:  I ordered and independently interpreted labs. Labs notable for CMP and CBC unremarkable lipase elevated concerning for pancreatitis   Imaging Studies:  Abnormal findings. I personally reviewed all imaging studies. Imaging notable for CT shows pancreatitis.  Normal gallbladder   Cardiac Monitoring:  The patient was maintained on a cardiac monitor.  I personally viewed and interpreted the cardiac monitored which showed an underlying rhythm of: NSR EKG non-ischemic   Medicines  ordered:  I ordered medication including crystalloid infusion, Tylenol  for fever, morphine  for pain, Zofran  for nausea  Reevaluation of the patient after these medicines showed that the patient improved I have reviewed the patients home medicines and have made adjustments as needed   Critical Interventions:     Consults/Attending Physician   I discussed this case with my attending physician who cosigned this note including patient's presenting symptoms, physical exam, and planned diagnostics and interventions. Attending physician stated agreement with plan or made changes to plan which were implemented.   Reevaluation:  After the interventions noted above I re-evaluated patient and found that they have :improved   Social Determinants of Health:      Problem List / ED Course:  First episode of pancreatitis.  Does not appear to be gallstone pancreatitis given normal bilirubin and no transaminitis normal biliary tree on CT ultrasound unavailable at this time.  Therefore doubt gallstone pancreatitis.  Patient does not drink alcohol heavily.  He is on several medications that can cause pancreatitis: furosemide , losartan , acetaminophen , lovastatin , meloxicam, and triamcinolone .    Dispostion:  After consideration of the diagnostic results and the patients response to treatment, I feel that the patent would benefit from  admission.     Final diagnoses:  Acute pancreatitis without infection or necrosis, unspecified pancreatitis type    ED Discharge Orders     None          Neldon Hamp RAMAN, [place] 10/27/24 2251

## 2024-10-27 NOTE — Discharge Instructions (Signed)
 You were seen today for concerns of chest pain, bloating, right upper quadrant abdominal pain.  Based on the constellation of your symptoms I am concerned that you may be having a problem with your gallbladder and I recommend advanced imaging to be completed at the emergency room or an nearby med center per your preference.  We have supplied you with a dose of Zofran  4 mg to assist with your nausea while you are en route to the emergency room.  You have declined EMS transportation and decided to go via private vehicle.  If at any point you feel you cannot make it to the emergency room please call 911 for assistance

## 2024-10-27 NOTE — ED Notes (Signed)
 Blood obtained in Triage.SABRASABRA

## 2024-10-27 NOTE — ED Triage Notes (Signed)
 Pt c/o L sided pain onset 3 days ago, started after having a salad. States it has progressed to NVD, seen at [facility] for same, sent for r/o gallbladder.

## 2024-10-27 NOTE — ED Notes (Addendum)
 Patient is being discharged from the Urgent Care and sent to the Emergency Department via private vehicle . Per Rocky Mecum PA, patient is in need of higher level of care due to chest pain x 4 days. Patient is aware and verbalizes understanding of plan of care.  Vitals:   10/27/24 1353  BP: 115/67  Pulse: 78  Resp: 19  Temp: 98.5 F (36.9 C)  SpO2: 98%

## 2024-10-27 NOTE — ED Triage Notes (Addendum)
 Pt presents with complaints of chills, headaches, diarrhea, gas/bloating/heartburn, and mid-chest pain x 4 days. Was thinking the pain was initially heartburn. Took Mylanta, Pepto-Bismol, and anti-acids with no relief/improvement. Currently rates pain in chest an 8/10. Describes as discomfort and sharp. Pt looks visibly uncomfortable in triage. Wife was ill two weeks ago, had flu symptoms.

## 2024-10-27 NOTE — ED Notes (Signed)
 Pt states he is now feeling SOB and weak. Erin PA currently in room.

## 2024-10-27 NOTE — ED Notes (Signed)
 EKG handed off to [facility] PA.

## 2024-10-27 NOTE — ED Provider Notes (Signed)
 [facility]    CSN: [id number] Arrival date & time: 10/27/24  1342      History   Chief Complaint Chief Complaint  Patient presents with   Bloated   Chest Pain   Weakness   Shortness of Breath    HPI Hector [name] is a 73 y.o. male.   HPI  Pt is here today with concerns for bloating, feeling gassy and sharp, stabbing sternal chest pain He reports that the last meal he ate was a salad on Friday and then started to develop the gassy sensation and bloating. He was able to eat some scrambled eggs this AM and has been able to drink water  and gatorade but further PO intake has been limited due to nausea. He reports that his chest pain seems to get better when he gets up and moves. He states it feels like his muscles are very stiff in the chest, shoulders, hips and abdomen.He reports that the chest pain started last night.  He reports he was not able to sleep well last night and had to sit upright in a chair. He also reports diarrhea- states he has had 3 liquid consistency stools today - denies clay coloration but states they are black after using Pepto.  Interventions: Mylanta, Pepto, Tylenol  He states his wife had flu-like symptoms about 2 weeks ago    Past Medical History:  Diagnosis Date   Adhesive capsulitis 07/31/2018   Allergy    Arthritis    Bladder cancer (HCC)    Chronic kidney disease    kidney stones   GERD (gastroesophageal reflux disease)    History of kidney stones    Hyperlipidemia    Hyperparathyroidism    Hypertension     Patient Active Problem List   Diagnosis Date Noted   Left ankle swelling 02/07/2024   S/P lumbar fusion 01/23/2024   Jaw pain 05/07/2023   Left foot pain 06/30/2022   Numbness and tingling of both legs 07/25/2021   Chronic left shoulder pain 11/12/2020   Chronic 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 03/24/2016   Hyperparathyroidism 04/13/2015   Vitamin D  deficiency 04/09/2015   Osteoarthritis 11/06/2014    Essential hypertension 11/06/2014   Hyperlipidemia 11/06/2014   GERD (gastroesophageal reflux disease) 11/06/2014   Syncope 11/06/2014   Routine general medical examination at a health care facility 11/06/2014    Past Surgical History:  Procedure Laterality Date   COLONOSCOPY     CYSTOSCOPY N/A 09/16/2020   Procedure: CYSTOSCOPY;  Surgeon: Matilda Senior, MD;  Location: [facility];  Service: Urology;  Laterality: N/A;   PARATHYROIDECTOMY Right 09/20/2021   Procedure: RIGHT INFERIOR PARATHYROIDECTOMY;  Surgeon: Eletha Boas, MD;  Location: [facility];  Service: General;  Laterality: Right;  77   POLYPECTOMY     POSTERIOR FUSION PEDICLE SCREW PLACEMENT N/A 01/23/2024   Procedure: Lumbar Four-Five Posterior Lateral and Interbody fusion;  Surgeon: Joshua Alm Hamilton, MD;  Location: [facility] OR;  Service: Neurosurgery;  Laterality: N/A;   PROSTATE SURGERY  12/2012   no cancer   ROTATOR CUFF REPAIR Right 2019   TRANSURETHRAL RESECTION OF BLADDER NECK N/A 09/16/2020   Procedure: TRANSURETHRAL INCISION OF BLADDER NECK CONTRACTURE;  Surgeon: Matilda Senior, MD;  Location: [facility];  Service: Urology;  Laterality: N/A;  45 MINS   TRANSURETHRAL RESECTION OF BLADDER TUMOR WITH MITOMYCIN -C N/A 09/16/2020   Procedure: TRANSURETHRAL RESECTION OF BLADDER TUMOR WITH post op  GEMCITABINE ;  Surgeon: Matilda Senior, MD;  Location:  [facility];  Service: Urology;  Laterality: N/A;   WISDOM TOOTH EXTRACTION         Home Medications    Prior to Admission medications   Medication Sig Start Date End Date Taking? Authorizing Provider  acetaminophen  (TYLENOL ) 500 MG tablet Take 1,000 mg by mouth every 6 (six) hours as needed for moderate pain (pain score 4-6).    [provider]  amLODipine  (NORVASC ) 5 MG tablet Take 5 mg by mouth daily.    [provider]  aspirin  EC 81 MG tablet Take 81 mg by mouth daily. Swallow whole.    [provider]  cetirizine (ZYRTEC) 10 MG tablet Take 10 mg by mouth daily.    [provider]  Cholecalciferol (VITAMIN D ) 50 MCG (2000 UT) tablet Take 2,000 Units by mouth daily.    [provider]  fluticasone  (FLONASE ) 50 MCG/ACT nasal spray USE 2 SPRAYS IN EACH NOSTRIL DAILY 08/18/24   Rollene Almarie LABOR, MD  furosemide  (LASIX ) 20 MG tablet Take 1 tablet (20 mg total) by mouth daily as needed for edema. 02/07/24   Rollene Almarie LABOR, MD  loratadine (CLARITIN) 10 MG tablet Take 10 mg by mouth daily.    [provider]  losartan  (COZAAR ) 100 MG tablet TAKE 1 TABLET DAILY 04/24/24   Rollene Almarie LABOR, MD  lovastatin  (MEVACOR ) 20 MG tablet Take 20 mg by mouth at bedtime.    [provider]  meloxicam (MOBIC) 15 MG tablet Take 15 mg by mouth daily.     [provider]  pantoprazole  (PROTONIX ) 40 MG tablet TAKE 1 TABLET DAILY 10/08/24   Rollene Almarie LABOR, MD  sertraline  (ZOLOFT ) 50 MG tablet Take 50 mg by mouth daily. 1/2 tablet daily - prescribed by the [facility]    [provider]  tamsulosin  (FLOMAX ) 0.4 MG CAPS capsule Take 1 capsule (0.4 mg total) by mouth daily. 11/15/20   Geroldine Berg, MD  terazosin  (HYTRIN ) 10 MG capsule Take 10 mg by mouth at bedtime.     [provider]  triamcinolone  cream (KENALOG ) 0.1 % APPLY TOPICALLY TWICE A DAY 08/18/24   Rollene Almarie LABOR, MD    Family History Family History  Problem Relation Age of Onset   Arthritis Mother    Hyperlipidemia Mother    Heart disease Mother    Stomach cancer Mother    Colon polyps Mother    Arthritis Father    Hyperlipidemia Father    Heart disease Father    Kidney disease Father    Breast cancer Sister    Hypertension Sister    Ovarian cancer Sister    Gallbladder disease Sister        x 6   Colon cancer Neg Hx    Liver cancer Neg Hx    Rectal cancer Neg Hx    Esophageal cancer Neg Hx     Social History Social History   Tobacco Use    Smoking status: Never   Smokeless tobacco: Never  Vaping Use   Vaping status: Never Used  Substance Use Topics   Alcohol use: No    Alcohol/week: 0.0 standard drinks of alcohol   Drug use: No     Allergies   Lisinopril  and Shellfish allergy   Review of Systems Review of Systems  Constitutional:  Positive for chills. Negative for diaphoresis and fever.  Respiratory:  Positive for shortness of breath (started today). Negative for wheezing.   Cardiovascular:  Positive for chest  pain.  Gastrointestinal:  Positive for abdominal pain, diarrhea and nausea. Negative for blood in stool, constipation and vomiting.  Musculoskeletal:  Positive for myalgias.     Physical Exam Triage Vital Signs ED Triage Vitals  Encounter Vitals Group     BP 10/27/24 1353 115/67     Girls Systolic BP Percentile --      Girls Diastolic BP Percentile --      Boys Systolic BP Percentile --      Boys Diastolic BP Percentile --      Pulse Rate 10/27/24 1353 78     Resp 10/27/24 1353 19     Temp 10/27/24 1353 98.5 F (36.9 C)     Temp Source 10/27/24 1353 Oral     SpO2 10/27/24 1353 98 %     Weight 10/27/24 1355 220 lb (99.8 kg)     Height 10/27/24 1355 5' 9 (1.753 m)     Head Circumference --      Peak Flow --      Pain Score 10/27/24 1354 8     Pain Loc --      Pain Education --      Exclude from Growth Chart --    No data found.  Updated Vital Signs BP 115/67 (BP Location: Right Arm)   Pulse 78   Temp 98.5 F (36.9 C) (Oral)   Resp 19   Ht 5' 9 (1.753 m)   Wt 220 lb (99.8 kg)   SpO2 98%   BMI 32.49 kg/m   Visual Acuity Right Eye Distance:   Left Eye Distance:   Bilateral Distance:    Right Eye Near:   Left Eye Near:    Bilateral Near:     Physical Exam Vitals reviewed.  Constitutional:      General: He is awake.     Appearance: Normal appearance. He is well-developed and well-groomed.  HENT:     Head: Normocephalic and atraumatic.  Eyes:     Extraocular Movements:  Extraocular movements intact.     Conjunctiva/sclera: Conjunctivae normal.  Cardiovascular:     Rate and Rhythm: Normal rate and regular rhythm.     Heart sounds: Normal heart sounds. No murmur heard.    No friction rub. No gallop.  Pulmonary:     Effort: Pulmonary effort is normal.     Breath sounds: Normal breath sounds. No decreased air movement. No wheezing, rhonchi or rales.  Abdominal:     General: Abdomen is flat. Bowel sounds are decreased.     Palpations: Abdomen is soft.     Tenderness: There is abdominal tenderness in the right upper quadrant and epigastric area. There is guarding. Positive signs include Murphy's sign.  Musculoskeletal:     Cervical back: Normal range of motion.  Neurological:     Mental Status: He is alert and oriented to person, place, and time.  Psychiatric:        Attention and Perception: Attention normal.        Mood and Affect: Mood normal.        Speech: Speech normal.        Behavior: Behavior normal. Behavior is cooperative.      UC Treatments / Results  Labs (all labs ordered are listed, but only abnormal results are displayed) Labs Reviewed - No data to display  EKG   Radiology No results found.  Procedures ED EKG  Date/Time: 10/27/2024 6:44 PM  Performed by: Rilen Shukla E, PA-C Authorized by: Marylene Longs  E, PA-C   Previous ECG:    Previous ECG:  Compared to current   Similarity:  No change   Comparison ECG info:  01/14/24 Interpretation:    Interpretation: normal   Rate:    ECG rate:  71   ECG rate assessment: normal   Rhythm:    Rhythm: sinus rhythm   ST segments:    ST segments:  Normal T waves:    T waves: normal    (including critical care time)  Medications Ordered in UC Medications  ondansetron  (ZOFRAN -ODT) disintegrating tablet 4 mg (4 mg Oral Given 10/27/24 1431)    Initial Impression / Assessment and Plan / UC Course  I have reviewed the triage vital signs and the nursing notes.  Pertinent labs &  imaging results that were available during my care of the patient were reviewed by me and considered in my medical decision making (see chart for details).      Final Clinical Impressions(s) / UC Diagnoses   Final diagnoses:  RUQ pain  Nausea without vomiting  Chest pain, unspecified type   Patient presents today with concerns for right upper quadrant abdominal pain, chest pain and tightness, bloating sensation and diarrhea.  He reports that his symptoms have been ongoing since Friday night and have not responded to Mylanta, Pepto-Bismol, Tylenol .  Physical exam is notable for right upper quadrant pain and positive Murphy sign.  EKG is largely reassuring without obvious evidence of ST elevation or depression or T wave inversion.  Based on patient's presentation and symptoms I am concerned for potential gallbladder etiology but cannot rule out gastroenteritis, NSTEMI, pancreatitis, GERD.  With patient's symptoms and lack of improvement with OTC medications I am concerned for more severe cause and recommend prompt evaluation in the emergency room with advanced imaging and appropriate lab work.  Will provide patient with Zofran  4 mg ODT here in clinic to assist with nausea.  Patient was provided with addresses for nearby ERs and advised to call 911 if he cannot make it to the ER on his own.  Patient declines EMS transportation and states he will go via private vehicle.    Discharge Instructions      You were seen today for concerns of chest pain, bloating, right upper quadrant abdominal pain.  Based on the constellation of your symptoms I am concerned that you may be having a problem with your gallbladder and I recommend advanced imaging to be completed at the emergency room or an nearby med center per your preference.  We have supplied you with a dose of Zofran  4 mg to assist with your nausea while you are en route to the emergency room.  You have declined EMS transportation and decided to go via  private vehicle.  If at any point you feel you cannot make it to the emergency room please call 911 for assistance     ED Prescriptions   None    PDMP not reviewed this encounter.   Marylene Rocky BRAVO, PA-C 10/27/24 8150

## 2024-10-28 DIAGNOSIS — Z886 Allergy status to analgesic agent status: Secondary | ICD-10-CM | POA: Diagnosis not present

## 2024-10-28 DIAGNOSIS — R509 Fever, unspecified: Secondary | ICD-10-CM | POA: Diagnosis present

## 2024-10-28 DIAGNOSIS — Z800 Family history of malignant neoplasm of digestive organs: Secondary | ICD-10-CM | POA: Diagnosis not present

## 2024-10-28 DIAGNOSIS — Z860100 Personal history of colon polyps, unspecified: Secondary | ICD-10-CM | POA: Diagnosis not present

## 2024-10-28 DIAGNOSIS — Z888 Allergy status to other drugs, medicaments and biological substances status: Secondary | ICD-10-CM | POA: Diagnosis not present

## 2024-10-28 DIAGNOSIS — Z91013 Allergy to seafood: Secondary | ICD-10-CM | POA: Diagnosis not present

## 2024-10-28 DIAGNOSIS — K219 Gastro-esophageal reflux disease without esophagitis: Secondary | ICD-10-CM | POA: Diagnosis present

## 2024-10-28 DIAGNOSIS — Z8551 Personal history of malignant neoplasm of bladder: Secondary | ICD-10-CM | POA: Diagnosis not present

## 2024-10-28 DIAGNOSIS — E66811 Obesity, class 1: Secondary | ICD-10-CM | POA: Diagnosis present

## 2024-10-28 DIAGNOSIS — Z1152 Encounter for screening for COVID-19: Secondary | ICD-10-CM | POA: Diagnosis not present

## 2024-10-28 DIAGNOSIS — Z8419 Family history of other disorders of kidney and ureter: Secondary | ICD-10-CM | POA: Diagnosis not present

## 2024-10-28 DIAGNOSIS — Z8249 Family history of ischemic heart disease and other diseases of the circulatory system: Secondary | ICD-10-CM | POA: Diagnosis not present

## 2024-10-28 DIAGNOSIS — F32A Depression, unspecified: Secondary | ICD-10-CM | POA: Diagnosis present

## 2024-10-28 DIAGNOSIS — E785 Hyperlipidemia, unspecified: Secondary | ICD-10-CM | POA: Diagnosis present

## 2024-10-28 DIAGNOSIS — K859 Acute pancreatitis without necrosis or infection, unspecified: Secondary | ICD-10-CM | POA: Diagnosis present

## 2024-10-28 DIAGNOSIS — Z8261 Family history of arthritis: Secondary | ICD-10-CM | POA: Diagnosis not present

## 2024-10-28 DIAGNOSIS — Z79899 Other long term (current) drug therapy: Secondary | ICD-10-CM | POA: Diagnosis not present

## 2024-10-28 DIAGNOSIS — K8500 Idiopathic acute pancreatitis without necrosis or infection: Secondary | ICD-10-CM

## 2024-10-28 DIAGNOSIS — Z7982 Long term (current) use of aspirin: Secondary | ICD-10-CM | POA: Diagnosis not present

## 2024-10-28 DIAGNOSIS — Z981 Arthrodesis status: Secondary | ICD-10-CM | POA: Diagnosis not present

## 2024-10-28 DIAGNOSIS — Z83438 Family history of other disorder of lipoprotein metabolism and other lipidemia: Secondary | ICD-10-CM | POA: Diagnosis not present

## 2024-10-28 DIAGNOSIS — Z83719 Family history of colon polyps, unspecified: Secondary | ICD-10-CM | POA: Diagnosis not present

## 2024-10-28 DIAGNOSIS — I10 Essential (primary) hypertension: Secondary | ICD-10-CM | POA: Diagnosis present

## 2024-10-28 DIAGNOSIS — Z6832 Body mass index (BMI) 32.0-32.9, adult: Secondary | ICD-10-CM | POA: Diagnosis not present

## 2024-10-28 DIAGNOSIS — E213 Hyperparathyroidism, unspecified: Secondary | ICD-10-CM | POA: Diagnosis present

## 2024-10-28 LAB — C-REACTIVE PROTEIN: CRP: 19.8 mg/dL — ABNORMAL HIGH (ref <1.0)

## 2024-10-28 LAB — RESP PANEL BY RT-PCR (RSV, FLU A&B, COVID)  RVPGX2
Influenza A by PCR: NEGATIVE
Influenza B by PCR: NEGATIVE
Resp Syncytial Virus by PCR: NEGATIVE
SARS Coronavirus 2 by RT PCR: NEGATIVE

## 2024-10-28 LAB — TRIGLYCERIDES: Triglycerides: 35 mg/dL (ref <150)

## 2024-10-28 MED ORDER — HEPARIN SODIUM (PORCINE) 5000 UNIT/ML IJ SOLN
5000.0000 [IU] | Freq: Three times a day (TID) | INTRAMUSCULAR | Status: DC
  Administered 2024-10-28 – 2024-10-29 (×3): 5000 [IU] via SUBCUTANEOUS
  Filled 2024-10-28 (×3): qty 1

## 2024-10-28 MED ORDER — TRAZODONE HCL 50 MG PO TABS: 25.0000 mg | ORAL_TABLET | Freq: Every evening | ORAL | Status: DC | PRN

## 2024-10-28 MED ORDER — AMLODIPINE BESYLATE 5 MG PO TABS
5.0000 mg | ORAL_TABLET | Freq: Every day | ORAL | Status: DC
  Administered 2024-10-28 – 2024-10-29 (×2): 5 mg via ORAL
  Filled 2024-10-28 (×2): qty 1

## 2024-10-28 MED ORDER — PANTOPRAZOLE SODIUM 40 MG PO TBEC
40.0000 mg | DELAYED_RELEASE_TABLET | Freq: Every day | ORAL | Status: DC
  Administered 2024-10-28 – 2024-10-29 (×2): 40 mg via ORAL
  Filled 2024-10-28 (×2): qty 1

## 2024-10-28 MED ORDER — LACTATED RINGERS IV SOLN: INTRAVENOUS | Status: AC

## 2024-10-28 MED ORDER — ACETAMINOPHEN 650 MG RE SUPP: 650.0000 mg | Freq: Four times a day (QID) | RECTAL | Status: DC | PRN

## 2024-10-28 MED ORDER — ALBUTEROL SULFATE (2.5 MG/3ML) 0.083% IN NEBU: 2.5000 mg | INHALATION_SOLUTION | RESPIRATORY_TRACT | Status: DC | PRN

## 2024-10-28 MED ORDER — ACETAMINOPHEN 325 MG PO TABS
650.0000 mg | ORAL_TABLET | Freq: Four times a day (QID) | ORAL | Status: DC | PRN
  Administered 2024-10-28 (×2): 650 mg via ORAL
  Filled 2024-10-28 (×2): qty 2

## 2024-10-28 MED ORDER — PRAVASTATIN SODIUM 20 MG PO TABS
10.0000 mg | ORAL_TABLET | Freq: Every day | ORAL | Status: DC
  Administered 2024-10-28: 10 mg via ORAL
  Filled 2024-10-28: qty 1

## 2024-10-28 MED ORDER — FLUTICASONE PROPIONATE 50 MCG/ACT NA SUSP
2.0000 | Freq: Every day | NASAL | Status: DC
  Administered 2024-10-28: 2 via NASAL
  Filled 2024-10-28: qty 16

## 2024-10-28 MED ORDER — TAMSULOSIN HCL 0.4 MG PO CAPS
0.4000 mg | ORAL_CAPSULE | Freq: Every day | ORAL | Status: DC
  Administered 2024-10-28 – 2024-10-29 (×2): 0.4 mg via ORAL
  Filled 2024-10-28 (×2): qty 1

## 2024-10-28 MED ORDER — ASPIRIN 81 MG PO TBEC
81.0000 mg | DELAYED_RELEASE_TABLET | Freq: Every day | ORAL | Status: DC
  Administered 2024-10-28 – 2024-10-29 (×2): 81 mg via ORAL
  Filled 2024-10-28 (×2): qty 1

## 2024-10-28 NOTE — H&P (Addendum)
 History and Physical  Hector [name] FMW:[id number] DOB: November 12, 1951 DOA: 10/27/2024  PCP: Rollene Almarie LABOR, MD   Chief Complaint: Abdominal pain  HPI: Hector [name] is a 73 y.o. male with medical history significant for GERD, hyperlipidemia, hypertension presents with 4 days of epigastric abdominal pain found to have acute pancreatitis.  Patient states he was in his usual state of health until about 4 days ago, when he started noticing some burning epigastric abdominal pain after having dinner.  This persisted, with some radiation into the right upper quadrant of the abdomen.  There was some mild associated nausea, but no vomiting.  The day after the pain started, he had 1 episode of diarrhea.  No melena or blood in the stool.  Patient also started having some fevers at home yesterday.  He denies any cough, rashes on the skin, sore throat, sick contacts, out-of-state travel, smoking or significant alcohol consumption.  He denies any history of weight loss, dizziness.  He denies any changes to his medications in the last 12 months.  Review of Systems: Please see HPI for pertinent positives and negatives. A complete 10 system review of systems are otherwise negative.  Past Medical History:  Diagnosis Date   Adhesive capsulitis 07/31/2018   Allergy    Arthritis    Bladder cancer (HCC)    Chronic kidney disease    kidney stones   GERD (gastroesophageal reflux disease)    History of kidney stones    Hyperlipidemia    Hyperparathyroidism    Hypertension    Past Surgical History:  Procedure Laterality Date   COLONOSCOPY     CYSTOSCOPY N/A 09/16/2020   Procedure: CYSTOSCOPY;  Surgeon: Matilda Senior, MD;  Location: [facility];  Service: Urology;  Laterality: N/A;   PARATHYROIDECTOMY Right 09/20/2021   Procedure: RIGHT INFERIOR PARATHYROIDECTOMY;  Surgeon: Eletha Boas, MD;  Location: [facility];  Service: General;  Laterality: Right;  77   POLYPECTOMY      POSTERIOR FUSION PEDICLE SCREW PLACEMENT N/A 01/23/2024   Procedure: Lumbar Four-Five Posterior Lateral and Interbody fusion;  Surgeon: Joshua Alm Hamilton, MD;  Location: [facility] OR;  Service: Neurosurgery;  Laterality: N/A;   PROSTATE SURGERY  12/2012   no cancer   ROTATOR CUFF REPAIR Right 2019   TRANSURETHRAL RESECTION OF BLADDER NECK N/A 09/16/2020   Procedure: TRANSURETHRAL INCISION OF BLADDER NECK CONTRACTURE;  Surgeon: Matilda Senior, MD;  Location: [facility];  Service: Urology;  Laterality: N/A;  45 MINS   TRANSURETHRAL RESECTION OF BLADDER TUMOR WITH MITOMYCIN -C N/A 09/16/2020   Procedure: TRANSURETHRAL RESECTION OF BLADDER TUMOR WITH post op  GEMCITABINE ;  Surgeon: Matilda Senior, MD;  Location: [facility];  Service: Urology;  Laterality: N/A;   WISDOM TOOTH EXTRACTION     Social History:  reports that he has never smoked. He has never used smokeless tobacco. He reports that he does not drink alcohol and does not use drugs.  Allergies  Allergen Reactions   Lisinopril  Cough   Shellfish Allergy Hives    Family History  Problem Relation Age of Onset   Arthritis Mother    Hyperlipidemia Mother    Heart disease Mother    Stomach cancer Mother    Colon polyps Mother    Arthritis Father    Hyperlipidemia Father    Heart disease Father    Kidney disease Father    Breast cancer Sister    Hypertension Sister    Ovarian cancer Sister  Gallbladder disease Sister        x 6   Colon cancer Neg Hx    Liver cancer Neg Hx    Rectal cancer Neg Hx    Esophageal cancer Neg Hx      Prior to Admission medications   Medication Sig Start Date End Date Taking? Authorizing Provider  acetaminophen  (TYLENOL ) 500 MG tablet Take 1,000 mg by mouth every 6 (six) hours as needed for moderate pain (pain score 4-6).    [provider]  amLODipine  (NORVASC ) 5 MG tablet Take 5 mg by mouth daily.    [provider]  aspirin  EC 81 MG tablet Take  81 mg by mouth daily. Swallow whole.    [provider]  cetirizine (ZYRTEC) 10 MG tablet Take 10 mg by mouth daily.    [provider]  Cholecalciferol (VITAMIN D ) 50 MCG (2000 UT) tablet Take 2,000 Units by mouth daily.    [provider]  fluticasone  (FLONASE ) 50 MCG/ACT nasal spray USE 2 SPRAYS IN EACH NOSTRIL DAILY 08/18/24   Rollene Almarie LABOR, MD  furosemide  (LASIX ) 20 MG tablet Take 1 tablet (20 mg total) by mouth daily as needed for edema. 02/07/24   Rollene Almarie LABOR, MD  loratadine (CLARITIN) 10 MG tablet Take 10 mg by mouth daily.    [provider]  losartan  (COZAAR ) 100 MG tablet TAKE 1 TABLET DAILY 04/24/24   Rollene Almarie LABOR, MD  lovastatin  (MEVACOR ) 20 MG tablet Take 20 mg by mouth at bedtime.    [provider]  meloxicam (MOBIC) 15 MG tablet Take 15 mg by mouth daily.     [provider]  pantoprazole  (PROTONIX ) 40 MG tablet TAKE 1 TABLET DAILY 10/08/24   Rollene Almarie LABOR, MD  sertraline  (ZOLOFT ) 50 MG tablet Take 50 mg by mouth daily. 1/2 tablet daily - prescribed by the [facility]    [provider]  tamsulosin  (FLOMAX ) 0.4 MG CAPS capsule Take 1 capsule (0.4 mg total) by mouth daily. 11/15/20   Geroldine Berg, MD  terazosin  (HYTRIN ) 10 MG capsule Take 10 mg by mouth at bedtime.     [provider]  triamcinolone  cream (KENALOG ) 0.1 % APPLY TOPICALLY TWICE A DAY 08/18/24   Rollene Almarie LABOR, MD    Physical Exam: BP (!) 140/76 (BP Location: Left Arm)   Pulse 69   Temp (!) 100.4 F (38 C) (Oral)   Resp 16   SpO2 97%  General:  Alert, oriented, calm, in no acute distress, resting comfortably on room air Eyes: EOMI, clear conjuctivae, white sclerea Neck: supple, no masses, trachea mildline  Cardiovascular: RRR, no murmurs or rubs, no peripheral edema  Respiratory: clear to auscultation bilaterally, no wheezes, no crackles  Abdomen: soft, minimally tender especially in the epigastrium,  nondistended, normal bowel tones heard  Skin: dry, no rashes  Musculoskeletal: no joint effusions, normal range of motion  Psychiatric: appropriate affect, normal speech  Neurologic: extraocular muscles intact, clear speech, moving all extremities with intact sensorium         Labs on Admission:  Basic Metabolic Panel: Recent Labs  Lab 10/27/24 1610  NA 140  K 3.6  CL 102  CO2 25  GLUCOSE 104*  BUN 19  CREATININE 1.42*  CALCIUM 9.3   Liver Function Tests: Recent Labs  Lab 10/27/24 1610  AST 21  ALT 12  ALKPHOS 82  BILITOT 0.9  PROT 7.5  ALBUMIN  4.3   Recent Labs  Lab 10/27/24 1610  LIPASE 75*   No results for input(s): AMMONIA in the last 168 hours. CBC: Recent Labs  Lab 10/27/24 1610  WBC 6.9  HGB 13.4  HCT 41.0  MCV 84.7  PLT 170   Cardiac Enzymes: No results for input(s): CKTOTAL, CKMB, CKMBINDEX, TROPONINI in the last 168 hours. BNP (last 3 results) No results for input(s): BNP in the last 8760 hours.  ProBNP (last 3 results) No results for input(s): PROBNP in the last 8760 hours.  CBG: No results for input(s): GLUCAP in the last 168 hours.  Radiological Exams on Admission: DG Chest 2 View Result Date: 10/27/2024 EXAM: 2 VIEW(S) XRAY OF THE CHEST 10/27/2024 09:34:00 PM COMPARISON: None available. CLINICAL HISTORY: Cough, CP (chest pain). FINDINGS: LUNGS AND PLEURA: Mild left basilar atelectasis is noted. No pleural effusion. No pneumothorax. HEART AND MEDIASTINUM: No acute abnormality of the cardiac and mediastinal silhouettes. BONES AND SOFT TISSUES: No acute osseous abnormality. Visualized upper abdomen is within normal limits. IMPRESSION: 1. Mild left basilar atelectasis. Electronically signed by: Oneil Devonshire MD 10/27/2024 09:39 PM EST RP Workstation: [id number]   CT ABDOMEN PELVIS W CONTRAST Result Date: 10/27/2024 EXAM: CT ABDOMEN AND PELVIS WITH CONTRAST 10/27/2024 08:53:30 PM TECHNIQUE: CT of the abdomen and pelvis was  performed with the administration of intravenous contrast. Multiplanar reformatted images are provided for review. Automated exposure control, iterative reconstruction, and/or weight-based adjustment of the mA/kV was utilized to reduce the radiation dose to as low as reasonably achievable. COMPARISON: 12/03/2020 CLINICAL HISTORY: Chest and abdominal pain for several days. FINDINGS: LOWER CHEST: Lung bases are free of acute infiltrate or sizable effusion. LIVER: The liver is within normal limits. GALLBLADDER AND BILE DUCTS: The gallbladder is within normal limits. No biliary ductal dilatation. SPLEEN: The spleen is unremarkable. PANCREAS: Pancreas shows some mild peripancreatic inflammatory change in the head and uncinate process consistent with focal acute pancreatitis. The body and tail are within normal limits. ADRENAL GLANDS: The adrenal glands are unremarkable. KIDNEYS, URETERS AND BLADDER: The kidneys show a normal enhancement pattern. Punctate nonobstructing left renal stone is seen. The bladder is partially distended. GI AND BOWEL: Stomach is unremarkable. Small bowel is unremarkable. No obstructive or inflammatory changes of the colon are seen. Minimal diverticular changes of the colon are noted without diverticulitis. The appendix is within normal limits. PERITONEUM AND RETROPERITONEUM: No ascites. No free air. VASCULATURE: The aorta is within normal limits. LYMPH NODES: No lymphadenopathy. REPRODUCTIVE ORGANS: The prostate is unremarkable. BONES AND SOFT TISSUES: No acute osseous abnormality is seen. A small fat-containing umbilical hernia is noted. IMPRESSION: 1. Focal acute pancreatitis involving the pancreatic head and uncinate process. Electronically signed by: Oneil Devonshire MD 10/27/2024 08:59 PM EST RP Workstation: [id number]   Assessment/Plan Hector [name] is a 73 y.o. male with medical history significant for GERD, hyperlipidemia, hypertension presents with 4 days of epigastric abdominal pain  found to have acute pancreatitis.   Acute pancreatitis-with abdominal pain, elevated lipase, CT changes as above with focal acute pancreatitis.  Etiology is unclear, triglycerides normal, patient does not smoke and does not drink alcohol in any significant amount.  CT without evidence of biliary ductal dilation, rest of his LFTs including AST, ALT, bilirubin, alk phos are normal.  Patient also has a low-grade fever, which raises the possibility of autoimmune pancreatitis.  Could also be medication related or otherwise idiopathic pancreatitis.  He is also on multiple medications which could technically cause pancreatitis. -Inpatient admission -Keep n.p.o. -Pain and nausea medication as needed -Continue LR infusion  at 125 cc/h -Will ask pharmacy to review home medications for causative agents -Will plan to advance diet as tolerated overnight  Fever-unclear etiology, patient without any focalizing infectious symptoms. -Follow-up blood culture -Follow-up urine culture -Monitor off antibiotics for the time being  Hypertension-resume home amlodipine , losartan , etc. once medication reviews complete  Hyperlipidemia-continue home statin  GERD-Protonix   Depression-Zoloft   DVT prophylaxis: Subcutaneous heparin     Code Status: Full Code  Consults called: None  Admission status: The appropriate patient status for this patient is INPATIENT. Inpatient status is judged to be reasonable and necessary in order to provide the required intensity of service to ensure the patient's safety. The patient's presenting symptoms, physical exam findings, and initial radiographic and laboratory data in the context of their chronic comorbidities is felt to place them at high risk for further clinical deterioration. Furthermore, it is not anticipated that the patient will be medically stable for discharge from the hospital within 2 midnights of admission.    I certify that at the point of admission it is my clinical  judgment that the patient will require inpatient hospital care spanning beyond 2 midnights from the point of admission due to high intensity of service, high risk for further deterioration and high frequency of surveillance required  Time spent: 53 minutes  Tyquan Carmickle CHRISTELLA Gail MD [facility] Pager [phone]  If 7PM-7AM, please contact night-coverage [email or web address] Password [email or web address]  10/28/2024, 9:02 AM

## 2024-10-28 NOTE — Plan of Care (Signed)
  Problem: Clinical Measurements: Goal: Ability to maintain clinical measurements within normal limits will improve Outcome: Progressing Goal: Diagnostic test results will improve Outcome: Progressing Goal: Respiratory complications will improve Outcome: Progressing Goal: Cardiovascular complication will be avoided Outcome: Progressing   Problem: Pain Managment: Goal: General experience of comfort will improve and/or be controlled Outcome: Progressing

## 2024-10-28 NOTE — ED Notes (Signed)
 [facility] here to transfer to [facility] for admission.

## 2024-10-28 NOTE — TOC Initial Note (Signed)
 Transition of Care [facility]) - Initial/Assessment Note    Patient Details  Name: Hector [name] MRN: [id number] Date of Birth: [birth year]  Transition of Care [facility]) CM/SW Contact:    Bascom Service, RN Phone Number: 10/28/2024, 9:56 AM  Clinical Narrative: d/c plan home. Has own transport.                  Expected Discharge Plan: Home/Self Care Barriers to Discharge: Continued Medical Work up   Patient Goals and CMS Choice   CMS Medicare.gov Compare Post Acute Care list provided to:: Patient Choice offered to / list presented to : Patient Hector [name] ownership interest in [facility].provided to:: Patient    Expected Discharge Plan and Services   Discharge Planning Services: CM Consult   Living arrangements for the past 2 months: Single Family Home                                      Prior Living Arrangements/Services Living arrangements for the past 2 months: Single Family Home Lives with:: Spouse              Current home services: DME (rw)    Activities of Daily Living   ADL Screening (condition at time of admission) Independently performs ADLs?: Yes (appropriate for developmental age) Is the patient deaf or have difficulty hearing?: No Does the patient have difficulty seeing, even when wearing glasses/contacts?: No Does the patient have difficulty concentrating, remembering, or making decisions?: No  Permission Sought/Granted                  Emotional Assessment              Admission diagnosis:  Acute pancreatitis [K85.90] Acute pancreatitis without infection or necrosis, unspecified pancreatitis type [K85.90] Patient Active Problem List   Diagnosis Date Noted   Acute pancreatitis 10/28/2024   Left ankle swelling 02/07/2024   S/P lumbar fusion 01/23/2024   Jaw pain 05/07/2023   Left foot pain 06/30/2022   Numbness and tingling of both legs 07/25/2021   Chronic left shoulder pain 11/12/2020   Chronic bilateral low back  pain with left-sided sciatica 03/24/2016   Hyperparathyroidism 04/13/2015   Vitamin D  deficiency 04/09/2015   Osteoarthritis 11/06/2014   Essential hypertension 11/06/2014   Hyperlipidemia 11/06/2014   GERD (gastroesophageal reflux disease) 11/06/2014   Syncope 11/06/2014   Routine general medical examination at a health care facility 11/06/2014   PCP:  Hector Almarie LABOR, MD Pharmacy:   [id number] [place], [place] - [address] AT [facility] & [address] [place] [address] Phone: [phone] Fax: [phone]     Social Drivers of Health (SDOH) Social History: SDOH Screenings   Food Insecurity: No Food Insecurity (10/28/2024)  Housing: Low Risk  (10/28/2024)  Transportation Needs: No Transportation Needs (10/28/2024)  Utilities: Not At Risk (10/28/2024)  Alcohol Screen: Low Risk  (01/16/2024)  Depression (PHQ2-9): Low Risk  (01/16/2024)  Financial Resource Strain: Low Risk  (08/01/2024)  Physical Activity: Sufficiently Active (08/01/2024)  Social Connections: Socially Integrated (10/28/2024)  Stress: No Stress Concern Present (08/01/2024)  Tobacco Use: Low Risk  (10/27/2024)  Health Literacy: Adequate Health Literacy (01/16/2024)   SDOH Interventions:     Readmission Risk Interventions     No data to display

## 2024-10-29 LAB — BASIC METABOLIC PANEL WITH GFR
Anion gap: 9 (ref 5–15)
BUN: 14 mg/dL (ref 8–23)
CO2: 27 mmol/L (ref 22–32)
Calcium: 8.5 mg/dL — ABNORMAL LOW (ref 8.9–10.3)
Chloride: 103 mmol/L (ref 98–111)
Creatinine, Ser: 1.13 mg/dL (ref 0.61–1.24)
GFR, Estimated: >60 mL/min (ref >60)
Glucose, Bld: 81 mg/dL (ref 70–99)
Potassium: 3.4 mmol/L — ABNORMAL LOW (ref 3.5–5.1)
Sodium: 139 mmol/L (ref 135–145)

## 2024-10-29 LAB — CBC
HCT: 37.8 % — ABNORMAL LOW (ref 39.0–52.0)
Hemoglobin: 12.3 g/dL — ABNORMAL LOW (ref 13.0–17.0)
MCH: 28 pg (ref 26.0–34.0)
MCHC: 32.5 g/dL (ref 30.0–36.0)
MCV: 85.9 fL (ref 80.0–100.0)
Platelets: 151 K/uL (ref 150–400)
RBC: 4.4 MIL/uL (ref 4.22–5.81)
RDW: 14.1 % (ref 11.5–15.5)
WBC: 7.1 K/uL (ref 4.0–10.5)
nRBC: 0 % (ref 0.0–0.2)

## 2024-10-29 LAB — IGG: IgG (Immunoglobin G), Serum: 1123 mg/dL (ref 603–1613)

## 2024-10-29 LAB — LIPASE, BLOOD: Lipase: 25 U/L (ref 11–51)

## 2024-10-29 MED ORDER — LACTATED RINGERS IV SOLN: INTRAVENOUS | Status: DC

## 2024-10-29 MED ORDER — POTASSIUM CHLORIDE CRYS ER 20 MEQ PO TBCR
40.0000 meq | EXTENDED_RELEASE_TABLET | Freq: Once | ORAL | Status: AC
  Administered 2024-10-29: 40 meq via ORAL
  Filled 2024-10-29: qty 2

## 2024-10-29 NOTE — Discharge Summary (Signed)
 Physician Discharge Summary  Hector [name] FMW:[id number] DOB: October 15, 1951 DOA: 10/27/2024  PCP: Rollene Almarie LABOR, MD  Admit date: 10/27/2024 Discharge date: 10/29/2024  Admitted From: Home Disposition: Home  Recommendations for Outpatient Follow-up:  Follow up with PCP in 1 week with repeat CBC/CMP Outpatient evaluation and follow-up by GI Follow up in ED if symptoms worsen or new appear   Home Health: No Equipment/Devices: None  Discharge Condition: Stable CODE STATUS: Full Diet recommendation: Heart healthy/soft diet  Brief/Interim Summary: 73 y.o. male with medical history significant for GERD, hyperlipidemia, hypertension presented with worsening abdominal pain and was admitted for acute pancreatitis.  On presentation, lipase was only mildly elevated at 75; CT of abdomen and pelvis with contrast showed acute pancreatitis involving the pancreatic head and uncinate process.  He was started on IV fluids and analgesics.  Subsequently, his condition has improved.  He was cooperative.  His diet will be advanced.  He will be discharged home today if he tolerates diet.  Discharge Diagnoses:   Acute pancreatitis - Imaging as above.  Etiology unclear.  Triglycerides normal.  Patient does not have history of alcohol abuse.  LFTs are normal. - Treated with IV fluids and analgesics and NPO.  Feels much better and wants to go home today.  Will advance diet. - He will be discharged home today if he tolerates diet.  Would benefit from outpatient evaluation and follow-up by GI  Fever - Unclear etiology.  Resolved.  Was monitored off antibiotics  Hypertension - Blood pressure intermittently on the lower side.  Continue amlodipine .  Losartan  remain on hold till reevaluation with PCP  Hyperlipidemia -Continue statin  GERD Continue PPI  Obesity class I - Outpatient follow-up  Discharge Instructions  Discharge Instructions     Ambulatory referral to Gastroenterology    Complete by: As directed    Hospital follow-up for acute pancreatitis   What is the reason for referral?: Other   Diet - low sodium heart healthy   Complete by: As directed    Soft diet   Increase activity slowly   Complete by: As directed       Allergies as of 10/29/2024       Reactions   Nsaids Other (See Comments)   Contraindication due to CKD    Shellfish Allergy Hives   Lisinopril  Cough        Medication List     STOP taking these medications    losartan  100 MG tablet Commonly known as: COZAAR    meloxicam 15 MG tablet Commonly known as: MOBIC   tamsulosin  0.4 MG Caps capsule Commonly known as: FLOMAX        TAKE these medications    acetaminophen  500 MG tablet Commonly known as: TYLENOL  Take 1,000 mg by mouth every 6 (six) hours as needed for moderate pain (pain score 4-6).   amLODipine  5 MG tablet Commonly known as: NORVASC  Take 5 mg by mouth daily.   aspirin  EC 81 MG tablet Take 81 mg by mouth daily. Swallow whole.   cetirizine 10 MG tablet Commonly known as: ZYRTEC Take 10 mg by mouth daily.   fluticasone  50 MCG/ACT nasal spray Commonly known as: FLONASE  USE 2 SPRAYS IN EACH NOSTRIL DAILY   furosemide  20 MG tablet Commonly known as: Lasix  Take 1 tablet (20 mg total) by mouth daily as needed for edema.   loratadine 10 MG tablet Commonly known as: CLARITIN Take 10 mg by mouth daily.   lovastatin  20 MG tablet Commonly known as: MEVACOR   Take 20 mg by mouth at bedtime.   pantoprazole  40 MG tablet Commonly known as: PROTONIX  TAKE 1 TABLET DAILY   tadalafil 20 MG tablet Commonly known as: CIALIS Take 20 mg by mouth daily as needed for erectile dysfunction.   terazosin  10 MG capsule Commonly known as: HYTRIN  Take 10 mg by mouth at bedtime.   triamcinolone  cream 0.1 % Commonly known as: KENALOG  APPLY TOPICALLY TWICE A DAY   Vitamin D  50 MCG (2000 UT) tablet Take 2,000 Units by mouth daily.        Follow-up Information      Rollene Almarie LABOR, MD. Schedule an appointment as soon as possible for a visit in 1 week(s).   Specialty: Internal Medicine Contact information: [address] [place] [address] [phone]                Allergies  Allergen Reactions   Nsaids Other (See Comments)    Contraindication due to CKD    Shellfish Allergy Hives   Lisinopril  Cough    Consultations: None   Procedures/Studies: DG Chest 2 View Result Date: 10/27/2024 EXAM: 2 VIEW(S) XRAY OF THE CHEST 10/27/2024 09:34:00 PM COMPARISON: None available. CLINICAL HISTORY: Cough, CP (chest pain). FINDINGS: LUNGS AND PLEURA: Mild left basilar atelectasis is noted. No pleural effusion. No pneumothorax. HEART AND MEDIASTINUM: No acute abnormality of the cardiac and mediastinal silhouettes. BONES AND SOFT TISSUES: No acute osseous abnormality. Visualized upper abdomen is within normal limits. IMPRESSION: 1. Mild left basilar atelectasis. Electronically signed by: Oneil Devonshire MD 10/27/2024 09:39 PM EST RP Workstation: [id number]   CT ABDOMEN PELVIS W CONTRAST Result Date: 10/27/2024 EXAM: CT ABDOMEN AND PELVIS WITH CONTRAST 10/27/2024 08:53:30 PM TECHNIQUE: CT of the abdomen and pelvis was performed with the administration of intravenous contrast. Multiplanar reformatted images are provided for review. Automated exposure control, iterative reconstruction, and/or weight-based adjustment of the mA/kV was utilized to reduce the radiation dose to as low as reasonably achievable. COMPARISON: 12/03/2020 CLINICAL HISTORY: Chest and abdominal pain for several days. FINDINGS: LOWER CHEST: Lung bases are free of acute infiltrate or sizable effusion. LIVER: The liver is within normal limits. GALLBLADDER AND BILE DUCTS: The gallbladder is within normal limits. No biliary ductal dilatation. SPLEEN: The spleen is unremarkable. PANCREAS: Pancreas shows some mild peripancreatic inflammatory change in the head and uncinate process  consistent with focal acute pancreatitis. The body and tail are within normal limits. ADRENAL GLANDS: The adrenal glands are unremarkable. KIDNEYS, URETERS AND BLADDER: The kidneys show a normal enhancement pattern. Punctate nonobstructing left renal stone is seen. The bladder is partially distended. GI AND BOWEL: Stomach is unremarkable. Small bowel is unremarkable. No obstructive or inflammatory changes of the colon are seen. Minimal diverticular changes of the colon are noted without diverticulitis. The appendix is within normal limits. PERITONEUM AND RETROPERITONEUM: No ascites. No free air. VASCULATURE: The aorta is within normal limits. LYMPH NODES: No lymphadenopathy. REPRODUCTIVE ORGANS: The prostate is unremarkable. BONES AND SOFT TISSUES: No acute osseous abnormality is seen. A small fat-containing umbilical hernia is noted. IMPRESSION: 1. Focal acute pancreatitis involving the pancreatic head and uncinate process. Electronically signed by: Oneil Devonshire MD 10/27/2024 08:59 PM EST RP Workstation: [id number]      Subjective: Patient seen and examined at bedside.  Abdominal pain has resolved.  No more fever since admission.  Wants to go home today.  Denies shortness of breath or chest pain.  Discharge Exam: Vitals:   10/28/24 2200 10/29/24 0435  BP:  129/76  Pulse:  (!) 58  Resp:  16  Temp: 99.1 F (37.3 C) 98.9 F (37.2 C)  SpO2:  95%    General: Pt is alert, awake, not in acute distress.  On room air. Cardiovascular: rate controlled, S1/S2 + Respiratory: bilateral decreased breath sounds at bases Abdominal: Soft, obese, NT, ND, bowel sounds + Extremities: no edema, no cyanosis    The results of significant diagnostics from this hospitalization (including imaging, microbiology, ancillary and laboratory) are listed below for reference.     Microbiology: Recent Results (from the past 240 hours)  Culture, blood (single)     Status: None (Preliminary result)   Collection Time:  10/27/24  9:03 PM   Specimen: BLOOD RIGHT HAND  Result Value Ref Range Status   Specimen Description   Final    BLOOD RIGHT HAND Performed at [facility], [address], [place], [place] [address]    Special Requests   Final    BOTTLES DRAWN AEROBIC AND ANAEROBIC Blood Culture adequate volume Performed at [facility], [address] [place], [place] [address]    Culture   Final    NO GROWTH < 24 HOURS Performed at [facility], [address] [place], [place] [address]    Report Status PENDING  Incomplete  Resp panel by RT-PCR (RSV, Flu A&B, Covid) Anterior Nasal Swab     Status: None   Collection Time: 10/28/24 12:36 AM   Specimen: Anterior Nasal Swab  Result Value Ref Range Status   SARS Coronavirus 2 by RT PCR NEGATIVE NEGATIVE Final    Comment: (NOTE) SARS-CoV-2 target nucleic acids are NOT DETECTED.  The SARS-CoV-2 RNA is generally detectable in upper respiratory specimens during the acute phase of infection. The lowest concentration of SARS-CoV-2 viral copies this assay can detect is 138 copies/mL. A negative result does not preclude SARS-Cov-2 infection and should not be used as the sole basis for treatment or other patient management decisions. A negative result may occur with  improper specimen collection/handling, submission of specimen other than nasopharyngeal swab, presence of viral mutation(s) within the areas targeted by this assay, and inadequate number of viral copies(<138 copies/mL). A negative result must be combined with clinical observations, patient history, and epidemiological information. The expected result is Negative.  Fact Sheet for Patients:  bloggercourse.com  Fact Sheet for Healthcare Providers:  seriousbroker.it  This test is no t yet approved or cleared by the [place]  FDA and  has been authorized for detection and/or diagnosis of SARS-CoV-2 by FDA  under an Emergency Use Authorization (EUA). This EUA will remain  in effect (meaning this test can be used) for the duration of the COVID-19 declaration under Section 564(b)(1) of the Act, 21 U.S.C.section 360bbb-3(b)(1), unless the authorization is terminated  or revoked sooner.       Influenza A by PCR NEGATIVE NEGATIVE Final   Influenza B by PCR NEGATIVE NEGATIVE Final    Comment: (NOTE) The Xpert Xpress SARS-CoV-2/FLU/RSV plus assay is intended as an aid in the diagnosis of influenza from Nasopharyngeal swab specimens and should not be used as a sole basis for treatment. Nasal washings and aspirates are unacceptable for Xpert Xpress SARS-CoV-2/FLU/RSV testing.  Fact Sheet for Patients: bloggercourse.com  Fact Sheet for Healthcare Providers: seriousbroker.it  This test is not yet approved or cleared by the [place]  FDA and has been authorized for detection and/or diagnosis of SARS-CoV-2 by FDA under an Emergency Use Authorization (EUA). This EUA will remain in  effect (meaning this test can be used) for the duration of the COVID-19 declaration under Section 564(b)(1) of the Act, 21 U.S.C. section 360bbb-3(b)(1), unless the authorization is terminated or revoked.     Resp Syncytial Virus by PCR NEGATIVE NEGATIVE Final    Comment: (NOTE) Fact Sheet for Patients: bloggercourse.com  Fact Sheet for Healthcare Providers: seriousbroker.it  This test is not yet approved or cleared by the [place]  FDA and has been authorized for detection and/or diagnosis of SARS-CoV-2 by FDA under an Emergency Use Authorization (EUA). This EUA will remain in effect (meaning this test can be used) for the duration of the COVID-19 declaration under Section 564(b)(1) of the Act, 21 U.S.C. section 360bbb-3(b)(1), unless the authorization is terminated or revoked.  Performed at [facility], [address] [place], [place] [address]      Labs: BNP (last 3 results) No results for input(s): BNP in the last 8760 hours. Basic Metabolic Panel: Recent Labs  Lab 10/27/24 1610 10/29/24 0532  NA 140 139  K 3.6 3.4*  CL 102 103  CO2 25 27  GLUCOSE 104* 81  BUN 19 14  CREATININE 1.42* 1.13  CALCIUM 9.3 8.5*   Liver Function Tests: Recent Labs  Lab 10/27/24 1610  AST 21  ALT 12  ALKPHOS 82  BILITOT 0.9  PROT 7.5  ALBUMIN  4.3   Recent Labs  Lab 10/27/24 1610 10/29/24 0532  LIPASE 75* 25   No results for input(s): AMMONIA in the last 168 hours. CBC: Recent Labs  Lab 10/27/24 1610 10/29/24 0532  WBC 6.9 7.1  HGB 13.4 12.3*  HCT 41.0 37.8*  MCV 84.7 85.9  PLT 170 151   Cardiac Enzymes: No results for input(s): CKTOTAL, CKMB, CKMBINDEX, TROPONINI in the last 168 hours. BNP: Invalid input(s): POCBNP CBG: No results for input(s): GLUCAP in the last 168 hours. D-Dimer No results for input(s): DDIMER in the last 72 hours. Hgb A1c No results for input(s): HGBA1C in the last 72 hours. Lipid Profile Recent Labs    10/28/24 0121  TRIG 35   Thyroid  function studies No results for input(s): TSH, T4TOTAL, T3FREE, THYROIDAB in the last 72 hours.  Invalid input(s): FREET3 Anemia work up No results for input(s): VITAMINB12, FOLATE, FERRITIN, TIBC, IRON, RETICCTPCT in the last 72 hours. Urinalysis    Component Value Date/Time   COLORURINE YELLOW 10/27/2024 2150   APPEARANCEUR CLEAR 10/27/2024 2150   LABSPEC >1.046 (H) 10/27/2024 2150   PHURINE 6.0 10/27/2024 2150   GLUCOSEU NEGATIVE 10/27/2024 2150   GLUCOSEU NEGATIVE 03/30/2017 0855   HGBUR MODERATE (A) 10/27/2024 2150   BILIRUBINUR NEGATIVE 10/27/2024 2150   BILIRUBINUR Negative 08/05/2024 0908   KETONESUR TRACE (A) 10/27/2024 2150   PROTEINUR TRACE (A) 10/27/2024 2150   UROBILINOGEN negative (A) 08/05/2024 0908   UROBILINOGEN  0.2 03/30/2017 0855   NITRITE NEGATIVE 10/27/2024 2150   LEUKOCYTESUR NEGATIVE 10/27/2024 2150   Sepsis Labs Recent Labs  Lab 10/27/24 1610 10/29/24 0532  WBC 6.9 7.1   Microbiology Recent Results (from the past 240 hours)  Culture, blood (single)     Status: None (Preliminary result)   Collection Time: 10/27/24  9:03 PM   Specimen: BLOOD RIGHT HAND  Result Value Ref Range Status   Specimen Description   Final    BLOOD RIGHT HAND Performed at [facility], [address], [place], [place] [address]    Special Requests   Final    BOTTLES DRAWN AEROBIC AND ANAEROBIC Blood  Culture adequate volume Performed at [facility], [address] [place], [place] [address]    Culture   Final    NO GROWTH < 24 HOURS Performed at [facility], [address] [place], [place] [address]    Report Status PENDING  Incomplete  Resp panel by RT-PCR (RSV, Flu A&B, Covid) Anterior Nasal Swab     Status: None   Collection Time: 10/28/24 12:36 AM   Specimen: Anterior Nasal Swab  Result Value Ref Range Status   SARS Coronavirus 2 by RT PCR NEGATIVE NEGATIVE Final    Comment: (NOTE) SARS-CoV-2 target nucleic acids are NOT DETECTED.  The SARS-CoV-2 RNA is generally detectable in upper respiratory specimens during the acute phase of infection. The lowest concentration of SARS-CoV-2 viral copies this assay can detect is 138 copies/mL. A negative result does not preclude SARS-Cov-2 infection and should not be used as the sole basis for treatment or other patient management decisions. A negative result may occur with  improper specimen collection/handling, submission of specimen other than nasopharyngeal swab, presence of viral mutation(s) within the areas targeted by this assay, and inadequate number of viral copies(<138 copies/mL). A negative result must be combined with clinical observations, patient history, and epidemiological information. The expected result  is Negative.  Fact Sheet for Patients:  bloggercourse.com  Fact Sheet for Healthcare Providers:  seriousbroker.it  This test is no t yet approved or cleared by the [place]  FDA and  has been authorized for detection and/or diagnosis of SARS-CoV-2 by FDA under an Emergency Use Authorization (EUA). This EUA will remain  in effect (meaning this test can be used) for the duration of the COVID-19 declaration under Section 564(b)(1) of the Act, 21 U.S.C.section 360bbb-3(b)(1), unless the authorization is terminated  or revoked sooner.       Influenza A by PCR NEGATIVE NEGATIVE Final   Influenza B by PCR NEGATIVE NEGATIVE Final    Comment: (NOTE) The Xpert Xpress SARS-CoV-2/FLU/RSV plus assay is intended as an aid in the diagnosis of influenza from Nasopharyngeal swab specimens and should not be used as a sole basis for treatment. Nasal washings and aspirates are unacceptable for Xpert Xpress SARS-CoV-2/FLU/RSV testing.  Fact Sheet for Patients: bloggercourse.com  Fact Sheet for Healthcare Providers: seriousbroker.it  This test is not yet approved or cleared by the [place]  FDA and has been authorized for detection and/or diagnosis of SARS-CoV-2 by FDA under an Emergency Use Authorization (EUA). This EUA will remain in effect (meaning this test can be used) for the duration of the COVID-19 declaration under Section 564(b)(1) of the Act, 21 U.S.C. section 360bbb-3(b)(1), unless the authorization is terminated or revoked.     Resp Syncytial Virus by PCR NEGATIVE NEGATIVE Final    Comment: (NOTE) Fact Sheet for Patients: bloggercourse.com  Fact Sheet for Healthcare Providers: seriousbroker.it  This test is not yet approved or cleared by the [place]  FDA and has been authorized for detection and/or diagnosis of  SARS-CoV-2 by FDA under an Emergency Use Authorization (EUA). This EUA will remain in effect (meaning this test can be used) for the duration of the COVID-19 declaration under Section 564(b)(1) of the Act, 21 U.S.C. section 360bbb-3(b)(1), unless the authorization is terminated or revoked.  Performed at [facility], [address], [place], [place] [address]      Time coordinating discharge: 35 minutes  SIGNED:   Sophie Mao, MD  [facility] 10/29/2024, 11:46 AM

## 2024-10-29 NOTE — TOC Transition Note (Signed)
 Transition of Care [facility]) - Discharge Note   Patient Details  Name: Hector [name] MRN: [id number] Date of Birth: Mar 11, 1951  Transition of Care [facility]) CM/SW Contact:  Bascom Service, RN Phone Number: 10/29/2024, 2:10 PM   Clinical Narrative: d/c home no needs.      Final next level of care: Home/Self Care Barriers to Discharge: No Barriers Identified   Patient Goals and CMS Choice Patient states their goals for this hospitalization and ongoing recovery are:: Home CMS Medicare.gov Compare Post Acute Care list provided to:: Patient Choice offered to / list presented to : Patient [employer] ownership interest in [facility].provided to:: Patient    Discharge Placement                       Discharge Plan and Services Additional resources added to the After Visit Summary for     Discharge Planning Services: CM Consult                                 Social Drivers of Health (SDOH) Interventions SDOH Screenings   Food Insecurity: No Food Insecurity (10/28/2024)  Housing: Low Risk  (10/28/2024)  Transportation Needs: No Transportation Needs (10/28/2024)  Utilities: Not At Risk (10/28/2024)  Alcohol Screen: Low Risk  (01/16/2024)  Depression (PHQ2-9): Low Risk  (01/16/2024)  Financial Resource Strain: Low Risk  (08/01/2024)  Physical Activity: Sufficiently Active (08/01/2024)  Social Connections: Socially Integrated (10/28/2024)  Stress: No Stress Concern Present (08/01/2024)  Tobacco Use: Low Risk  (10/27/2024)  Health Literacy: Adequate Health Literacy (01/16/2024)     Readmission Risk Interventions     No data to display

## 2024-11-02 LAB — CULTURE, BLOOD (SINGLE)
Culture: NO GROWTH
Special Requests: ADEQUATE

## 2024-11-05 ENCOUNTER — Encounter: Payer: Self-pay | Admitting: Podiatry

## 2024-11-05 ENCOUNTER — Ambulatory Visit: Admitting: Podiatry

## 2024-11-05 VITALS — Ht 69.0 in | Wt 219.0 lb

## 2024-11-05 DIAGNOSIS — L600 Ingrowing nail: Secondary | ICD-10-CM

## 2024-11-05 NOTE — Progress Notes (Signed)
   Chief Complaint  Patient presents with   Nail Problem    RM 6 Patient is here to f/u on ingrown toe nail removal of the right hallux. Procedure site healing well, pt states no pain or discomfort.    Subjective: 73 y.o. male presents today status post permanent nail avulsion procedure of the medial border right great toe that was performed on 10/15/2024.  Doing well.  No new complaints.   Past Medical History:  Diagnosis Date   Adhesive capsulitis 07/31/2018   Allergy    Arthritis    Bladder cancer (HCC)    Chronic kidney disease    kidney stones   GERD (gastroesophageal reflux disease)    History of kidney stones    Hyperlipidemia    Hyperparathyroidism    Hypertension     Objective: Neurovascular status intact.  Skin is warm, dry and supple. Nail and respective nail fold appears to be healing appropriately.   Assessment: #1 s/p partial permanent nail matrixectomy medial border right great toe   Plan of care: #1 patient was evaluated  #2 light debridement of the periungual debris was performed to the border of the respective toe and nail plate using a tissue nipper. #3 patient is to return to clinic on a PRN basis.   Thresa EMERSON Sar, DPM [facility]  Dr. Thresa EMERSON Sar, DPM    [address] [place], [place] [address]                Office [phone]  Fax [phone]

## 2024-11-10 ENCOUNTER — Ambulatory Visit: Payer: Self-pay | Admitting: Internal Medicine

## 2024-11-10 ENCOUNTER — Ambulatory Visit: Admitting: Internal Medicine

## 2024-11-10 ENCOUNTER — Encounter: Payer: Self-pay | Admitting: Internal Medicine

## 2024-11-10 VITALS — BP 132/80 | HR 64 | Temp 97.8°F | Ht 69.0 in | Wt 223.4 lb

## 2024-11-10 DIAGNOSIS — K8500 Idiopathic acute pancreatitis without necrosis or infection: Secondary | ICD-10-CM

## 2024-11-10 LAB — COMPREHENSIVE METABOLIC PANEL WITH GFR
ALT: 14 U/L (ref 0–53)
AST: 13 U/L (ref 0–37)
Albumin: 3.9 g/dL (ref 3.5–5.2)
Alkaline Phosphatase: 60 U/L (ref 39–117)
BUN: 24 mg/dL — ABNORMAL HIGH (ref 6–23)
CO2: 29 meq/L (ref 19–32)
Calcium: 8.9 mg/dL (ref 8.4–10.5)
Chloride: 106 meq/L (ref 96–112)
Creatinine, Ser: 1.2 mg/dL (ref 0.40–1.50)
GFR: 60.69 mL/min (ref >60.00)
Glucose, Bld: 103 mg/dL — ABNORMAL HIGH (ref 70–99)
Potassium: 3.8 meq/L (ref 3.5–5.1)
Sodium: 143 meq/L (ref 135–145)
Total Bilirubin: 0.7 mg/dL (ref 0.2–1.2)
Total Protein: 6.8 g/dL (ref 6.0–8.3)

## 2024-11-10 LAB — CBC
HCT: 39.2 % (ref 39.0–52.0)
Hemoglobin: 12.9 g/dL — ABNORMAL LOW (ref 13.0–17.0)
MCHC: 33 g/dL (ref 30.0–36.0)
MCV: 84.4 fl (ref 78.0–100.0)
Platelets: 242 K/uL (ref 150.0–400.0)
RBC: 4.64 Mil/uL (ref 4.22–5.81)
RDW: 15.6 % — ABNORMAL HIGH (ref 11.5–15.5)
WBC: 4.3 K/uL (ref 4.0–10.5)

## 2024-11-10 LAB — LIPASE: Lipase: 15 U/L (ref 11.0–59.0)

## 2024-11-10 NOTE — Patient Instructions (Addendum)
 We will check the labs today. l

## 2024-11-10 NOTE — Progress Notes (Signed)
   Subjective:   Patient ID: Hector [name], male    DOB: 1951/04/18, 73 y.o.   MRN: [id number]  The patient is a 73 YO man coming in for hospital follow up (Admitted for pancreatitis without clear trigger with improvement with pain management and fluids). He was doing well from Wed-Saturday with small advancement in diet. Then Friday ate salmon and salad and worsening Saturday. This improved some on Sunday and unsure about today so far. Yesterday eating toast hurt stomach and gave midline stomach pain to back and nausea with some diarrhea. Denies blood in stool. Denies vomiting.   PMH, [facility], social history reviewed and updated.  Discharge medication reconciliation done and updated as appropriate.   Review of Systems  Constitutional: Negative.   HENT: Negative.    Eyes: Negative.   Respiratory:  Negative for cough, chest tightness and shortness of breath.   Cardiovascular:  Negative for chest pain, palpitations and leg swelling.  Gastrointestinal:  Positive for abdominal distention, diarrhea and nausea. Negative for abdominal pain, constipation and vomiting.  Musculoskeletal: Negative.   Skin: Negative.   Neurological: Negative.   Psychiatric/Behavioral: Negative.      Objective:  Physical Exam Constitutional:      Appearance: He is well-developed.  HENT:     Head: Normocephalic and atraumatic.  Cardiovascular:     Rate and Rhythm: Normal rate and regular rhythm.  Pulmonary:     Effort: Pulmonary effort is normal. No respiratory distress.     Breath sounds: Normal breath sounds. No wheezing or rales.  Abdominal:     General: Bowel sounds are normal. There is no distension.     Palpations: Abdomen is soft.     Tenderness: There is abdominal tenderness.     Comments: No rebound or guarding but tenderness midline epigastric region to palpation  Musculoskeletal:     Cervical back: Normal range of motion.  Skin:    General: Skin is warm and dry.  Neurological:     Mental  Status: He is alert and oriented to person, place, and time.     Coordination: Coordination normal.     Vitals:   11/10/24 0957  BP: 132/80  Pulse: 64  Temp: 97.8 F (36.6 C)  TempSrc: Oral  SpO2: 99%  Weight: 223 lb 6.4 oz (101.3 kg)  Height: 5' 9 (1.753 m)  I personally spent a total of 35 minutes in the care of the patient today including preparing to see the patient, getting/reviewing separately obtained history, performing a medically appropriate exam/evaluation, counseling and educating, and placing orders.   A/P

## 2024-11-10 NOTE — Assessment & Plan Note (Signed)
 With worsening symptoms over the weekend. Checking CBC, CMP, lipase. He is eating small bits and liquids. Asked him to return to liquid diet for 1-2 days then soft bland foods for a week or so then gradually advance diet. If labs worse can adjust plan. No traditional risk factors. Education given at etiology of pancreatitis and course as well as warning symptoms.

## 2025-01-07 ENCOUNTER — Encounter: Payer: Self-pay | Admitting: Gastroenterology

## 2025-01-09 ENCOUNTER — Other Ambulatory Visit: Payer: Self-pay | Admitting: Medical Genetics

## 2025-01-16 ENCOUNTER — Ambulatory Visit: Payer: Medicare Other

## 2025-01-19 ENCOUNTER — Other Ambulatory Visit

## 2025-02-05 ENCOUNTER — Ambulatory Visit: Admitting: Gastroenterology
